# Patient Record
Sex: Female | Born: 1970 | Race: Black or African American | Hispanic: No | Smoking: Never smoker
Health system: Southern US, Community
[De-identification: ages and names within clinical notes are randomized; demographics above are authoritative.]

## PROBLEM LIST (undated history)

## (undated) DIAGNOSIS — Z87440 Personal history of urinary (tract) infections: Secondary | ICD-10-CM

## (undated) DIAGNOSIS — Z8742 Personal history of other diseases of the female genital tract: Secondary | ICD-10-CM

## (undated) DIAGNOSIS — I10 Essential (primary) hypertension: Secondary | ICD-10-CM

## (undated) DIAGNOSIS — R112 Nausea with vomiting, unspecified: Secondary | ICD-10-CM

## (undated) DIAGNOSIS — B009 Herpesviral infection, unspecified: Secondary | ICD-10-CM

## (undated) DIAGNOSIS — F32A Depression, unspecified: Secondary | ICD-10-CM

## (undated) DIAGNOSIS — T7840XA Allergy, unspecified, initial encounter: Secondary | ICD-10-CM

## (undated) DIAGNOSIS — D219 Benign neoplasm of connective and other soft tissue, unspecified: Secondary | ICD-10-CM

## (undated) DIAGNOSIS — N762 Acute vulvitis: Secondary | ICD-10-CM

## (undated) DIAGNOSIS — E785 Hyperlipidemia, unspecified: Secondary | ICD-10-CM

## (undated) DIAGNOSIS — B379 Candidiasis, unspecified: Secondary | ICD-10-CM

## (undated) DIAGNOSIS — Z9889 Other specified postprocedural states: Secondary | ICD-10-CM

## (undated) DIAGNOSIS — B977 Papillomavirus as the cause of diseases classified elsewhere: Secondary | ICD-10-CM

## (undated) DIAGNOSIS — E119 Type 2 diabetes mellitus without complications: Secondary | ICD-10-CM

## (undated) HISTORY — DX: Herpesviral infection, unspecified: B00.9

## (undated) HISTORY — DX: Essential (primary) hypertension: I10

## (undated) HISTORY — DX: Papillomavirus as the cause of diseases classified elsewhere: B97.7

## (undated) HISTORY — DX: Personal history of urinary (tract) infections: Z87.440

## (undated) HISTORY — DX: Type 2 diabetes mellitus without complications: E11.9

## (undated) HISTORY — DX: Allergy, unspecified, initial encounter: T78.40XA

## (undated) HISTORY — DX: Hyperlipidemia, unspecified: E78.5

## (undated) HISTORY — PX: TUBAL LIGATION: SHX77

## (undated) HISTORY — DX: Acute vulvitis: N76.2

## (undated) HISTORY — DX: Candidiasis, unspecified: B37.9

## (undated) HISTORY — DX: Benign neoplasm of connective and other soft tissue, unspecified: D21.9

## (undated) HISTORY — PX: ABDOMINAL HYSTERECTOMY: SHX81

## (undated) HISTORY — DX: Personal history of other diseases of the female genital tract: Z87.42

---

## 2003-08-27 DIAGNOSIS — D219 Benign neoplasm of connective and other soft tissue, unspecified: Secondary | ICD-10-CM

## 2003-08-27 HISTORY — DX: Benign neoplasm of connective and other soft tissue, unspecified: D21.9

## 2003-08-27 HISTORY — PX: MYOMECTOMY: SHX85

## 2007-08-27 DIAGNOSIS — B009 Herpesviral infection, unspecified: Secondary | ICD-10-CM

## 2007-08-27 HISTORY — PX: CRYOTHERAPY: SHX1416

## 2007-08-27 HISTORY — DX: Herpesviral infection, unspecified: B00.9

## 2010-06-19 DIAGNOSIS — B379 Candidiasis, unspecified: Secondary | ICD-10-CM

## 2010-06-19 HISTORY — DX: Candidiasis, unspecified: B37.9

## 2011-01-16 DIAGNOSIS — Z8742 Personal history of other diseases of the female genital tract: Secondary | ICD-10-CM

## 2011-01-16 HISTORY — DX: Personal history of other diseases of the female genital tract: Z87.42

## 2011-02-04 DIAGNOSIS — N762 Acute vulvitis: Secondary | ICD-10-CM

## 2011-02-04 HISTORY — DX: Acute vulvitis: N76.2

## 2011-05-06 DIAGNOSIS — Z87440 Personal history of urinary (tract) infections: Secondary | ICD-10-CM

## 2011-05-06 HISTORY — DX: Personal history of urinary (tract) infections: Z87.440

## 2011-11-27 ENCOUNTER — Other Ambulatory Visit: Payer: Self-pay | Admitting: Internal Medicine

## 2011-11-27 DIAGNOSIS — Z1231 Encounter for screening mammogram for malignant neoplasm of breast: Secondary | ICD-10-CM

## 2011-12-18 ENCOUNTER — Ambulatory Visit (INDEPENDENT_AMBULATORY_CARE_PROVIDER_SITE_OTHER): Payer: Commercial Managed Care - PPO | Admitting: Obstetrics and Gynecology

## 2011-12-18 ENCOUNTER — Ambulatory Visit: Payer: Self-pay

## 2011-12-18 ENCOUNTER — Encounter: Payer: Self-pay | Admitting: Obstetrics and Gynecology

## 2011-12-18 VITALS — BP 100/62 | HR 72 | Resp 16 | Ht 66.0 in | Wt 156.0 lb

## 2011-12-18 DIAGNOSIS — N920 Excessive and frequent menstruation with regular cycle: Secondary | ICD-10-CM

## 2011-12-18 DIAGNOSIS — N921 Excessive and frequent menstruation with irregular cycle: Secondary | ICD-10-CM

## 2011-12-18 DIAGNOSIS — D250 Submucous leiomyoma of uterus: Secondary | ICD-10-CM

## 2011-12-18 LAB — TSH: TSH: 1.047 u[IU]/mL (ref 0.350–4.500)

## 2011-12-18 LAB — CBC
Hemoglobin: 14.2 g/dL (ref 12.0–15.0)
MCV: 91.5 fL (ref 78.0–100.0)
Platelets: 286 10*3/uL (ref 150–400)
RBC: 4.6 MIL/uL (ref 3.87–5.11)
WBC: 2.9 10*3/uL — ABNORMAL LOW (ref 4.0–10.5)

## 2011-12-18 MED ORDER — VALACYCLOVIR HCL 500 MG PO TABS: 500.0000 mg | ORAL_TABLET | Freq: Two times a day (BID) | ORAL | Status: AC

## 2011-12-18 MED ORDER — MEDROXYPROGESTERONE ACETATE 10 MG PO TABS: 10.0000 mg | ORAL_TABLET | Freq: Every day | ORAL | Status: DC

## 2011-12-18 NOTE — Patient Instructions (Signed)
Patient Education Materials to be provided at check out (*indicates is located in accordion folder):  Endometrial Ablation and Hysterectomy

## 2011-12-18 NOTE — Progress Notes (Signed)
Addended by: Osborn Coho on: 12/18/2011 10:30 AM   Modules accepted: Orders

## 2011-12-18 NOTE — Progress Notes (Addendum)
C/o 2 cycles this month.  This is the first time this has happened.  She had c/o menorrhagia last yr and sonohyst showed 2 small submucosal fibroids.  I discussed doing Em Bx today to finish w/u and will repeat labs.  PSH C/S x2.  Filed Vitals:   12/18/11 0900  BP: 100/62  Pulse: 72  Resp: 16   Ext genitalia wnl Nl size uterus No palpable masses EmBx sound to 8cm (performed per protocol) pipelle passed x 3  A/P Em Bx today per protocol Pap done Check tsh and cbc Options reviewed.  Pt wants TL[facility] but considering ablation. Schedule [facility] Provera qd for cycle control Refill Valtrex pt request

## 2011-12-19 LAB — PAP IG W/ RFLX HPV ASCU

## 2011-12-26 ENCOUNTER — Telehealth: Payer: Self-pay | Admitting: Obstetrics and Gynecology

## 2012-01-16 ENCOUNTER — Other Ambulatory Visit: Payer: Self-pay | Admitting: Obstetrics and Gynecology

## 2012-01-16 ENCOUNTER — Telehealth: Payer: Self-pay | Admitting: Obstetrics and Gynecology

## 2012-01-16 NOTE — Telephone Encounter (Signed)
[facility] Cystoscopy Scheduled for 03/16/12 @ 12:30 with AR/ND. [employer] effective 10/24/10. Plan pays 80/20 After a $1,000 deductible.  Pre-op due $339.65  -Yvonne [name]

## 2012-03-06 ENCOUNTER — Encounter: Payer: Commercial Managed Care - PPO | Admitting: Obstetrics and Gynecology

## 2012-03-10 ENCOUNTER — Encounter (HOSPITAL_COMMUNITY)
Admission: RE | Admit: 2012-03-10 | Discharge: 2012-03-10 | Disposition: A | Discharge status: Home / Self Care | Payer: 59 | Source: Ambulatory Visit | Attending: Obstetrics and Gynecology | Admitting: Obstetrics and Gynecology

## 2012-03-10 ENCOUNTER — Encounter (HOSPITAL_COMMUNITY): Payer: Self-pay

## 2012-03-10 DIAGNOSIS — Z01818 Encounter for other preprocedural examination: Secondary | ICD-10-CM | POA: Insufficient documentation

## 2012-03-10 DIAGNOSIS — Z01812 Encounter for preprocedural laboratory examination: Secondary | ICD-10-CM | POA: Insufficient documentation

## 2012-03-10 HISTORY — DX: Other specified postprocedural states: R11.2

## 2012-03-10 HISTORY — DX: Nausea with vomiting, unspecified: R11.2

## 2012-03-10 HISTORY — DX: Nausea with vomiting, unspecified: Z98.890

## 2012-03-10 LAB — SURGICAL PCR SCREEN
MRSA, PCR: NEGATIVE
Staphylococcus aureus: NEGATIVE

## 2012-03-10 LAB — CBC
HCT: 42.6 % (ref 36.0–46.0)
Hemoglobin: 14.4 g/dL (ref 12.0–15.0)
MCHC: 33.8 g/dL (ref 30.0–36.0)
RBC: 4.76 MIL/uL (ref 3.87–5.11)
WBC: 4.9 10*3/uL (ref 4.0–10.5)

## 2012-03-10 NOTE — Patient Instructions (Addendum)
YOUR PROCEDURE IS SCHEDULED ON:03/16/12  ENTER THROUGH THE MAIN ENTRANCE OF [facility] AT:11AM  USE DESK PHONE AND DIAL [phone] TO INFORM us OF YOUR ARRIVAL  CALL [phone] IF YOU HAVE ANY QUESTIONS OR PROBLEMS PRIOR TO YOUR ARRIVAL.  REMEMBER: DO NOT EAT AFTER MIDNIGHT :SUNDAY   SPECIAL INSTRUCTIONS:CLEAR LIQUIDS OK UNTIL 0830 AM MONDAY   YOU MAY BRUSH YOUR TEETH THE MORNING OF SURGERY   TAKE THESE MEDICINES THE DAY OF SURGERY WITH SIP OF WATER:NONE   DO NOT WEAR JEWELRY, EYE MAKEUP, LIPSTICK OR DARK FINGERNAIL POLISH DO NOT WEAR LOTIONS  DO NOT SHAVE FOR 48 HOURS PRIOR TO SURGERY  YOU WILL NOT BE ALLOWED TO DRIVE YOURSELF HOME.

## 2012-03-11 ENCOUNTER — Encounter: Payer: Self-pay | Admitting: Obstetrics and Gynecology

## 2012-03-11 ENCOUNTER — Ambulatory Visit (INDEPENDENT_AMBULATORY_CARE_PROVIDER_SITE_OTHER): Payer: 59 | Admitting: Obstetrics and Gynecology

## 2012-03-11 VITALS — HR 64 | Temp 98.9°F | Resp 14 | Ht 65.0 in | Wt 154.0 lb

## 2012-03-11 DIAGNOSIS — Z01818 Encounter for other preprocedural examination: Secondary | ICD-10-CM

## 2012-03-11 DIAGNOSIS — N920 Excessive and frequent menstruation with regular cycle: Secondary | ICD-10-CM

## 2012-03-11 DIAGNOSIS — D250 Submucous leiomyoma of uterus: Secondary | ICD-10-CM

## 2012-03-11 DIAGNOSIS — N921 Excessive and frequent menstruation with irregular cycle: Secondary | ICD-10-CM

## 2012-03-11 MED ORDER — AZITHROMYCIN 250 MG PO TABS: ORAL_TABLET | ORAL | Status: AC

## 2012-03-11 NOTE — Progress Notes (Signed)
Yvonne [name] is a 41 y.o. female G2P2 who presents for a total laparoscopic hysterectomy because of menometrorrhagia and submucosal fibroids. Patient has a long history of symptomatic uterine fibroids and underwent an abdominal myomectomy in 2005.  Over the past year patient reports vaginal bleeding twice a month that only allow about one week without a flow.  During these episodes she may change a pad once or every 3 hours (using a super plus tampon).  She admits to cramping rated at a 7/10 but finds relief (3/10) with Midol or Aleve.  She admits to occasional positional dyspareunia but denies vaginitis symptoms, changes in urinary or bowel habits or dizziness.  A CBC and TSH done July /2013 were both normal.  An endometrial biopsy in April 2013 returned benign findings with no hyperplasia, atypia or malignancy.  A year ago patient had an ultrasound and sono-hysterogram that showed: the uterus measuring 8.47 X space 4.93X space 4.35 space cm with 2 submucosal fibroids measuring 1.05 cm and 0.91 cm respectively.  Both of the patient's ovaries appeared normal on this study and at that time an IUD was visible within the endometrial cavity.  A review of both medical and surgical management options were given to the patient regarding her symptoms. Given that she has completed her desire to bear children and the disruptive nature of her symptoms she has consented to proceed with hysterectomy.  Past Medical History  OB History: G2P2;  C-section 2006 & 2009  GYN History: menarche 41 YO   LMP  03/02/12   Contracepton-Tubal Sterilization;  Has a history of HSV-2 and HPV; Underwent cryotherapy in 2010 for abnormal PAP smear but they have been normal since.   Last PAP smear  2011  Medical History: migraines, fibroids  Surgical History: 2005 Myomectomy; 2009 Tubal Sterilization Admits to severe nausea and tremors with anesthesia  but no history of blood transfusions  Family History: diabetes mellitus,  hypertension  Social History: Divorced, works part-time with [employer];  Occasional alcohol consumption but denies tobacco or illicit drugs   Outpatient Encounter Prescriptions as of 03/11/2012  Medication Sig Dispense Refill  . niacin-simvastatin (SIMCOR) 500-20 MG 24 hr tablet Take 1 tablet by mouth at bedtime.        Allergies  Allergen Reactions  . Sulfa Antibiotics Itching   Denies sensitivity to latex, peanuts, shellfish, soy or adhesives.  ROS: + glasses for reading, current disruptive cough (following a recent sinus infection) that is "wet" but not productive,  + thick  post nasal drainage but  denies headache, vision changes, dysphagia, tinnitus, dizziness,  chest pain, shortness of breath, nausea, vomiting, diarrhea, dysuria, hematuria, pelvic pain, swelling of joints,easy bruising,  myalgias, arthralgias, skin rashes and except as is mentioned in the history of present illness, patient's review of systems is otherwise negative   Physical Exam    BP 112/70  Pulse 64  Temp 98.9 F (37.2 C)  Resp 14  Ht 5\' 5"  (1.651 m)  Wt 154 lb (69.854 kg)  BMI 25.63 kg/m2  LMP 03/02/2012  Neck: supple without masses or thyromegaly Lungs: clear to auscultation but coarse breath sounds Heart: regular rate and rhythm Abdomen: soft, non-tender and no organomegaly Pelvic:EGBUS- wnl; vagina-normal rugae; uterus-normal size, cervix without lesions or motion tenderness; adnexae-no tenderness or masses Extremities:  no clubbing, cyanosis or edema   Assesment: Menometrorrhagia                     Submucosal Fibroid   Disposition:  A discussion was held with patient regarding the indication for her procedure(s) along with the risks, which include but are not limited to: reaction to anesthesia, damage to adjacent organs, infection,  excessive bleeding, early menopause, pelvic prolapse and possible need for an open abdominal incision.  A Miralax bowel prep was given to patient to  complete 24 hours prior to surgery.  Patient verbalized understanding of her risks, along with the procedure and preoperative instructions given.  She has consented to proceed to a Total Laparoscopic Hysterectomy with Cystoscopy at [facility] March 16, 2012 @ 12:30 p.m   CSN# [id number]   Marquis Lunch. Lowell Guitar, PA-C  for Dr. Su Hilt   Addendum:  Patient was given Z-Pak  #6 2 po stat then 1 po qd until completed for her cough and advised to increase liquids and use saline nasal spray to help decrease the tenacity of her post nasal drip.

## 2012-03-11 NOTE — H&P (Signed)
Yvonne [name] is a 41 y.o. female G2P2 who presents for a total laparoscopic hysterectomy because of menometrorrhagia and submucosal fibroids. Patient has a long history of symptomatic uterine fibroids and underwent an abdominal myomectomy in 2005.  Over the past year patient reports vaginal bleeding twice a month that only allow about one week without a flow.  During these episodes she may change a pad once or every 3 hours (using a super plus tampon).  She admits to cramping rated at a 7/10 but finds relief (3/10) with Midol or Aleve.  She admits to occasional positional dyspareunia but denies vaginitis symptoms, changes in urinary or bowel habits or dizziness.  A CBC and TSH done July /2013 were both normal.  An endometrial biopsy in April 2013 returned benign findings with no hyperplasia, atypia or malignancy.  A year ago patient had an ultrasound and sono-hysterogram that showed: the uterus measuring 8.47 X space 4.93X space 4.35 space cm with 2 submucosal fibroids measuring 1.05 cm and 0.91 cm respectively.  Both of the patient's ovaries appeared normal on this study and at that time an IUD was visible within the endometrial cavity.  A review of both medical and surgical management options were given to the patient regarding her symptoms. Given that she has completed her desire to bear children and the disruptive nature of her symptoms she has consented to proceed with hysterectomy.  Past Medical History  OB History: G2P2;  C-section 2006 & 2009  GYN History: menarche 41 YO   LMP  03/02/12   Contracepton-Tubal Sterilization;  Has a history of HSV-2 and HPV; Underwent cryotherapy in 2010 for abnormal PAP smear but they have been normal since.   Last PAP smear  2011  Medical History: migraines, fibroids  Surgical History: 2005 Myomectomy; 2009 Tubal Sterilization Admits to severe nausea and tremors with anesthesia  but no history of blood transfusions  Family History: diabetes mellitus,  hypertension  Social History: Divorced, works part-time with [employer];  Occasional alcohol consumption but denies tobacco or illicit drugs   Outpatient Encounter Prescriptions as of 03/11/2012  Medication Sig Dispense Refill  . niacin-simvastatin (SIMCOR) 500-20 MG 24 hr tablet Take 1 tablet by mouth at bedtime.        Allergies  Allergen Reactions  . Sulfa Antibiotics Itching   Denies sensitivity to latex, peanuts, shellfish, soy or adhesives.  ROS: + glasses for reading, current disruptive cough (following a recent sinus infection) that is "wet" but not productive,  + thick  post nasal drainage but  denies headache, vision changes, dysphagia, tinnitus, dizziness,  chest pain, shortness of breath, nausea, vomiting, diarrhea, dysuria, hematuria, pelvic pain, swelling of joints,easy bruising,  myalgias, arthralgias, skin rashes and except as is mentioned in the history of present illness, patient's review of systems is otherwise negative   Physical Exam    BP 112/70  Pulse 64  Temp 98.9 F (37.2 C)  Resp 14  Ht 5\' 5"  (1.651 m)  Wt 154 lb (69.854 kg)  BMI 25.63 kg/m2  LMP 03/02/2012  Neck: supple without masses or thyromegaly Lungs: clear to auscultation but coarse breath sounds Heart: regular rate and rhythm Abdomen: soft, non-tender and no organomegaly Pelvic:EGBUS- wnl; vagina-normal rugae; uterus-normal size, cervix without lesions or motion tenderness; adnexae-no tenderness or masses Extremities:  no clubbing, cyanosis or edema   Assesment: Menometrorrhagia                     Submucosal Fibroid   Disposition:  A discussion was held with patient regarding the indication for her procedure(s) along with the risks, which include but are not limited to: reaction to anesthesia, damage to adjacent organs, infection,  excessive bleeding, early menopause, pelvic prolapse and possible need for an open abdominal incision.  A Miralax bowel prep was given to patient to  complete 24 hours prior to surgery.  Patient verbalized understanding of her risks, along with the procedure and preoperative instructions given.  She has consented to proceed to a Total Laparoscopic Hysterectomy with Cystoscopy at [facility] March 16, 2012 @ 12:30 p.m   CSN# [id number]   Marquis Lunch. Lowell Guitar, PA-C for Dr. Purcell Nails, M.D.

## 2012-03-14 ENCOUNTER — Telehealth: Payer: Self-pay | Admitting: Obstetrics and Gynecology

## 2012-03-14 NOTE — Telephone Encounter (Signed)
Will notify Dr.Roberts Lavera Guise, CNM

## 2012-03-15 MED ORDER — DEXTROSE 5 % IV SOLN
2.0000 g | INTRAVENOUS | Status: AC
  Filled 2012-03-15: qty 2

## 2012-03-24 ENCOUNTER — Telehealth: Payer: Self-pay | Admitting: Obstetrics and Gynecology

## 2012-03-24 ENCOUNTER — Other Ambulatory Visit: Payer: Self-pay | Admitting: Nurse Practitioner

## 2012-03-24 DIAGNOSIS — M79606 Pain in leg, unspecified: Secondary | ICD-10-CM

## 2012-03-24 NOTE — Telephone Encounter (Signed)
[facility] Cystoscopy Scheduled for 06/15/12 @ 9:30 with AR/ND. [employer] effective 10/24/10. Plan pays 80/20 After a $1,000 deductible.  Pre-op due $339.65  -Adrianne Pridgen

## 2012-03-26 ENCOUNTER — Ambulatory Visit
Admission: RE | Admit: 2012-03-26 | Discharge: 2012-03-26 | Disposition: A | Discharge status: Home / Self Care | Payer: 59 | Source: Ambulatory Visit | Attending: Nurse Practitioner | Admitting: Nurse Practitioner

## 2012-03-26 DIAGNOSIS — M79606 Pain in leg, unspecified: Secondary | ICD-10-CM

## 2012-04-16 ENCOUNTER — Other Ambulatory Visit: Payer: Self-pay | Admitting: Obstetrics and Gynecology

## 2012-04-23 ENCOUNTER — Encounter: Payer: Commercial Managed Care - PPO | Admitting: Obstetrics and Gynecology

## 2012-05-20 ENCOUNTER — Telehealth: Payer: Self-pay | Admitting: Obstetrics and Gynecology

## 2012-06-02 ENCOUNTER — Other Ambulatory Visit: Payer: Self-pay

## 2012-06-02 NOTE — Telephone Encounter (Signed)
Fax refill okd ld

## 2012-06-08 ENCOUNTER — Encounter (HOSPITAL_COMMUNITY)
Admission: RE | Admit: 2012-06-08 | Discharge: 2012-06-08 | Disposition: A | Discharge status: Home / Self Care | Payer: 59 | Source: Ambulatory Visit | Attending: Obstetrics and Gynecology | Admitting: Obstetrics and Gynecology

## 2012-06-08 ENCOUNTER — Encounter (HOSPITAL_COMMUNITY): Payer: Self-pay

## 2012-06-08 ENCOUNTER — Encounter (INDEPENDENT_AMBULATORY_CARE_PROVIDER_SITE_OTHER): Payer: 59 | Admitting: Obstetrics and Gynecology

## 2012-06-08 LAB — CBC
Hemoglobin: 13.7 g/dL (ref 12.0–15.0)
MCH: 30.1 pg (ref 26.0–34.0)
MCHC: 33.7 g/dL (ref 30.0–36.0)
MCV: 89.5 fL (ref 78.0–100.0)

## 2012-06-08 LAB — SURGICAL PCR SCREEN: MRSA, PCR: INVALID — AB

## 2012-06-08 NOTE — Patient Instructions (Addendum)
Your procedure is scheduled on:06/15/12  Enter through the Main Entrance at :0800 am Pick up desk phone and dial [phone] and inform us of your arrival.  Please call [phone] if you have any problems the morning of surgery.  Remember: Do not eat after midnight:Sunday  Do not drink after:5:30 am Monday  Take these meds the morning of surgery with a sip of water:none  DO NOT wear jewelry, eye make-up, lipstick,body lotion, or dark fingernail polish. Do not shave for 48 hours prior to surgery.  If you are to be admitted after surgery, leave suitcase in car until your room has been assigned. Patients discharged on the day of surgery will not be allowed to drive home.   Remember to use your Hibiclens as instructed.

## 2012-06-10 NOTE — H&P (Signed)
Yvonne [name] is a 41 y.o. female G2P2 who presents for a total laparoscopic hysterectomy because of menometrorrhagia and submucosal fibroids. Patient has a long history of symptomatic uterine fibroids and underwent an abdominal myomectomy in 2005. Over the past year patient reports vaginal bleeding twice a month that only allow about one week without a flow. During these episodes she may change a pad once or every 3 hours (using a super plus tampon). She admits to cramping rated at a 7/10 but finds relief (3/10) with Midol or Aleve. She admits to occasional positional dyspareunia but denies vaginitis symptoms, changes in urinary or bowel habits or dizziness. A CBC and TSH done July /2013 were both normal. An endometrial biopsy in April 2013 returned benign findings with no hyperplasia, atypia or malignancy. A year ago patient had an ultrasound and sono-hysterogram that showed: the uterus measuring 8.47 X space 4.93X space 4.35 space cm with 2 submucosal fibroids measuring 1.05 cm and 0.91 cm respectively. Both of the patient's ovaries appeared normal on this study and at that time an IUD was visible within the endometrial cavity.  A review of both medical and surgical management options were given to the patient regarding her symptoms. Given that she has completed her desire to bear children and the disruptive nature of her symptoms she has consented to proceed with hysterectomy.    Past Medical History   OB History: G2P2; C-section 2006 & 2009   GYN History: menarche 41 YO LMP 03/02/12 Contracepton-Tubal Sterilization; Has a history of HSV-2 and HPV; Underwent cryotherapy in 2010 for abnormal PAP smear but they have been normal since. Last PAP smear 2011   Medical History: migraines, fibroids   Surgical History: 2005 Myomectomy; 2009 Tubal Sterilization  Admits to severe nausea and tremors with anesthesia but no history of blood transfusions   Family History: diabetes mellitus, hypertension    Social History: Divorced, works part-time with Medicaid Billing; Occasional alcohol consumption but denies tobacco or illicit drugs      Outpatient Encounter Prescriptions as of 06/10/2012     Medication  Sig  Dispense  Refill     .  niacin-simvastatin (SIMCOR) 500-20 MG 24 hr tablet  Take 1 tablet by mouth at bedtime.       Allergies     Allergen  Reactions     .  Sulfa Antibiotics  Itching     Denies sensitivity to latex, peanuts, shellfish, soy or adhesives.   ROS: + glasses for reading, current disruptive cough (following a recent sinus infection) that is "wet" but not productive, + thick post nasal drainage but denies headache, vision changes, dysphagia, tinnitus, dizziness, chest pain, shortness of breath, nausea, vomiting, diarrhea, dysuria, hematuria, pelvic pain, swelling of joints,easy bruising, myalgias, arthralgias, skin rashes and except as is mentioned in the history of present illness, patient's review of systems is otherwise negative   Physical Exam  BP 112/70  Pulse 64  Temp 98.9 F (37.2 C)  Resp 14  Ht 5' 5" (1.651 m)  Wt 154 lb (69.854 kg)  BMI 25.63 kg/m2  LMP 03/02/2012  Neck: supple without masses or thyromegaly  Lungs: clear to auscultation but coarse breath sounds  Heart: regular rate and rhythm  Abdomen: soft, non-tender and no organomegaly  Pelvic:EGBUS- wnl; vagina-normal rugae; uterus-normal size, cervix without lesions or motion tenderness; adnexae-no tenderness or masses  Extremities: no clubbing, cyanosis or edema    Assesment: Menometrorrhagia  Submucosal Fibroid   Disposition: A discussion was held with patient regarding   the indication for her procedure(s) along with the risks, which include but are not limited to: reaction to anesthesia, damage to adjacent organs, infection, excessive bleeding, early menopause, pelvic prolapse and possible need for an open abdominal incision. A Miralax bowel prep was given to patient to complete 24 hours prior  to surgery. Patient verbalized understanding of her risks, along with the procedure and preoperative instructions given. She has consented to proceed to a Total Laparoscopic Hysterectomy with Cystoscopy at [facility] June 15, 2012 at 9:15 a.m.   CSN# [id number]  Kapena Hamme J. Zuley Lutter, PA-C for Dr. Angela Y Roberts, M.D.         

## 2012-06-10 NOTE — Progress Notes (Signed)
error 

## 2012-06-11 LAB — MRSA CULTURE

## 2012-06-15 ENCOUNTER — Encounter (HOSPITAL_COMMUNITY)
Admission: RE | Disposition: A | Discharge status: Home / Self Care | Payer: Self-pay | Source: Ambulatory Visit | Attending: Obstetrics and Gynecology

## 2012-06-15 ENCOUNTER — Encounter (HOSPITAL_COMMUNITY): Payer: Self-pay | Admitting: *Deleted

## 2012-06-15 ENCOUNTER — Ambulatory Visit (HOSPITAL_COMMUNITY)
Admission: RE | Admit: 2012-06-15 | Discharge: 2012-06-16 | Disposition: A | Discharge status: Home / Self Care | Payer: 59 | Source: Ambulatory Visit | Attending: Obstetrics and Gynecology | Admitting: Obstetrics and Gynecology

## 2012-06-15 ENCOUNTER — Encounter (HOSPITAL_COMMUNITY): Payer: Self-pay | Admitting: Anesthesiology

## 2012-06-15 ENCOUNTER — Ambulatory Visit (HOSPITAL_COMMUNITY): Payer: 59 | Admitting: Anesthesiology

## 2012-06-15 DIAGNOSIS — N8000 Endometriosis of the uterus, unspecified: Secondary | ICD-10-CM | POA: Insufficient documentation

## 2012-06-15 DIAGNOSIS — D251 Intramural leiomyoma of uterus: Secondary | ICD-10-CM | POA: Insufficient documentation

## 2012-06-15 DIAGNOSIS — N736 Female pelvic peritoneal adhesions (postinfective): Secondary | ICD-10-CM

## 2012-06-15 DIAGNOSIS — N920 Excessive and frequent menstruation with regular cycle: Secondary | ICD-10-CM | POA: Insufficient documentation

## 2012-06-15 DIAGNOSIS — Z01818 Encounter for other preprocedural examination: Secondary | ICD-10-CM | POA: Insufficient documentation

## 2012-06-15 DIAGNOSIS — Z01812 Encounter for preprocedural laboratory examination: Secondary | ICD-10-CM | POA: Insufficient documentation

## 2012-06-15 DIAGNOSIS — D250 Submucous leiomyoma of uterus: Secondary | ICD-10-CM | POA: Insufficient documentation

## 2012-06-15 HISTORY — PX: CYSTOSCOPY: SHX5120

## 2012-06-15 HISTORY — PX: LAPAROSCOPIC HYSTERECTOMY: SHX1926

## 2012-06-15 LAB — BASIC METABOLIC PANEL
CO2: 22 mEq/L (ref 19–32)
Calcium: 8.6 mg/dL (ref 8.4–10.5)
Chloride: 104 mEq/L (ref 96–112)
Potassium: 3.9 mEq/L (ref 3.5–5.1)
Sodium: 136 mEq/L (ref 135–145)

## 2012-06-15 LAB — HCG, SERUM, QUALITATIVE: Preg, Serum: NEGATIVE

## 2012-06-15 LAB — CBC
MCHC: 34.6 g/dL (ref 30.0–36.0)
Platelets: 210 10*3/uL (ref 150–400)
RDW: 11.7 % (ref 11.5–15.5)

## 2012-06-15 SURGERY — HYSTERECTOMY, TOTAL, LAPAROSCOPIC
Anesthesia: General | Site: Bladder | Wound class: Clean Contaminated

## 2012-06-15 MED ORDER — KETOROLAC TROMETHAMINE 30 MG/ML IJ SOLN
INTRAMUSCULAR | Status: AC
  Filled 2012-06-15: qty 1

## 2012-06-15 MED ORDER — INFLUENZA VIRUS VACC SPLIT PF IM SUSP
0.5000 mL | INTRAMUSCULAR | Status: AC
  Administered 2012-06-16: 0.5 mL via INTRAMUSCULAR
  Filled 2012-06-15: qty 0.5

## 2012-06-15 MED ORDER — ROCURONIUM BROMIDE 50 MG/5ML IV SOLN
INTRAVENOUS | Status: AC
  Filled 2012-06-15: qty 1

## 2012-06-15 MED ORDER — ROCURONIUM BROMIDE 100 MG/10ML IV SOLN
INTRAVENOUS | Status: DC | PRN
  Administered 2012-06-15: 20 mg via INTRAVENOUS
  Administered 2012-06-15: 10 mg via INTRAVENOUS
  Administered 2012-06-15: 50 mg via INTRAVENOUS

## 2012-06-15 MED ORDER — HYDROMORPHONE 0.3 MG/ML IV SOLN
INTRAVENOUS | Status: DC
  Administered 2012-06-15: 17:00:00 via INTRAVENOUS
  Administered 2012-06-15: 1.19 mg via INTRAVENOUS
  Administered 2012-06-16: 0.599 mg via INTRAVENOUS
  Filled 2012-06-15: qty 25

## 2012-06-15 MED ORDER — BUPIVACAINE HCL (PF) 0.25 % IJ SOLN
INTRAMUSCULAR | Status: DC | PRN
  Administered 2012-06-15: 19 mL

## 2012-06-15 MED ORDER — NEOSTIGMINE METHYLSULFATE 1 MG/ML IJ SOLN
INTRAMUSCULAR | Status: DC | PRN
  Administered 2012-06-15: 3 mg via INTRAVENOUS

## 2012-06-15 MED ORDER — STERILE WATER FOR IRRIGATION IR SOLN
Status: DC | PRN
  Administered 2012-06-15: 1000 mL via INTRAVESICAL

## 2012-06-15 MED ORDER — SCOPOLAMINE 1 MG/3DAYS TD PT72
MEDICATED_PATCH | TRANSDERMAL | Status: AC
  Administered 2012-06-15: 1.5 mg via TRANSDERMAL
  Filled 2012-06-15: qty 1

## 2012-06-15 MED ORDER — HYDROMORPHONE HCL PF 1 MG/ML IJ SOLN
0.2500 mg | INTRAMUSCULAR | Status: DC | PRN
  Administered 2012-06-15: 0.5 mg via INTRAVENOUS

## 2012-06-15 MED ORDER — LACTATED RINGERS IV SOLN
INTRAVENOUS | Status: DC
  Administered 2012-06-15: 125 mL/h via INTRAVENOUS
  Administered 2012-06-15 (×3): via INTRAVENOUS

## 2012-06-15 MED ORDER — METOCLOPRAMIDE HCL 5 MG/ML IJ SOLN
INTRAMUSCULAR | Status: AC
  Administered 2012-06-15: 10 mg via INTRAVENOUS
  Filled 2012-06-15: qty 2

## 2012-06-15 MED ORDER — SODIUM CHLORIDE 0.9 % IJ SOLN: 9.0000 mL | INTRAMUSCULAR | Status: DC | PRN

## 2012-06-15 MED ORDER — KETOROLAC TROMETHAMINE 30 MG/ML IJ SOLN
30.0000 mg | Freq: Four times a day (QID) | INTRAMUSCULAR | Status: DC
  Administered 2012-06-15: 30 mg via INTRAVENOUS

## 2012-06-15 MED ORDER — PROPOFOL 10 MG/ML IV EMUL
INTRAVENOUS | Status: AC
  Filled 2012-06-15: qty 20

## 2012-06-15 MED ORDER — FENTANYL CITRATE 0.05 MG/ML IJ SOLN
INTRAMUSCULAR | Status: DC | PRN
  Administered 2012-06-15 (×3): 100 ug via INTRAVENOUS
  Administered 2012-06-15 (×2): 50 ug via INTRAVENOUS

## 2012-06-15 MED ORDER — BUPIVACAINE HCL (PF) 0.25 % IJ SOLN
INTRAMUSCULAR | Status: AC
  Filled 2012-06-15: qty 30

## 2012-06-15 MED ORDER — HYDROMORPHONE HCL PF 1 MG/ML IJ SOLN
INTRAMUSCULAR | Status: AC
  Filled 2012-06-15: qty 1

## 2012-06-15 MED ORDER — FENTANYL CITRATE 0.05 MG/ML IJ SOLN
INTRAMUSCULAR | Status: AC
  Filled 2012-06-15: qty 5

## 2012-06-15 MED ORDER — LIDOCAINE HCL (CARDIAC) 20 MG/ML IV SOLN
INTRAVENOUS | Status: AC
  Filled 2012-06-15: qty 5

## 2012-06-15 MED ORDER — NALOXONE HCL 0.4 MG/ML IJ SOLN: 0.4000 mg | INTRAMUSCULAR | Status: DC | PRN

## 2012-06-15 MED ORDER — HYDROMORPHONE HCL PF 1 MG/ML IJ SOLN
INTRAMUSCULAR | Status: DC | PRN
  Administered 2012-06-15 (×2): 1 mg via INTRAVENOUS

## 2012-06-15 MED ORDER — DIPHENHYDRAMINE HCL 12.5 MG/5ML PO ELIX
12.5000 mg | ORAL_SOLUTION | Freq: Four times a day (QID) | ORAL | Status: DC | PRN
  Administered 2012-06-15: 12.5 mg via ORAL
  Filled 2012-06-15: qty 5

## 2012-06-15 MED ORDER — CEFAZOLIN SODIUM-DEXTROSE 2-3 GM-% IV SOLR
INTRAVENOUS | Status: AC
  Filled 2012-06-15: qty 50

## 2012-06-15 MED ORDER — DEXAMETHASONE SODIUM PHOSPHATE 10 MG/ML IJ SOLN
INTRAMUSCULAR | Status: AC
  Filled 2012-06-15: qty 1

## 2012-06-15 MED ORDER — MIDAZOLAM HCL 5 MG/5ML IJ SOLN
INTRAMUSCULAR | Status: DC | PRN
  Administered 2012-06-15: 2 mg via INTRAVENOUS

## 2012-06-15 MED ORDER — LACTATED RINGERS IV BOLUS (SEPSIS)
500.0000 mL | Freq: Once | INTRAVENOUS | Status: AC
  Administered 2012-06-15: 500 mL via INTRAVENOUS

## 2012-06-15 MED ORDER — DIPHENHYDRAMINE HCL 50 MG/ML IJ SOLN: 12.5000 mg | Freq: Once | INTRAMUSCULAR | Status: DC

## 2012-06-15 MED ORDER — SCOPOLAMINE 1 MG/3DAYS TD PT72
1.0000 | MEDICATED_PATCH | TRANSDERMAL | Status: DC
  Administered 2012-06-15: 1.5 mg via TRANSDERMAL

## 2012-06-15 MED ORDER — DIPHENHYDRAMINE HCL 50 MG/ML IJ SOLN
INTRAMUSCULAR | Status: AC
  Administered 2012-06-15: 12.5 mg via INTRAVENOUS
  Filled 2012-06-15: qty 1

## 2012-06-15 MED ORDER — INDIGOTINDISULFONATE SODIUM 8 MG/ML IJ SOLN
INTRAMUSCULAR | Status: AC
  Filled 2012-06-15: qty 5

## 2012-06-15 MED ORDER — ONDANSETRON HCL 4 MG/2ML IJ SOLN
INTRAMUSCULAR | Status: AC
  Filled 2012-06-15: qty 2

## 2012-06-15 MED ORDER — ONDANSETRON HCL 4 MG/2ML IJ SOLN
INTRAMUSCULAR | Status: DC | PRN
  Administered 2012-06-15: 4 mg via INTRAVENOUS

## 2012-06-15 MED ORDER — METOCLOPRAMIDE HCL 5 MG/ML IJ SOLN
10.0000 mg | Freq: Once | INTRAMUSCULAR | Status: AC | PRN
  Administered 2012-06-15: 10 mg via INTRAVENOUS

## 2012-06-15 MED ORDER — ONDANSETRON HCL 4 MG/2ML IJ SOLN
4.0000 mg | Freq: Four times a day (QID) | INTRAMUSCULAR | Status: DC | PRN
  Administered 2012-06-15 – 2012-06-16 (×2): 4 mg via INTRAVENOUS
  Filled 2012-06-15: qty 2

## 2012-06-15 MED ORDER — DEXAMETHASONE SODIUM PHOSPHATE 4 MG/ML IJ SOLN
INTRAMUSCULAR | Status: DC | PRN
  Administered 2012-06-15: 10 mg via INTRAVENOUS

## 2012-06-15 MED ORDER — OXYCODONE-ACETAMINOPHEN 5-325 MG PO TABS
1.0000 | ORAL_TABLET | ORAL | Status: DC | PRN
  Administered 2012-06-16: 2 via ORAL
  Filled 2012-06-15: qty 2

## 2012-06-15 MED ORDER — LIDOCAINE HCL (CARDIAC) 20 MG/ML IV SOLN
INTRAVENOUS | Status: DC | PRN
  Administered 2012-06-15: 100 mg via INTRAVENOUS

## 2012-06-15 MED ORDER — ONDANSETRON HCL 4 MG/2ML IJ SOLN
INTRAMUSCULAR | Status: AC
  Administered 2012-06-15: 4 mg via INTRAVENOUS
  Filled 2012-06-15: qty 2

## 2012-06-15 MED ORDER — PROPOFOL 10 MG/ML IV EMUL
INTRAVENOUS | Status: DC | PRN
  Administered 2012-06-15: 50 mg via INTRAVENOUS
  Administered 2012-06-15: 200 mg via INTRAVENOUS

## 2012-06-15 MED ORDER — DIPHENHYDRAMINE HCL 50 MG/ML IJ SOLN: 12.5000 mg | Freq: Four times a day (QID) | INTRAMUSCULAR | Status: DC | PRN

## 2012-06-15 MED ORDER — DIPHENHYDRAMINE HCL 50 MG/ML IJ SOLN
12.5000 mg | Freq: Once | INTRAMUSCULAR | Status: AC
  Administered 2012-06-15: 12.5 mg via INTRAVENOUS

## 2012-06-15 MED ORDER — IBUPROFEN 600 MG PO TABS
600.0000 mg | ORAL_TABLET | Freq: Four times a day (QID) | ORAL | Status: DC | PRN
  Administered 2012-06-16: 600 mg via ORAL
  Filled 2012-06-15: qty 1

## 2012-06-15 MED ORDER — INDIGOTINDISULFONATE SODIUM 8 MG/ML IJ SOLN
INTRAMUSCULAR | Status: DC | PRN
  Administered 2012-06-15: 3 mL via INTRAVENOUS

## 2012-06-15 MED ORDER — GLYCOPYRROLATE 0.2 MG/ML IJ SOLN
INTRAMUSCULAR | Status: DC | PRN
  Administered 2012-06-15: .4 mg via INTRAVENOUS

## 2012-06-15 MED ORDER — LACTATED RINGERS IV SOLN
INTRAVENOUS | Status: DC
  Administered 2012-06-15 – 2012-06-16 (×3): via INTRAVENOUS

## 2012-06-15 MED ORDER — CEFAZOLIN SODIUM-DEXTROSE 2-3 GM-% IV SOLR
2.0000 g | INTRAVENOUS | Status: AC
  Administered 2012-06-15: 2 g via INTRAVENOUS

## 2012-06-15 MED ORDER — MIDAZOLAM HCL 2 MG/2ML IJ SOLN
INTRAMUSCULAR | Status: AC
  Filled 2012-06-15: qty 2

## 2012-06-15 SURGICAL SUPPLY — 47 items
BARRIER ADHS 3X4 INTERCEED (GAUZE/BANDAGES/DRESSINGS) IMPLANT
BLADE SURG 10 STRL SS (BLADE) ×3 IMPLANT
CANISTER SUCTION 2500CC (MISCELLANEOUS) ×3 IMPLANT
CHLORAPREP W/TINT 26ML (MISCELLANEOUS) ×3 IMPLANT
CLOTH BEACON ORANGE TIMEOUT ST (SAFETY) ×3 IMPLANT
COVER MAYO STAND STRL (DRAPES) ×3 IMPLANT
COVER TABLE BACK 60X90 (DRAPES) ×3 IMPLANT
DERMABOND ADVANCED (GAUZE/BANDAGES/DRESSINGS) ×1
DERMABOND ADVANCED .7 DNX12 (GAUZE/BANDAGES/DRESSINGS) ×2 IMPLANT
DRAPE HYSTEROSCOPY (DRAPE) ×3 IMPLANT
DRAPE PROXIMA HALF (DRAPES) ×3 IMPLANT
EVACUATOR SMOKE 8.L (FILTER) ×6 IMPLANT
GLOVE BIO SURGEON STRL SZ 6.5 (GLOVE) ×6 IMPLANT
GLOVE BIO SURGEON STRL SZ7.5 (GLOVE) ×6 IMPLANT
GLOVE BIOGEL PI IND STRL 7.5 (GLOVE) ×4 IMPLANT
GLOVE BIOGEL PI INDICATOR 7.5 (GLOVE) ×2
GLOVE ECLIPSE 6.0 STRL STRAW (GLOVE) ×12 IMPLANT
GLOVE INDICATOR 7.0 STRL GRN (GLOVE) ×6 IMPLANT
GOWN PREVENTION PLUS LG XLONG (DISPOSABLE) ×6 IMPLANT
GOWN STRL REIN XL XLG (GOWN DISPOSABLE) ×6 IMPLANT
HEMOSTAT SURGICEL 2X14 (HEMOSTASIS) IMPLANT
HEMOSTAT SURGICEL 4X8 (HEMOSTASIS) ×3 IMPLANT
NEEDLE INSUFFLATION 14GA 120MM (NEEDLE) ×3 IMPLANT
NS IRRIG 1000ML POUR BTL (IV SOLUTION) ×6 IMPLANT
OCCLUDER COLPOPNEUMO (BALLOONS) ×3 IMPLANT
PACK LAPAROSCOPY BASIN (CUSTOM PROCEDURE TRAY) ×3 IMPLANT
PACK LAPAROSCOPY II (CUSTOM PROCEDURE TRAY) ×3 IMPLANT
SCALPEL HARMONIC ACE (MISCELLANEOUS) ×9 IMPLANT
SCISSORS LAP 5X35 DISP (ENDOMECHANICALS) ×3 IMPLANT
SET CYSTO W/LG BORE CLAMP LF (SET/KITS/TRAYS/PACK) ×3 IMPLANT
SET IRRIG TUBING LAPAROSCOPIC (IRRIGATION / IRRIGATOR) ×3 IMPLANT
SLEEVE Z-THREAD 5X100MM (TROCAR) ×3 IMPLANT
SUT MNCRL AB 3-0 PS2 27 (SUTURE) ×3 IMPLANT
SUT PDS AB 1 CT1 36 (SUTURE) ×15 IMPLANT
SUT VICRYL 0 UR6 27IN ABS (SUTURE) ×6 IMPLANT
SYR 50ML LL SCALE MARK (SYRINGE) ×3 IMPLANT
SYRINGE 10CC LL (SYRINGE) ×3 IMPLANT
TIP UTERINE 6.7X6CM WHT DISP (MISCELLANEOUS) ×6 IMPLANT
TIP UTERINE 6.7X8CM BLUE DISP (MISCELLANEOUS) ×3 IMPLANT
TOWEL OR 17X24 6PK STRL BLUE (TOWEL DISPOSABLE) ×9 IMPLANT
TRAY FOLEY CATH 14FR (SET/KITS/TRAYS/PACK) ×3 IMPLANT
TROCAR BALLN 12MMX100 BLUNT (TROCAR) ×3 IMPLANT
TROCAR Z-THREAD FIOS 11X100 BL (TROCAR) ×6 IMPLANT
TROCAR Z-THREAD FIOS 5X100MM (TROCAR) ×3 IMPLANT
TUBING FILTER THERMOFLATOR (ELECTROSURGICAL) ×3 IMPLANT
WARMER LAPAROSCOPE (MISCELLANEOUS) ×3 IMPLANT
WATER STERILE IRR 1000ML POUR (IV SOLUTION) ×3 IMPLANT

## 2012-06-15 NOTE — Transfer of Care (Signed)
Immediate Anesthesia Transfer of Care Note  Patient: Yvonne [name]  Procedure(s) Performed: Procedure(s) (LRB) with comments: HYSTERECTOMY TOTAL LAPAROSCOPIC (N/A) CYSTOSCOPY (N/A)  Patient Location: PACU  Anesthesia Type: General  Level of Consciousness: awake and oriented  Airway & Oxygen Therapy: Patient Spontanous Breathing and Patient connected to nasal cannula oxygen  Post-op Assessment: Report given to PACU RN and Post -op Vital signs reviewed and stable  Post vital signs: stable  Complications: No apparent anesthesia complications

## 2012-06-15 NOTE — Anesthesia Postprocedure Evaluation (Signed)
  Anesthesia Post-op Note  Patient: Yvonne [name]  Procedure(s) Performed: Procedure(s) (LRB) with comments: HYSTERECTOMY TOTAL LAPAROSCOPIC (N/A) CYSTOSCOPY (N/A)  Patient Location: Mother/Baby  Anesthesia Type: Spinal  Level of Consciousness: awake, alert  and oriented  Airway and Oxygen Therapy: Patient Spontanous Breathing  Post-op Pain: mild  Post-op Assessment: Post-op Vital signs reviewed, Patient's Cardiovascular Status Stable, No headache, No backache, No residual numbness and No residual motor weakness  Post-op Vital Signs: Reviewed and stable  Complications: No apparent anesthesia complications

## 2012-06-15 NOTE — Progress Notes (Signed)
Dr Malen Gauze at bedside to discuss continued nausea when patient is awakened for assessments.  Order received to give Benadryl 12.5 mg IV and transfer to her room.

## 2012-06-15 NOTE — Progress Notes (Signed)
Day of Surgery Procedure(s) (LRB): HYSTERECTOMY TOTAL LAPAROSCOPIC (N/A) CYSTOSCOPY (N/A)  Subjective: Patient reports nausea earlier.  She reports feeling better now.  She has no complaints right now.    Objective: I have reviewed patient's vital signs and intake and output.  General: alert Resp: clear to auscultation bilaterally Cardio: regular rate and rhythm GI: app tender, dec BS, incisions c/d/i, ND, soft Extremities: scds are on, no calf tenderness Vaginal Bleeding: minimal  Assessment/Plan: s/p Procedure(s) (LRB) with comments: HYSTERECTOMY TOTAL LAPAROSCOPIC (N/A) CYSTOSCOPY (N/A): Stable with decreased uop.  bolus now. cbc and bmet now.  RN instructed to call me with response to bolus. Cont routine post op care scds for dvt prophylaxis IS encouraged CBC in am     LOS: 0 days    Yvonne [name] Y 06/15/2012, 7:05 PM

## 2012-06-15 NOTE — Anesthesia Preprocedure Evaluation (Signed)
Anesthesia Evaluation  Patient identified by MRN, date of birth, ID band Patient awake    Reviewed: Allergy & Precautions, H&P , Patient's Chart, lab work & pertinent test results, reviewed documented beta blocker date and time   Airway Mallampati: II TM Distance: >3 FB Neck ROM: full    Dental No notable dental hx.    Pulmonary  breath sounds clear to auscultation  Pulmonary exam normal       Cardiovascular Rhythm:regular Rate:Normal     Neuro/Psych    GI/Hepatic   Endo/Other    Renal/GU      Musculoskeletal   Abdominal   Peds  Hematology   Anesthesia Other Findings   Reproductive/Obstetrics                           Anesthesia Physical Anesthesia Plan  ASA: I  Anesthesia Plan: General   Post-op Pain Management:    Induction: Intravenous  Airway Management Planned: Oral ETT  Additional Equipment:   Intra-op Plan:   Post-operative Plan:   Informed Consent: I have reviewed the patients History and Physical, chart, labs and discussed the procedure including the risks, benefits and alternatives for the proposed anesthesia with the patient or authorized representative who has indicated his/her understanding and acceptance.   Dental Advisory Given and Dental advisory given  Plan Discussed with: CRNA and Surgeon  Anesthesia Plan Comments: (  Discussed  general anesthesia, including possible nausea, instrumentation of airway, sore throat,pulmonary aspiration, etc. I asked if the were any outstanding questions, or  concerns before we proceeded. )        Anesthesia Quick Evaluation

## 2012-06-15 NOTE — Progress Notes (Signed)
Dr Malen Gauze at bedside to discuss continued nausea after Zofran 4 mg IV dose in PACU and meds given in OR.  New orders received.  Scopolamine patch placed behind left ear and Reglan IV given per order.

## 2012-06-15 NOTE — Op Note (Signed)
Preop Diagnosis: Menorrhgia   Postop Diagnosis: Menorrhgia   Procedure: HYSTERECTOMY TOTAL LAPAROSCOPIC CYSTOSCOPY LOA   Anesthesia: General   Anesthesiologist: Jiles Garter, MD   Attending: Purcell Nails, MD   Assistant: Osborn Coho MD  Findings: 1.adhesion to anterior abdominal wall  Pathology: uterus and cervix  Fluids: 2300 cc  UOP: 600 cc  EBL: 100 cc  Complications: None  Procedure: The patient was taken to the operating room, placed under general anesthesia and prepped and draped in the normal sterile fashion. A Foley catheter was placed in the bladder. The uterus sounded to 9cm. A weighted speculum and vaginal retractors were placed in the vagina. Tenaculum was placed on the anterior lip of the cervix.  A size 6cm tip was used after there was difficulty placing the size 8 and the rumi was placed, tip balloon and occluder insufflated. Attention was then turned to the abdomen. A 10 mm infraumbilical incision was made with the scalpel after 5 cc of 25% percent Marcaine was used for local anesthesia. The subcutaneous tissue was dissected and the fascia was incised with the knife. A purse string stitch was placed in the fascia and Hassan placed into the intra-abdominal cavity and anchored to the suture. Intraabdominal placement was confirmed with the laparoscope.  Two 5 mm trochars were placed in the right and left lower quadrants under direct visualization with the laparoscope.  Ant abdominal wall adhesion was excised with the monopolar scissors.  The harmonic scalpel was used to cauterize and cut the uterine ovarian ligaments bilaterally.   Both round ligaments were cauterized and cut with the harmonic scalpel as well and the bladder flap created with the harmonic scalpel and removed away from the uterus. Both uterine arteries were cauterized and cut with harmonic scalpel.  The harmonic was used to circumscribe the [facility] ring after entering the posterior cul de sac  first.  The koh ring was circumscribed to free the uterus and cervix. The uterus was then pulled into the vagina. A 10 mm incision was placed in the RLQ two finger breadths rostral and medial to the 5 mm incision.  Both angles were sutured with 1 PDS.  The  remainder of the cuff was sutured with 1 PDS using interrupted stitches until the vaginal cuff was closed.  A total of 5 sutures were used.   Irrigation was performed.  Gas was allowed to leave the abdomen to check for bleeders and all pedicles were seen to be hemostatic the patient was given indigo carmine.  Cystoscopy was performed and both ureters were seen to efflux indigo carmine without difficulty. The bladder had full integrity with no suture or laceration visualized.  The vagina was inspected and the cuff was noted to be intact.  Attention was then turned back to the abdomen after removing top pair of gloves. The abdomen was reinsufflated with CO2 gas.   The abdomen and pelvis was copiously irrigated and  hemostasis was noted.  The 10 mm port on the patients left side was closed with 0 Vicryl using the fascial closure device.  All trochars were removed under direct visualization using the laparoscope.  The umbilical fascia was reapproximated by tying the circumferential suture. The two 10 mm incisions were closed with 3-0 Monocryl via a subcuticular stitch.  All remaining skin incisions were closed with Dermabond and the 10 mm skin incisions were reinforced using Dermabond.  Sponge lap and needle counts were correct.  The patient tolerated the procedure well and was returned  to the PACU in stable condition

## 2012-06-15 NOTE — Anesthesia Postprocedure Evaluation (Signed)
  Anesthesia Post-op Note  Patient: Yvonne [name]  Procedure(s) Performed: Procedure(s) (LRB) with comments: HYSTERECTOMY TOTAL LAPAROSCOPIC (N/A) CYSTOSCOPY (N/A)  Patient Location: [facility]  Anesthesia Type: General  Level of Consciousness: awake, alert  and oriented  Airway and Oxygen Therapy: Patient Spontanous Breathing  Post-op Pain: mild  Post-op Assessment: Post-op Vital signs reviewed and Patient's Cardiovascular Status Stable  Post-op Vital Signs: Reviewed and stable  Complications: No apparent anesthesia complications

## 2012-06-15 NOTE — Anesthesia Postprocedure Evaluation (Signed)
  Anesthesia Post-op Note  Patient: Yvonne [name]  Procedure(s) Performed: Procedure(s) (LRB) with comments: HYSTERECTOMY TOTAL LAPAROSCOPIC (N/A) CYSTOSCOPY (N/A)  Patient Location: PACU  Anesthesia Type: General  Level of Consciousness: awake, alert  and oriented  Airway and Oxygen Therapy: Patient Spontanous Breathing  Post-op Pain: mild  Post-op Assessment: Post-op Vital signs reviewed  Post-op Vital Signs: Reviewed and stable  Complications: No apparent anesthesia complications

## 2012-06-15 NOTE — Interval H&P Note (Signed)
History and Physical Interval Note:  06/15/2012 9:35 AM  Yvonne [name]  has presented today for surgery, with the diagnosis of Menorrhgia  The various methods of treatment have been discussed with the patient and family. After consideration of risks, benefits and other options for treatment, the patient has consented to  Procedure(s) (LRB) with comments: HYSTERECTOMY TOTAL LAPAROSCOPIC (N/A) CYSTOSCOPY (N/A) as a surgical intervention .  The patient's history has been reviewed, patient examined, no change in status, stable for surgery.  I have reviewed the patient's chart and labs.  Questions were answered to the patient's satisfaction.     Purcell Nails

## 2012-06-15 NOTE — H&P (View-Only) (Signed)
Yvonne [name] is a 41 y.o. female G2P2 who presents for a total laparoscopic hysterectomy because of menometrorrhagia and submucosal fibroids. Patient has a long history of symptomatic uterine fibroids and underwent an abdominal myomectomy in 2005. Over the past year patient reports vaginal bleeding twice a month that only allow about one week without a flow. During these episodes she may change a pad once or every 3 hours (using a super plus tampon). She admits to cramping rated at a 7/10 but finds relief (3/10) with Midol or Aleve. She admits to occasional positional dyspareunia but denies vaginitis symptoms, changes in urinary or bowel habits or dizziness. A CBC and TSH done July /2013 were both normal. An endometrial biopsy in April 2013 returned benign findings with no hyperplasia, atypia or malignancy. A year ago patient had an ultrasound and sono-hysterogram that showed: the uterus measuring 8.47 X space 4.93X space 4.35 space cm with 2 submucosal fibroids measuring 1.05 cm and 0.91 cm respectively. Both of the patient's ovaries appeared normal on this study and at that time an IUD was visible within the endometrial cavity.  A review of both medical and surgical management options were given to the patient regarding her symptoms. Given that she has completed her desire to bear children and the disruptive nature of her symptoms she has consented to proceed with hysterectomy.    Past Medical History   OB History: G2P2; C-section 2006 & 2009   GYN History: menarche 41 YO LMP 03/02/12 Contracepton-Tubal Sterilization; Has a history of HSV-2 and HPV; Underwent cryotherapy in 2010 for abnormal PAP smear but they have been normal since. Last PAP smear 2011   Medical History: migraines, fibroids   Surgical History: 2005 Myomectomy; 2009 Tubal Sterilization  Admits to severe nausea and tremors with anesthesia but no history of blood transfusions   Family History: diabetes mellitus, hypertension    Social History: Divorced, works part-time with Medicaid Billing; Occasional alcohol consumption but denies tobacco or illicit drugs      Outpatient Encounter Prescriptions as of 06/10/2012     Medication  Sig  Dispense  Refill     .  niacin-simvastatin (SIMCOR) 500-20 MG 24 hr tablet  Take 1 tablet by mouth at bedtime.       Allergies     Allergen  Reactions     .  Sulfa Antibiotics  Itching     Denies sensitivity to latex, peanuts, shellfish, soy or adhesives.   ROS: + glasses for reading, current disruptive cough (following a recent sinus infection) that is "wet" but not productive, + thick post nasal drainage but denies headache, vision changes, dysphagia, tinnitus, dizziness, chest pain, shortness of breath, nausea, vomiting, diarrhea, dysuria, hematuria, pelvic pain, swelling of joints,easy bruising, myalgias, arthralgias, skin rashes and except as is mentioned in the history of present illness, patient's review of systems is otherwise negative   Physical Exam  BP 112/70  Pulse 64  Temp 98.9 F (37.2 C)  Resp 14  Ht 5' 5" (1.651 m)  Wt 154 lb (69.854 kg)  BMI 25.63 kg/m2  LMP 03/02/2012  Neck: supple without masses or thyromegaly  Lungs: clear to auscultation but coarse breath sounds  Heart: regular rate and rhythm  Abdomen: soft, non-tender and no organomegaly  Pelvic:EGBUS- wnl; vagina-normal rugae; uterus-normal size, cervix without lesions or motion tenderness; adnexae-no tenderness or masses  Extremities: no clubbing, cyanosis or edema    Assesment: Menometrorrhagia  Submucosal Fibroid   Disposition: A discussion was held with patient regarding   the indication for her procedure(s) along with the risks, which include but are not limited to: reaction to anesthesia, damage to adjacent organs, infection, excessive bleeding, early menopause, pelvic prolapse and possible need for an open abdominal incision. A Miralax bowel prep was given to patient to complete 24 hours prior  to surgery. Patient verbalized understanding of her risks, along with the procedure and preoperative instructions given. She has consented to proceed to a Total Laparoscopic Hysterectomy with Cystoscopy at [facility] June 15, 2012 at 9:15 a.m.   CSN# [id number]  Alyssamae Klinck J. Arley Salamone, PA-C for Dr. Angela Y Roberts, M.D.         

## 2012-06-16 ENCOUNTER — Encounter (HOSPITAL_COMMUNITY): Payer: Self-pay | Admitting: Obstetrics and Gynecology

## 2012-06-16 LAB — CBC
MCH: 30.9 pg (ref 26.0–34.0)
Platelets: 223 10*3/uL (ref 150–400)
RBC: 3.88 MIL/uL (ref 3.87–5.11)
WBC: 9 10*3/uL (ref 4.0–10.5)

## 2012-06-16 MED ORDER — ONDANSETRON HCL 4 MG PO TABS: 4.0000 mg | ORAL_TABLET | Freq: Three times a day (TID) | ORAL | Status: DC | PRN

## 2012-06-16 MED ORDER — IBUPROFEN 600 MG PO TABS: 600.0000 mg | ORAL_TABLET | Freq: Four times a day (QID) | ORAL | Status: DC | PRN

## 2012-06-16 MED ORDER — OXYCODONE-ACETAMINOPHEN 5-325 MG PO TABS: 1.0000 | ORAL_TABLET | ORAL | Status: DC | PRN

## 2012-06-16 NOTE — Progress Notes (Signed)
Patient discharged to home with family friend. To car via wheelchair by C. Victory Dakin, [place]. No equipment for home ordered at discharged. Condition stable.

## 2012-06-16 NOTE — Progress Notes (Signed)
Yvonne [name] is a42 y.o.  [id number]  Post Op Date # 1-Total Laparoscopic Hysterectomy with Cystoscopy  Subjective: Patient is doing well with the exception of significant right shoulder gas pain. Patient has having no pain from incisions and minimal pelvic discomfort.,  She is tolerating liquids and ambulating, but has not passed flatus and still has Foley.  Objective: Vital signs in last 24 hours: Temp:  [97.7 F (36.5 C)-98.9 F (37.2 C)] 98.9 F (37.2 C) (10/22 0541) Pulse Rate:  [69-87] 82  (10/22 0541) Resp:  [10-20] 18  (10/22 0535) BP: (90-131)/(41-76) 128/73 mmHg (10/22 0541) SpO2:  [97 %-100 %] 100 % (10/22 0541) Weight:  [72.576 kg (160 lb)] 72.576 kg (160 lb) (10/21 1630)  Intake/Output from previous day: 10/21 0701 - 10/22 0700 In: 6281.6 [P.O.:1360; I.V.:4921.6] Out: 5775 [Urine:5675] Intake/Output this shift:    Lab 06/16/12 0530 06/15/12 1939  WBC 9.0 9.2  HGB 12.0 12.6  HCT 34.3* 36.4  PLT 223 210     Lab 06/15/12 1939  NA 136  K 3.9  CL 104  CO2 22  BUN 5*  CREATININE 0.56  CALCIUM 8.6  PROT --  BILITOT --  ALKPHOS --  ALT --  AST --  GLUCOSE 140*    EXAM: General: alert, cooperative, mild distress and due to shoulder pain. Resp: clear to auscultation bilaterally Cardio: regular rate and rhythm GI: soft, appropriately tender; incisions intact without evidence of infection. Extremities: SCD hose in place and functioning; negative Homan's sign Vaginal Bleeding: scant, dry amount on perineal pad   Assessment: s/p Procedure(s): HYSTERECTOMY TOTAL LAPAROSCOPIC CYSTOSCOPY: stable  Plan: Advance diet Encourage ambulation Advance to PO medication Discharge home Discontinue Foley  LOS: 1 day    Yvonne Anfinson, PA-C 06/16/2012 7:27 AM

## 2012-06-16 NOTE — H&P (Signed)
Yvonne [name] is a 41 y.o. female G2P2 who presents for a total laparoscopic hysterectomy because of menometrorrhagia and submucosal fibroids. Patient has a long history of symptomatic uterine fibroids and underwent an abdominal myomectomy in 2005. Over the past year patient reports vaginal bleeding twice a month that only allow about one week without a flow. During these episodes she may change a pad once or every 3 hours (using a super plus tampon). She admits to cramping rated at a 7/10 but finds relief (3/10) with Midol or Aleve. She admits to occasional positional dyspareunia but denies vaginitis symptoms, changes in urinary or bowel habits or dizziness. A CBC and TSH done July /2013 were both normal. An endometrial biopsy in April 2013 returned benign findings with no hyperplasia, atypia or malignancy. A year ago patient had an ultrasound and sono-hysterogram that showed: the uterus measuring 8.47 X space 4.93X space 4.35 space cm with 2 submucosal fibroids measuring 1.05 cm and 0.91 cm respectively. Both of the patient's ovaries appeared normal on this study and at that time an IUD was visible within the endometrial cavity.  A review of both medical and surgical management options were given to the patient regarding her symptoms. Given that she has completed her desire to bear children and the disruptive nature of her symptoms she has consented to proceed with hysterectomy.    Past Medical History   OB History: G2P2; C-section 2006 & 2009   GYN History: menarche 41 YO LMP 03/02/12 Contracepton-Tubal Sterilization; Has a history of HSV-2 and HPV; Underwent cryotherapy in 2010 for abnormal PAP smear but they have been normal since. Last PAP smear 2011   Medical History: migraines, fibroids   Surgical History: 2005 Myomectomy; 2009 Tubal Sterilization  Admits to severe nausea and tremors with anesthesia but no history of blood transfusions   Family History: diabetes mellitus, hypertension    Social History: Divorced, works part-time with [employer]; Occasional alcohol consumption but denies tobacco or illicit drugs      Outpatient Encounter Prescriptions as of 06/10/2012     Medication  Sig  Dispense  Refill     .  niacin-simvastatin (SIMCOR) 500-20 MG 24 hr tablet  Take 1 tablet by mouth at bedtime.       Allergies     Allergen  Reactions     .  Sulfa Antibiotics  Itching     Denies sensitivity to latex, peanuts, shellfish, soy or adhesives.   ROS: + glasses for reading, current disruptive cough (following a recent sinus infection) that is "wet" but not productive, + thick post nasal drainage but denies headache, vision changes, dysphagia, tinnitus, dizziness, chest pain, shortness of breath, nausea, vomiting, diarrhea, dysuria, hematuria, pelvic pain, swelling of joints,easy bruising, myalgias, arthralgias, skin rashes and except as is mentioned in the history of present illness, patient's review of systems is otherwise negative   Physical Exam  BP 112/70  Pulse 64  Temp 98.9 F (37.2 C)  Resp 14  Ht 5\' 5"  (1.651 m)  Wt 154 lb (69.854 kg)  BMI 25.63 kg/m2  LMP 03/02/2012  Neck: supple without masses or thyromegaly  Lungs: clear to auscultation but coarse breath sounds  Heart: regular rate and rhythm  Abdomen: soft, non-tender and no organomegaly  Pelvic:EGBUS- wnl; vagina-normal rugae; uterus-normal size, cervix without lesions or motion tenderness; adnexae-no tenderness or masses  Extremities: no clubbing, cyanosis or edema    Assesment: Menometrorrhagia  Submucosal Fibroid   Disposition: A discussion was held with patient regarding  the indication for her procedure(s) along with the risks, which include but are not limited to: reaction to anesthesia, damage to adjacent organs, infection, excessive bleeding, early menopause, pelvic prolapse and possible need for an open abdominal incision. A Miralax bowel prep was given to patient to complete 24 hours prior  to surgery. Patient verbalized understanding of her risks, along with the procedure and preoperative instructions given. She has consented to proceed to a Total Laparoscopic Hysterectomy with Cystoscopy at [facility] June 15, 2012 at 9:15 a.m.   CSN# [id number]  Solangel Mcmanaway J. Lowell Guitar, PA-C for Dr. Purcell Nails, M.D.

## 2012-06-16 NOTE — Discharge Summary (Signed)
  Physician Discharge Summary  Patient ID: Yvonne [name] MRN: [id number] DOB/AGE: [birth year] 41 y.o.  Admit date: 06/15/2012 Discharge date: 06/16/2012   Discharge Diagnoses:  Menometrorrhagia and Submucosal Uterine Fibroids Active Problems:  * No active hospital problems. *    Operation:  Total Laparoscopic Hysterectomy with Cystoscopy   Discharged Condition: Good  Hospital Course:  On the date of admission, the patient underwent the  aforementioned procedures and tolerated them well. Post operative course was unremarkable with patient resuming bowel and bladder function by post operative day #1 and therefore was deemed ready for discharge home.  Disposition: Final discharge disposition not confirmed  Discharge Medications:   Yvonne, [name]  Home Medication Instructions [id number]   Printed on:06/16/12 0748  Medication Information                    niacin-simvastatin (SIMCOR) 500-20 MG 24 hr tablet Take 1 tablet by mouth at bedtime.           ibuprofen (ADVIL,MOTRIN) 600 MG tablet Take 1 tablet (600 mg total) by mouth every 6 (six) hours as needed (mild pain).           oxyCODONE-acetaminophen (PERCOCET/ROXICET) 5-325 MG per tablet Take 1-2 tablets by mouth every 4 (four) hours as needed (moderate to severe pain (when tolerating fluids)).           ondansetron (ZOFRAN) 4 MG tablet Take 1 tablet (4 mg total) by mouth every 8 (eight) hours as needed for nausea.               Follow-up: Dr. Su Hilt, July 29, 2012 at 8:45 a.m.   SignedHenreitta Leber, PA-C 06/16/2012, 7:48 AM

## 2012-06-17 ENCOUNTER — Encounter (HOSPITAL_COMMUNITY): Payer: Self-pay | Admitting: *Deleted

## 2012-06-17 ENCOUNTER — Inpatient Hospital Stay (HOSPITAL_COMMUNITY): Payer: 59

## 2012-06-17 ENCOUNTER — Inpatient Hospital Stay (HOSPITAL_COMMUNITY)
Admission: AD | Admit: 2012-06-17 | Discharge: 2012-06-19 | DRG: 394 | Disposition: A | Discharge status: Home / Self Care | Payer: 59 | Source: Ambulatory Visit | Attending: Obstetrics and Gynecology | Admitting: Obstetrics and Gynecology

## 2012-06-17 DIAGNOSIS — Z9889 Other specified postprocedural states: Secondary | ICD-10-CM

## 2012-06-17 DIAGNOSIS — Y838 Other surgical procedures as the cause of abnormal reaction of the patient, or of later complication, without mention of misadventure at the time of the procedure: Secondary | ICD-10-CM | POA: Diagnosis present

## 2012-06-17 DIAGNOSIS — K929 Disease of digestive system, unspecified: Principal | ICD-10-CM | POA: Diagnosis present

## 2012-06-17 DIAGNOSIS — Z09 Encounter for follow-up examination after completed treatment for conditions other than malignant neoplasm: Secondary | ICD-10-CM

## 2012-06-17 DIAGNOSIS — K560 Paralytic ileus: Secondary | ICD-10-CM | POA: Diagnosis present

## 2012-06-17 LAB — CBC WITH DIFFERENTIAL/PLATELET
Basophils Absolute: 0 10*3/uL (ref 0.0–0.1)
Basophils Relative: 0 % (ref 0–1)
Eosinophils Absolute: 0 10*3/uL (ref 0.0–0.7)
Lymphs Abs: 0.9 10*3/uL (ref 0.7–4.0)
MCH: 30.9 pg (ref 26.0–34.0)
MCHC: 34.1 g/dL (ref 30.0–36.0)
Neutrophils Relative %: 79 % — ABNORMAL HIGH (ref 43–77)
Platelets: 192 10*3/uL (ref 150–400)
RBC: 3.88 MIL/uL (ref 3.87–5.11)

## 2012-06-17 LAB — COMPREHENSIVE METABOLIC PANEL
ALT: 8 U/L (ref 0–35)
AST: 12 U/L (ref 0–37)
Albumin: 3.4 g/dL — ABNORMAL LOW (ref 3.5–5.2)
Alkaline Phosphatase: 38 U/L — ABNORMAL LOW (ref 39–117)
Potassium: 3.4 mEq/L — ABNORMAL LOW (ref 3.5–5.1)
Sodium: 138 mEq/L (ref 135–145)
Total Protein: 6.1 g/dL (ref 6.0–8.3)

## 2012-06-17 LAB — URINALYSIS, ROUTINE W REFLEX MICROSCOPIC
Glucose, UA: NEGATIVE mg/dL
Leukocytes, UA: NEGATIVE
Protein, ur: NEGATIVE mg/dL

## 2012-06-17 LAB — AMYLASE: Amylase: 37 U/L (ref 0–105)

## 2012-06-17 MED ORDER — PROMETHAZINE HCL 25 MG/ML IJ SOLN
12.5000 mg | INTRAMUSCULAR | Status: DC | PRN
  Administered 2012-06-17: 12.5 mg via INTRAVENOUS
  Filled 2012-06-17: qty 1

## 2012-06-17 MED ORDER — KETOROLAC TROMETHAMINE 30 MG/ML IJ SOLN
30.0000 mg | Freq: Once | INTRAMUSCULAR | Status: AC
  Administered 2012-06-17: 30 mg via INTRAVENOUS
  Filled 2012-06-17: qty 1

## 2012-06-17 MED ORDER — DEXTROSE IN LACTATED RINGERS 5 % IV SOLN
INTRAVENOUS | Status: DC
  Administered 2012-06-17: 20:00:00 via INTRAVENOUS

## 2012-06-17 MED ORDER — HYDROMORPHONE HCL PF 1 MG/ML IJ SOLN
1.0000 mg | Freq: Once | INTRAMUSCULAR | Status: AC
  Administered 2012-06-17: 1 mg via INTRAVENOUS
  Filled 2012-06-17: qty 1

## 2012-06-17 MED ORDER — IOHEXOL 300 MG/ML  SOLN
100.0000 mL | Freq: Once | INTRAMUSCULAR | Status: AC | PRN
  Administered 2012-06-17: 100 mL via INTRAVENOUS

## 2012-06-17 MED ORDER — ONDANSETRON HCL 4 MG/2ML IJ SOLN
4.0000 mg | Freq: Four times a day (QID) | INTRAMUSCULAR | Status: DC | PRN
  Administered 2012-06-17: 4 mg via INTRAVENOUS
  Filled 2012-06-17: qty 2

## 2012-06-17 NOTE — Progress Notes (Signed)
History  Yvonne [name] is a 41 y.o. G2P2 at Unknown   Chief Complaint  Patient presents with  . Nausea  . Emesis  started vomiting at 0700 can not eat or keep down medicine, not passing gas yet   Unknown   Medication List     As of 06/17/2012  7:46 PM    ASK your doctor about these medications         ibuprofen 600 MG tablet   Commonly known as: ADVIL,MOTRIN   Take 1 tablet (600 mg total) by mouth every 6 (six) hours as needed (mild pain).      niacin-simvastatin 500-20 MG 24 hr tablet   Commonly known as: SIMCOR   Take 1 tablet by mouth at bedtime.      ondansetron 4 MG tablet   Commonly known as: ZOFRAN   Take 1 tablet (4 mg total) by mouth every 8 (eight) hours as needed for nausea.      oxyCODONE-acetaminophen 5-325 MG per tablet   Commonly known as: PERCOCET/ROXICET   Take 1-2 tablets by mouth every 4 (four) hours as needed (moderate to severe pain (when tolerating fluids)).          Chief Complaint  Patient presents with  . Nausea  . Emesis   @SFHPI @  Prior to Admission medications   Medication Sig Start Date End Date Taking? Authorizing Provider  niacin-simvastatin [facility]) 500-20 MG 24 hr tablet Take 1 tablet by mouth at bedtime.   Yes Historical Provider, MD  ondansetron (ZOFRAN) 4 MG tablet Take 1 tablet (4 mg total) by mouth every 8 (eight) hours as needed for nausea. 06/16/12  Yes Henreitta Leber, PA  oxyCODONE-acetaminophen (PERCOCET/ROXICET) 5-325 MG per tablet Take 1-2 tablets by mouth every 4 (four) hours as needed (moderate to severe pain (when tolerating fluids)). 06/16/12  Yes Henreitta Leber, PA  ibuprofen (ADVIL,MOTRIN) 600 MG tablet Take 1 tablet (600 mg total) by mouth every 6 (six) hours as needed (mild pain). 06/16/12   Henreitta Leber, PA    Patient Active Problem List  Diagnosis  . Menometrorrhagia  . Fibroids, submucosal (x 2 small - ~1cm - on sonohyst 02/04/11)   Vitals:  Blood pressure 155/84, pulse 71, temperature 99.5 F (37.5 C),  temperature source Oral, resp. rate 20, last menstrual period 05/25/2012. OB History    Grav Para Term Preterm Abortions TAB SAB Ect Mult Living   2 2        2       Past Medical History  Diagnosis Date  . Yeast infection 06/19/2010  . Vulvitis 02/04/11  . Hx: UTI (urinary tract infection) 05/06/11  . H/O menorrhagia 01/16/11  . Fibroid 2005  . Herpes 2009  . HPV in female   . No pertinent past medical history   . PONV (postoperative nausea and vomiting)     Past Surgical History  Procedure Date  . Cryotherapy 2009  . Myomectomy 2005  . Cesarean section 2008 & 2009  . Tubal ligation     bilateral  . Laparoscopic hysterectomy 06/15/2012    Procedure: HYSTERECTOMY TOTAL LAPAROSCOPIC;  Surgeon: Purcell Nails, MD;  Location: [facility];  Service: Gynecology;  Laterality: N/A;  . Cystoscopy 06/15/2012    Procedure: CYSTOSCOPY;  Surgeon: Purcell Nails, MD;  Location: [facility];  Service: Gynecology;  Laterality: N/A;    Family History  Problem Relation Age of Onset  . Hypertension Mother   . Heart disease Mother   . Diabetes Father   .  Alcohol abuse Father   . Diabetes Brother   . Hypertension Brother   . Alcohol abuse Brother   . Heart attack Maternal Grandfather   . Hypertension Brother     History  Substance Use Topics  . Smoking status: Never Smoker   . Smokeless tobacco: Never Used  . Alcohol Use: Yes     daily    Allergies:  Allergies  Allergen Reactions  . Sulfa Antibiotics Itching    Prescriptions prior to admission  Medication Sig Dispense Refill  . niacin-simvastatin (SIMCOR) 500-20 MG 24 hr tablet Take 1 tablet by mouth at bedtime.      . ondansetron (ZOFRAN) 4 MG tablet Take 1 tablet (4 mg total) by mouth every 8 (eight) hours as needed for nausea.  20 tablet  0  . oxyCODONE-acetaminophen (PERCOCET/ROXICET) 5-325 MG per tablet Take 1-2 tablets by mouth every 4 (four) hours as needed (moderate to severe pain (when tolerating fluids)).  30 tablet  0  .  ibuprofen (ADVIL,MOTRIN) 600 MG tablet Take 1 tablet (600 mg total) by mouth every 6 (six) hours as needed (mild pain).  30 tablet  1    @ROS @ Physical Exam   Blood pressure 155/84, pulse 71, temperature 99.5 F (37.5 C), temperature source Oral, resp. rate 20, last menstrual period 05/25/2012.  @PHYSEXAMBYAGE2 @ Labs:  No results found for this or any previous visit (from the past 24 hour(s)). ASSESSMENT: Patient Active Problem List  Diagnosis  . Menometrorrhagia  . Fibroids, submucosal (x 2 small - ~1cm - on sonohyst 02/04/11)   Physical Examination: General appearance - alert, well appearing, and in no distress and grimace with repositioning Physical exam:  wnl lungs clear bilaterally, AP RRR, abd soft, gravid, nt, bowel sounds hypoactive, abdomen soft tender No edema to lower extremities ED Course  Assessment/Plan PO vag hyst day 1 N,V IV hydration, zofran, dilaudid, toradahl, ua Lavera Guise, CNM

## 2012-06-17 NOTE — Progress Notes (Signed)
Call from Dr. Normand Sloop now for CT of abd and pelvis with Contrast, Md had been updated at 1945 note on pt status, of not abd incisions well approximated no redness, edema, or drainage BP 155/84  Pulse 71  Temp 99.5 F (37.5 C) (Oral)  Resp 20  LMP 05/25/2012 Lavera Guise, CNM

## 2012-06-17 NOTE — MAU Note (Signed)
Had a hysterectomy Monday and was discharge yesterday. Unable to keep down pains meds due to lots of vomiting. Gas pains unbearable.

## 2012-06-18 LAB — CBC
MCV: 89.7 fL (ref 78.0–100.0)
Platelets: 175 10*3/uL (ref 150–400)
RBC: 3.69 MIL/uL — ABNORMAL LOW (ref 3.87–5.11)
RDW: 11.8 % (ref 11.5–15.5)
WBC: 5.2 10*3/uL (ref 4.0–10.5)

## 2012-06-18 LAB — BASIC METABOLIC PANEL
CO2: 28 mEq/L (ref 19–32)
Calcium: 8.7 mg/dL (ref 8.4–10.5)
Creatinine, Ser: 0.64 mg/dL (ref 0.50–1.10)
GFR calc Af Amer: >90 mL/min (ref >90)
GFR calc non Af Amer: >90 mL/min (ref >90)
Sodium: 141 mEq/L (ref 135–145)

## 2012-06-18 MED ORDER — DEXTROSE IN LACTATED RINGERS 5 % IV SOLN
INTRAVENOUS | Status: DC
  Administered 2012-06-18 – 2012-06-19 (×4): via INTRAVENOUS

## 2012-06-18 MED ORDER — KETOROLAC TROMETHAMINE 30 MG/ML IJ SOLN: 30.0000 mg | Freq: Four times a day (QID) | INTRAMUSCULAR | Status: DC

## 2012-06-18 MED ORDER — KETOROLAC TROMETHAMINE 30 MG/ML IJ SOLN
30.0000 mg | Freq: Four times a day (QID) | INTRAMUSCULAR | Status: DC
  Administered 2012-06-18 – 2012-06-19 (×6): 30 mg via INTRAVENOUS
  Filled 2012-06-18 (×6): qty 1

## 2012-06-18 MED ORDER — ONDANSETRON HCL 4 MG/2ML IJ SOLN: 4.0000 mg | Freq: Four times a day (QID) | INTRAMUSCULAR | Status: DC | PRN

## 2012-06-18 MED ORDER — PRENATAL MULTIVITAMIN CH
1.0000 | ORAL_TABLET | Freq: Every day | ORAL | Status: DC
  Filled 2012-06-18: qty 1

## 2012-06-18 MED ORDER — HYDROMORPHONE HCL PF 1 MG/ML IJ SOLN
0.2000 mg | INTRAMUSCULAR | Status: DC | PRN
  Administered 2012-06-18 (×4): 0.6 mg via INTRAVENOUS
  Administered 2012-06-19: 0.2 mg via INTRAVENOUS
  Filled 2012-06-18 (×5): qty 1

## 2012-06-18 MED ORDER — ONDANSETRON HCL 4 MG PO TABS: 4.0000 mg | ORAL_TABLET | Freq: Four times a day (QID) | ORAL | Status: DC | PRN

## 2012-06-18 MED ORDER — METOCLOPRAMIDE HCL 5 MG/ML IJ SOLN
10.0000 mg | Freq: Four times a day (QID) | INTRAMUSCULAR | Status: DC
  Administered 2012-06-18 – 2012-06-19 (×6): 10 mg via INTRAVENOUS
  Filled 2012-06-18 (×6): qty 2

## 2012-06-18 MED ORDER — SIMETHICONE 80 MG PO CHEW
80.0000 mg | CHEWABLE_TABLET | Freq: Four times a day (QID) | ORAL | Status: DC | PRN
  Filled 2012-06-18: qty 1

## 2012-06-18 NOTE — Progress Notes (Addendum)
Subjective: Patient reports + flatus and no problems voiding.  She had the first episode of flatus last night, small amount.  She says she is hungry and feels like she has her appetite back.  No BM yet.    Objective: I have reviewed patient's vital signs.  General: alert and no distress Resp: clear to auscultation bilaterally Cardio: regular rate and rhythm GI: soft, app tender, ?softly distended, normal BS, incisions c/d and dressed with dermabond Extremities: no calf tenderness Vaginal Bleeding: none  CT scan was neg except post op air in abdomen  Assessment/Plan: POD#3 s/p TLH and cystoscopy readmitted with ileus.  Cont reglan.  Trial of clears.  Encouraged ambulation.  Cont to obs today and if tol clears will advance to regular in am.  Ct scan findings c/w recent laparoscopic surgery.    LOS: 1 day    Yvonne [name] Y 06/18/2012, 12:47 PM

## 2012-06-18 NOTE — Progress Notes (Signed)
UR Chart review completed.  

## 2012-06-18 NOTE — Progress Notes (Signed)
Subjective: Patient reports nausea, vomiting and incisional pain.  It started this morning.  She has not passed gas and is unable to keep anything down  Objective: BP 155/84  Pulse 71  Temp 99.5 F (37.5 C) (Oral)  Resp 20  LMP 05/25/2012  General: alert and cooperative Resp: clear to auscultation bilaterally Cardio: regular rate and rhythm GI: abnormal findings:  distended and hypoactive bowel sounds and incision: clean, dry and intact Extremities: extremities normal, atraumatic, no cyanosis or edema Vaginal Bleeding: minimal The abdomen is soft and no rebound tenderness or guarding present Results for orders placed during the hospital encounter of 06/17/12  URINALYSIS, ROUTINE W REFLEX MICROSCOPIC      Component Value Range   Color, Urine YELLOW  YELLOW   APPearance CLEAR  CLEAR   Specific Gravity, Urine 1.015  1.005 - 1.030   pH 7.5  5.0 - 8.0   Glucose, UA NEGATIVE  NEGATIVE mg/dL   Hgb urine dipstick SMALL (*) NEGATIVE   Bilirubin Urine NEGATIVE  NEGATIVE   Ketones, ur 40 (*) NEGATIVE mg/dL   Protein, ur NEGATIVE  NEGATIVE mg/dL   Urobilinogen, UA 0.2  0.0 - 1.0 mg/dL   Nitrite NEGATIVE  NEGATIVE   Leukocytes, UA NEGATIVE  NEGATIVE  AMYLASE      Component Value Range   Amylase 37  0 - 105 U/L  LIPASE, BLOOD      Component Value Range   Lipase 27  11 - 59 U/L  CBC WITH DIFFERENTIAL      Component Value Range   WBC 6.2  4.0 - 10.5 K/uL   RBC 3.88  3.87 - 5.11 MIL/uL   Hemoglobin 12.0  12.0 - 15.0 g/dL   HCT 16.1 (*) 09.6 - 04.5 %   MCV 90.7  78.0 - 100.0 fL   MCH 30.9  26.0 - 34.0 pg   MCHC 34.1  30.0 - 36.0 g/dL   RDW 40.9  81.1 - 91.4 %   Platelets 192  150 - 400 K/uL   Neutrophils Relative 79 (*) 43 - 77 %   Neutro Abs 4.9  1.7 - 7.7 K/uL   Lymphocytes Relative 14  12 - 46 %   Lymphs Abs 0.9  0.7 - 4.0 K/uL   Monocytes Relative 6  3 - 12 %   Monocytes Absolute 0.4  0.1 - 1.0 K/uL   Eosinophils Relative 0  0 - 5 %   Eosinophils Absolute 0.0  0.0 -  0.7 K/uL   Basophils Relative 0  0 - 1 %   Basophils Absolute 0.0  0.0 - 0.1 K/uL  COMPREHENSIVE METABOLIC PANEL      Component Value Range   Sodium 138  135 - 145 mEq/L   Potassium 3.4 (*) 3.5 - 5.1 mEq/L   Chloride 101  96 - 112 mEq/L   CO2 29  19 - 32 mEq/L   Glucose, Bld 216 (*) 70 - 99 mg/dL   BUN 4 (*) 6 - 23 mg/dL   Creatinine, Ser 7.82  0.50 - 1.10 mg/dL   Calcium 8.8  8.4 - 95.6 mg/dL   Total Protein 6.1  6.0 - 8.3 g/dL   Albumin 3.4 (*) 3.5 - 5.2 g/dL   AST 12  0 - 37 U/L   ALT 8  0 - 35 U/L   Alkaline Phosphatase 38 (*) 39 - 117 U/L   Total Bilirubin 0.3  0.3 - 1.2 mg/dL   GFR calc non Af Amer >90  >  90 mL/min   GFR calc Af Amer >90  >90 mL/min  URINE MICROSCOPIC-ADD ON      Component Value Range   Squamous Epithelial / LPF FEW (*) RARE   RBC / HPF    <3 RBC/hpf   Value: NO FORMED ELEMENTS SEEN ON URINE MICROSCOPIC EXAMINATION   Urine-Other MUCOUS PRESENT     Assessment: s/p : stable and ileus present No sign of obstruction of the bowel on CT scan  Plan: NPO give reglan for bowel stimulation.  encourage ambulation.    LOS: 1 day    Evalynn Hankins A 06/18/2012, 12:03 AM

## 2012-06-19 ENCOUNTER — Other Ambulatory Visit: Payer: Self-pay

## 2012-06-19 ENCOUNTER — Inpatient Hospital Stay (HOSPITAL_COMMUNITY)
Admission: AD | Admit: 2012-06-19 | Discharge: 2012-06-19 | Disposition: A | Discharge status: Home / Self Care | Payer: 59 | Source: Ambulatory Visit | Attending: Obstetrics and Gynecology | Admitting: Obstetrics and Gynecology

## 2012-06-19 DIAGNOSIS — E860 Dehydration: Secondary | ICD-10-CM | POA: Insufficient documentation

## 2012-06-19 DIAGNOSIS — R112 Nausea with vomiting, unspecified: Secondary | ICD-10-CM | POA: Insufficient documentation

## 2012-06-19 LAB — CBC WITH DIFFERENTIAL/PLATELET
Basophils Relative: 0 % (ref 0–1)
Eosinophils Absolute: 0.1 10*3/uL (ref 0.0–0.7)
Eosinophils Relative: 1 % (ref 0–5)
MCH: 31.4 pg (ref 26.0–34.0)
MCHC: 35.2 g/dL (ref 30.0–36.0)
MCV: 89.3 fL (ref 78.0–100.0)
Neutrophils Relative %: 88 % — ABNORMAL HIGH (ref 43–77)
Platelets: 218 10*3/uL (ref 150–400)
RDW: 11.4 % — ABNORMAL LOW (ref 11.5–15.5)

## 2012-06-19 LAB — BASIC METABOLIC PANEL
BUN: 4 mg/dL — ABNORMAL LOW (ref 6–23)
Calcium: 9.5 mg/dL (ref 8.4–10.5)
Creatinine, Ser: 0.56 mg/dL (ref 0.50–1.10)
GFR calc non Af Amer: >90 mL/min (ref >90)
Glucose, Bld: 144 mg/dL — ABNORMAL HIGH (ref 70–99)
Sodium: 140 mEq/L (ref 135–145)

## 2012-06-19 LAB — URINALYSIS, ROUTINE W REFLEX MICROSCOPIC
Ketones, ur: NEGATIVE mg/dL
Leukocytes, UA: NEGATIVE
Protein, ur: NEGATIVE mg/dL
Urobilinogen, UA: 1 mg/dL (ref 0.0–1.0)

## 2012-06-19 LAB — URINE MICROSCOPIC-ADD ON

## 2012-06-19 MED ORDER — OXYCODONE-ACETAMINOPHEN 5-325 MG PO TABS: 1.0000 | ORAL_TABLET | ORAL | Status: DC | PRN

## 2012-06-19 MED ORDER — METRONIDAZOLE 500 MG PO TABS: 500.0000 mg | ORAL_TABLET | Freq: Two times a day (BID) | ORAL | Status: DC

## 2012-06-19 MED ORDER — HYDROMORPHONE HCL 2 MG PO TABS: 2.0000 mg | ORAL_TABLET | ORAL | Status: DC | PRN

## 2012-06-19 MED ORDER — AMOXICILLIN-POT CLAVULANATE 875-125 MG PO TABS: 1.0000 | ORAL_TABLET | Freq: Two times a day (BID) | ORAL | Status: DC

## 2012-06-19 NOTE — MAU Note (Signed)
Had laporscopic hysterectomy on Monday. Went home Tues and back in hosp Weds night due to n/v and dehydrated. Just went home today.

## 2012-06-19 NOTE — MAU Note (Signed)
Hillary Steelman CNM called and aware of lab results that are back. Pt is in lobby due to no rooms in MAU. CNM will come see pt

## 2012-06-19 NOTE — Discharge Summary (Signed)
  Physician Discharge Summary  Patient ID: Yvonne [name] MRN: [id number] DOB/AGE: 26-Jun-1971 46 y.o.  Admit date: 06/17/2012 Discharge date: 06/19/2012   Discharge Diagnoses:  Post Operative Ileus,  Nause and Vomiting,  Pelvic Pain and S/P Total Laparoscopic Hysterectomy Active Problems:  * No active hospital problems. *    Operation: S/P Total Laparoscopic Hysterectomy with Cystoscopy (06/15/12)   Discharged Condition: Good  Hospital Course: Patient was re-admitted two days following a Total Laparoscopic Hysterectomy for pelvic pain, nausea and vomiting.  Patient's symptoms quickly abated with IV hydration, antiemetic/prokinetic  and analgesia. By hospital day #2, patient was tolerating a regular diet, had good pain control and had resumed normal bowel and bladder function.  Consequently she was discharged home.  Disposition: 01-Home or Self Care  Discharge Medications:   [name], [name]  Home Medication Instructions [id number]   Printed on:06/19/12 0744  Medication Information                    niacin-simvastatin (SIMCOR) 500-20 MG 24 hr tablet Take 1 tablet by mouth at bedtime.           ibuprofen (ADVIL,MOTRIN) 600 MG tablet Take 1 tablet (600 mg total) by mouth every 6 (six) hours as needed (mild pain).           oxyCODONE-acetaminophen (PERCOCET/ROXICET) 5-325 MG per tablet Take 1-2 tablets by mouth every 4 (four) hours as needed (moderate to severe pain (when tolerating fluids)).           ondansetron (ZOFRAN) 4 MG tablet Take 1 tablet (4 mg total) by mouth every 8 (eight) hours as needed for nausea.               Follow-up: Dr. Su Hilt,  July 27, 2012 at 8:45 a.m.   SignedHenreitta Leber, PA-C 06/19/2012, 7:44 AM

## 2012-06-19 NOTE — Progress Notes (Signed)
Yvonne [name] is a27 y.o.  [id number]  Post Op Date #4/Hospital Date #2  Subjective: Patient is doing much better. Denies any further nausea or pain. Is tolerating Jello and other liquids, ambulating in the halls, voiding, passing flatus and had a bowel movement.  Objective: Vital signs in last 24 hours: Temp:  [97.7 F (36.5 C)-98.8 F (37.1 C)] 98.4 F (36.9 C) (10/25 0527) Pulse Rate:  [65-78] 70  (10/25 0527) Resp:  [18] 18  (10/25 0527) BP: (118-131)/(65-81) 131/81 mmHg (10/25 0527) SpO2:  [98 %-100 %] 98 % (10/25 0527) Weight:  [72.576 kg (160 lb)] 72.576 kg (160 lb) (10/24 1838)  Intake/Output from previous day: 10/24 0701 - 10/25 0700 In: 5141.1 [P.O.:1420; I.V.:3721.1] Out: 5100 [Urine:5100] Intake/Output this shift:    Lab 06/18/12 0512 06/17/12 2134 06/16/12 0530  WBC 5.2 6.2 9.0  HGB 11.4* 12.0 12.0  HCT 33.1* 35.2* 34.3*  PLT 175 192 223     Lab 06/18/12 0512 06/17/12 2134 06/15/12 1939  NA 141 138 136  K 3.6 3.4* 3.9  CL 105 101 104  CO2 28 29 22   BUN 3* 4* 5*  CREATININE 0.64 0.59 0.56  CALCIUM 8.7 8.8 8.6  PROT -- 6.1 --  BILITOT -- 0.3 --  ALKPHOS -- 38* --  ALT -- 8 --  AST -- 12 --  GLUCOSE 105* 216* 140*    EXAM: General: alert, cooperative, no distress and reports feeling 100% better. Resp: clear to auscultation bilaterally Cardio: regular rate and rhythm GI: soft, non-tender; bowel sounds normal; no masses,  no organomegaly Extremities: no calf tenderness Vaginal Bleeding: none Incisions: intact without evidence of infection  Assessment: s/p : Total Laparoscopic Hysterectomy (06/15/12) with post-operative ileus-resolved  Plan: Advance diet Advance to PO medication Discharge home  LOS: 2 days    Brettney Ficken, PA-C 06/19/2012 7:21 AM

## 2012-06-19 NOTE — MAU Note (Signed)
Pt seen by H. Steelman, CNM in triage room as follow up for pt discharged today.  Pt discharged from MAU by CNM after triage and lab work.

## 2012-06-20 LAB — URINE CULTURE: Colony Count: 30000

## 2012-06-22 ENCOUNTER — Encounter: Payer: Self-pay | Admitting: Nurse Practitioner

## 2012-06-22 ENCOUNTER — Ambulatory Visit (INDEPENDENT_AMBULATORY_CARE_PROVIDER_SITE_OTHER): Payer: 59 | Admitting: Nurse Practitioner

## 2012-06-22 ENCOUNTER — Telehealth: Payer: Self-pay | Admitting: Internal Medicine

## 2012-06-22 ENCOUNTER — Telehealth: Payer: Self-pay | Admitting: Obstetrics and Gynecology

## 2012-06-22 VITALS — BP 130/70 | HR 70 | Ht 64.5 in | Wt 147.6 lb

## 2012-06-22 DIAGNOSIS — R197 Diarrhea, unspecified: Secondary | ICD-10-CM

## 2012-06-22 DIAGNOSIS — R140 Abdominal distension (gaseous): Secondary | ICD-10-CM

## 2012-06-22 DIAGNOSIS — R141 Gas pain: Secondary | ICD-10-CM

## 2012-06-22 DIAGNOSIS — R143 Flatulence: Secondary | ICD-10-CM

## 2012-06-22 NOTE — H&P (Signed)
Yvonne [name] is a 41 y.o. female G2P2 who presents for a total laparoscopic hysterectomy because of menometrorrhagia and submucosal fibroids. Patient has a long history of symptomatic uterine fibroids and underwent an abdominal myomectomy in 2005. Over the past year patient reports vaginal bleeding twice a month that only allow about one week without a flow. During these episodes she may change a pad once or every 3 hours (using a super plus tampon). She admits to cramping rated at a 7/10 but finds relief (3/10) with Midol or Aleve. She admits to occasional positional dyspareunia but denies vaginitis symptoms, changes in urinary or bowel habits or dizziness. A CBC and TSH done July /2013 were both normal. An endometrial biopsy in April 2013 returned benign findings with no hyperplasia, atypia or malignancy. A year ago patient had an ultrasound and sono-hysterogram that showed: the uterus measuring 8.47 X space 4.93X space 4.35 space cm with 2 submucosal fibroids measuring 1.05 cm and 0.91 cm respectively. Both of the patient's ovaries appeared normal on this study and at that time an IUD was visible within the endometrial cavity.  A review of both medical and surgical management options were given to the patient regarding her symptoms. Given that she has completed her desire to bear children and the disruptive nature of her symptoms she has consented to proceed with hysterectomy.    Past Medical History   OB History: G2P2; C-section 2006 & 2009   GYN History: menarche 41 YO LMP 03/02/12 Contracepton-Tubal Sterilization; Has a history of HSV-2 and HPV; Underwent cryotherapy in 2010 for abnormal PAP smear but they have been normal since. Last PAP smear 2011   Medical History: migraines, fibroids   Surgical History: 2005 Myomectomy; 2009 Tubal Sterilization  Admits to severe nausea and tremors with anesthesia but no history of blood transfusions   Family History: diabetes mellitus, hypertension    Social History: Divorced, works part-time with Medicaid Billing; Occasional alcohol consumption but denies tobacco or illicit drugs      Outpatient Encounter Prescriptions as of 06/10/2012     Medication  Sig  Dispense  Refill     .  niacin-simvastatin (SIMCOR) 500-20 MG 24 hr tablet  Take 1 tablet by mouth at bedtime.       Allergies     Allergen  Reactions     .  Sulfa Antibiotics  Itching     Denies sensitivity to latex, peanuts, shellfish, soy or adhesives.   ROS: + glasses for reading, current disruptive cough (following a recent sinus infection) that is "wet" but not productive, + thick post nasal drainage but denies headache, vision changes, dysphagia, tinnitus, dizziness, chest pain, shortness of breath, nausea, vomiting, diarrhea, dysuria, hematuria, pelvic pain, swelling of joints,easy bruising, myalgias, arthralgias, skin rashes and except as is mentioned in the history of present illness, patient's review of systems is otherwise negative   Physical Exam  BP 112/70  Pulse 64  Temp 98.9 F (37.2 C)  Resp 14  Ht 5' 5" (1.651 m)  Wt 154 lb (69.854 kg)  BMI 25.63 kg/m2  LMP 03/02/2012  Neck: supple without masses or thyromegaly  Lungs: clear to auscultation but coarse breath sounds  Heart: regular rate and rhythm  Abdomen: soft, non-tender and no organomegaly  Pelvic:EGBUS- wnl; vagina-normal rugae; uterus-normal size, cervix without lesions or motion tenderness; adnexae-no tenderness or masses  Extremities: no clubbing, cyanosis or edema    Assesment: Menometrorrhagia  Submucosal Fibroid   Disposition: A discussion was held with patient regarding   the indication for her procedure(s) along with the risks, which include but are not limited to: reaction to anesthesia, damage to adjacent organs, infection, excessive bleeding, early menopause, pelvic prolapse and possible need for an open abdominal incision. A Miralax bowel prep was given to patient to complete 24 hours prior  to surgery. Patient verbalized understanding of her risks, along with the procedure and preoperative instructions given. She has consented to proceed to a Total Laparoscopic Hysterectomy with Cystoscopy at [facility] June 15, 2012 at 9:15 a.m.   CSN# [id number]  [name] J. [name], PA-C for Dr. Angela Y Roberts, M.D.         

## 2012-06-22 NOTE — Progress Notes (Addendum)
06/22/2012 Yvonne [name] [id number] Apr 02, 1971   HISTORY OF PRESENT ILLNESS:  Patient is a 41 year old female who had a hysterectomy  one week ago for dysfunctional vaginal bleeding. She was discharged home 10/22 but was readmitted overnight a couple of days later with "ileus" / nausea and vomiting. Contrast CTscan revealed large amount of free intraperitoneal air, nonspecific in the recently postoperative state. Small amount of fluid within the surgical bed. No loculated fluid collection to suggest abscess. Patient's symptoms quickly resolved with IV hydration, antiemetics and analgesics. She was discharged home after tolerating a regular diet. Following that admission patient began having watery stools and abdominal bloating.  She then developed chills / low grade fever and abdominal distention. She was advised by GYN to go to [facility] where her WBC was normal. She was evaluated and sent home on Flagyl and Augmentin. Since then she has been unable to eat without distention and discomfort. She hasn't had any further nausea or vomiting but not really eaten. Patient took Magnesium Citrate yesterday morning as she was concerned that watery stool may be overflow diarrhea from constipation. She began having liquid bowel movements. Today she is still having abdominal distention and loose brown BMs with any oral intake. She is crying, "not used to being sick and never had stomach problems before now". She reports a 14 pound weight loss since 06/15/12.    Past Medical History  Diagnosis Date  . Yeast infection 06/19/2010  . Vulvitis 02/04/11  . Hx: UTI (urinary tract infection) 05/06/11  . H/O menorrhagia 01/16/11  . Fibroid 2005  . Herpes 2009  . HPV in female   . No pertinent past medical history   . PONV (postoperative nausea and vomiting)   . Hyperlipidemia    Past Surgical History  Procedure Date  . Cryotherapy 2009  . Myomectomy 2005  . Cesarean section 2008 & 2009  . Tubal ligation       bilateral  . Laparoscopic hysterectomy 06/15/2012    Procedure: HYSTERECTOMY TOTAL LAPAROSCOPIC;  Surgeon: Purcell Nails, MD;  Location: [facility];  Service: Gynecology;  Laterality: N/A;  . Cystoscopy 06/15/2012    Procedure: CYSTOSCOPY;  Surgeon: Purcell Nails, MD;  Location: [facility];  Service: Gynecology;  Laterality: N/A;    reports that she has never smoked. She has never used smokeless tobacco. She reports that she drinks alcohol. She reports that she does not use illicit drugs. family history includes Alcohol abuse in her brother and father; Crohn's disease in an unspecified family member; Diabetes in her brother and father; Heart attack in her maternal grandfather; Heart disease in her mother; Hypertension in her brothers and mother; and Irritable bowel syndrome in an unspecified family member.  There is no history of Colon cancer. Allergies  Allergen Reactions  . Sulfa Antibiotics Itching      Outpatient Encounter Prescriptions as of 06/22/2012  Medication Sig Dispense Refill  . amoxicillin-clavulanate (AUGMENTIN) 875-125 MG per tablet Take 1 tablet by mouth 2 (two) times daily.  14 tablet  0  . HYDROmorphone (DILAUDID) 2 MG tablet Take 1 tablet (2 mg total) by mouth every 4 (four) hours as needed for pain.  30 tablet  0  . ibuprofen (ADVIL,MOTRIN) 600 MG tablet Take 1 tablet (600 mg total) by mouth every 6 (six) hours as needed (mild pain).  30 tablet  1  . metroNIDAZOLE (FLAGYL) 500 MG tablet Take 1 tablet (500 mg total) by mouth 2 (two) times daily.  14 tablet  0  .  ondansetron (ZOFRAN) 4 MG tablet Take 1 tablet (4 mg total) by mouth every 8 (eight) hours as needed for nausea.  20 tablet  0  . oxyCODONE-acetaminophen (PERCOCET/ROXICET) 5-325 MG per tablet Take 1-2 tablets by mouth every 4 (four) hours as needed (moderate to severe pain (when tolerating fluids)).  30 tablet  0  . niacin-simvastatin (SIMCOR) 500-20 MG 24 hr tablet Take 1 tablet by mouth at bedtime.          REVIEW OF SYSTEMS  : All other systems reviewed and negative except where noted in the History of Present Illness.   PHYSICAL EXAM: BP 130/70  Pulse 70  Ht 5' 4.5" (1.638 m)  Wt 147 lb 9.6 oz (66.951 kg)  BMI 24.94 kg/m2  LMP 05/25/2012 General:  Pleasant, well developed white female in no acute distress Head: Normocephalic and atraumatic Eyes:  sclerae anicteric,conjunctive pink. Ears: Normal auditory acuity Neck: Supple, no masses.  Lungs: Clear throughout to auscultation Heart: Regular rate and rhythm Abdomen: Soft, nontender, non distended. No masses or hepatomegaly noted. Normal bowel sounds Musculoskeletal: Symmetrical with no gross deformities  Skin: No lesions on visible extremities Extremities: No edema  Neurological: Alert oriented x 4, grossly nonfocal Cervical Nodes:  No significant cervical adenopathy Psychological:  Alert and cooperative. Normal mood and affect  ASSESSMENT AND PLAN; 41 year old female with post-prandial abdominal distention and loose stools following hysterectomy one week ago.  Initially had nausea and vomiting which has resolved. Contrast CTscan for nausea and vomiting was non-diagnostic revealing large amount of free intraperitoneal air, nonspecific in the recently postoperative state. Small amount of fluid within the surgical bed. No loculated fluid collection to suggest abscess. Her abdominal exam today is not concerning (she has bowel sounds and isn't significant distended). Recent CBC was normal but still need to exclude c-diff. We will call her with stool study results.   Addendum: Reviewed and agree with initial management. Beverley Fiedler, MD

## 2012-06-22 NOTE — Telephone Encounter (Signed)
Pt has a Hysterectomy on 06/15/12, had post op complications and was placed on Flagyl. She is having problems with loose stools at time, constipation and early satiety. She states she takes 2 bites of food, gets terrible abdominal pains like everything is backing up and stops eating. She reports she has lost 10 pounds since surgery. She called her OB/GYN yesterday and was instructed to use Mag Citrate which gave her watery stools. Pt reports she is very bloated and that's most of her pain. She is taking Ibuprofen only even though she has narcotics. Pt given an appt today with Yvonne Cluster, NP today.

## 2012-06-22 NOTE — Telephone Encounter (Signed)
lmom for pt to call back

## 2012-06-22 NOTE — Telephone Encounter (Signed)
Routed to [employer]

## 2012-06-22 NOTE — Patient Instructions (Addendum)
Please go to the basement level to have your labs drawn.   Hold antibiotics until we get the results back of the stool study. Sip clear liquids to aviod dehydration.

## 2012-06-23 ENCOUNTER — Encounter: Payer: Self-pay | Admitting: Obstetrics and Gynecology

## 2012-06-23 ENCOUNTER — Telehealth: Payer: Self-pay | Admitting: Internal Medicine

## 2012-06-23 ENCOUNTER — Other Ambulatory Visit: Payer: 59

## 2012-06-23 ENCOUNTER — Encounter: Payer: Self-pay | Admitting: Nurse Practitioner

## 2012-06-23 ENCOUNTER — Ambulatory Visit (INDEPENDENT_AMBULATORY_CARE_PROVIDER_SITE_OTHER): Payer: 59 | Admitting: Obstetrics and Gynecology

## 2012-06-23 VITALS — BP 104/72 | Temp 98.2°F | Resp 14 | Ht 65.0 in | Wt 149.0 lb

## 2012-06-23 DIAGNOSIS — R197 Diarrhea, unspecified: Secondary | ICD-10-CM

## 2012-06-23 DIAGNOSIS — Z90710 Acquired absence of both cervix and uterus: Secondary | ICD-10-CM

## 2012-06-23 DIAGNOSIS — R140 Abdominal distension (gaseous): Secondary | ICD-10-CM

## 2012-06-23 DIAGNOSIS — N949 Unspecified condition associated with female genital organs and menstrual cycle: Secondary | ICD-10-CM

## 2012-06-23 DIAGNOSIS — R102 Pelvic and perineal pain unspecified side: Secondary | ICD-10-CM

## 2012-06-23 LAB — POCT URINALYSIS DIPSTICK
Bilirubin, UA: NEGATIVE
Nitrite, UA: NEGATIVE
Urobilinogen, UA: NEGATIVE
pH, UA: 6

## 2012-06-23 MED ORDER — METOCLOPRAMIDE HCL 10 MG PO TABS: 10.0000 mg | ORAL_TABLET | Freq: Four times a day (QID) | ORAL | Status: DC | PRN

## 2012-06-23 NOTE — Telephone Encounter (Signed)
Informed pt it takes a few days to get the CDIFF report back; she stated understanding.

## 2012-06-23 NOTE — Progress Notes (Signed)
C/o loose stools since procedure and pain with eating.  Pt had preop bowel prep, CT scan with po contrast and she drank bottle of mg citrate.  Filed Vitals:   06/23/12 0924  BP: 104/72  Temp: 98.2 F (36.8 C)  Resp: 14   ROS: noncontributory  Pelvic exam:  VULVA: normal appearing vulva with no masses, tenderness or lesions,  VAGINA: normal appearing vagina with normal color and discharge, no lesions, ADNEXA: normal adnexa in size, nontender and no masses.  A/P Stool sent by GI for c-diff If c-diff neg and sxs persist, consider repeat ct scan with po contrast to eval for delayed bowel injury Will follow with pt later this week Trial of reglan Precautions discussed

## 2012-06-25 ENCOUNTER — Telehealth: Payer: Self-pay

## 2012-06-25 NOTE — Telephone Encounter (Signed)
Called pt to check on her last night to see how she is doing. She is noticing more formed stools and seems to be improving. I asked her to call back tomorrow on my direct ext. To report her progress. Pt called back and said she feels much better and stools are still more formed.  Pt was notified that C-Diff stool test was normal. Melody Comas A

## 2012-07-09 ENCOUNTER — Encounter: Payer: Self-pay | Admitting: Obstetrics and Gynecology

## 2012-07-09 ENCOUNTER — Ambulatory Visit (INDEPENDENT_AMBULATORY_CARE_PROVIDER_SITE_OTHER): Payer: 59 | Admitting: Obstetrics and Gynecology

## 2012-07-09 VITALS — BP 102/70 | Temp 99.0°F | Ht 65.0 in | Wt 150.0 lb

## 2012-07-09 DIAGNOSIS — R52 Pain, unspecified: Secondary | ICD-10-CM

## 2012-07-09 MED ORDER — METHYLPREDNISOLONE ACETATE 40 MG/ML IJ SUSP: 20.0000 mg | Freq: Once | INTRAMUSCULAR | Status: DC

## 2012-07-09 NOTE — Progress Notes (Signed)
Contraception:LSC  Hysterectomy 06/15/2012 Dr Su Hilt History of STD:  HSV  History of ovarian cyst: no History of fibroids: yes:   History of endometriosis:no Previous ultrasound:no  Urinary symptoms: none Gastro-intestinal symptoms:  Constipation: yes     Diarrhea: no     Nausea: no     Vomiting: no     Fever: no Vaginal discharge: little spotting since last visit . Pt c/o pain on scale of 8 out of 10. Pt has worse pain at night and reaching. Pain above RLQ incision, sharp burn triggered by mvt   Subjective:     Yvonne [name] is a 41 y.o. female who presents for post-op visit.   The following portions of the patient's history were reviewed and updated as appropriate: allergies, current medications, past family history, past medical history, past social history, past surgical history and problem list.  Review of Systems Pertinent items are noted in HPI.   Objective:    BP 102/70  Temp 99 F (37.2 C)  Ht 5\' 5"  (1.651 m)  Wt 150 lb (68.04 kg)  BMI 24.96 kg/m2  LMP 05/25/2012 Weight:  Wt Readings from Last 1 Encounters:  07/09/12 150 lb (68.04 kg)    BMI: Body mass index is 24.96 kg/(m^2).  General Appearance: Alert, appropriate appearance for age. No acute distress Incision/s: positive triger above right incision. No evidence of hernia   Assessment:   Fascial trigger point post-operatively  Plan:   Reccommended Percocet use at night.  Infiltration discussed. Pt to come back for Depo-Medrol tomorrow   Silverio Lay MD 11/14/20139:38 AM

## 2012-07-10 ENCOUNTER — Ambulatory Visit (INDEPENDENT_AMBULATORY_CARE_PROVIDER_SITE_OTHER): Payer: 59 | Admitting: Obstetrics and Gynecology

## 2012-07-10 ENCOUNTER — Encounter: Payer: Self-pay | Admitting: Obstetrics and Gynecology

## 2012-07-10 VITALS — BP 120/70 | Ht 65.0 in | Wt 150.0 lb

## 2012-07-10 DIAGNOSIS — M629 Disorder of muscle, unspecified: Secondary | ICD-10-CM

## 2012-07-10 DIAGNOSIS — M2420 Disorder of ligament, unspecified site: Secondary | ICD-10-CM

## 2012-07-10 NOTE — Progress Notes (Signed)
Subjective:    Yvonne [name] is a 41 y.o. female, G2P2, who presents for Depo Medrol Infiltration Treatment    Objective:    Trigger point infiltration with 6 cc Lidocaine 1% 4 cc Marcaine 0.25% and Depo-Medrol 20 mg    Assessment:    Post-op incisional trigger point    Plan:    Depo-Medrol infiltration Keep post-op appointment with Dr Hildred Alamin Yvonne Ding MD

## 2012-07-27 ENCOUNTER — Encounter: Payer: 59 | Admitting: Obstetrics and Gynecology

## 2012-07-29 ENCOUNTER — Encounter: Payer: 59 | Admitting: Obstetrics and Gynecology

## 2012-08-10 NOTE — Telephone Encounter (Signed)
Erroneous error Lyndsie Wallman, CNM  

## 2012-09-04 ENCOUNTER — Encounter: Payer: Self-pay | Admitting: Obstetrics and Gynecology

## 2012-09-04 ENCOUNTER — Ambulatory Visit (INDEPENDENT_AMBULATORY_CARE_PROVIDER_SITE_OTHER): Payer: 59 | Admitting: Obstetrics and Gynecology

## 2012-09-04 VITALS — BP 106/64 | Ht 65.0 in | Wt 154.0 lb

## 2012-09-04 DIAGNOSIS — D259 Leiomyoma of uterus, unspecified: Secondary | ICD-10-CM

## 2012-09-04 DIAGNOSIS — D219 Benign neoplasm of connective and other soft tissue, unspecified: Secondary | ICD-10-CM

## 2012-09-04 NOTE — Progress Notes (Signed)
Patient left without being seen after we offered that she be seen by Dr Normand Sloop who was present at the surgery. She said she felt perfectly fine and just left. Will leave message with Dr Hildred Alamin Rivard MD

## 2012-09-13 ENCOUNTER — Telehealth: Payer: Self-pay | Admitting: Obstetrics and Gynecology

## 2012-09-13 NOTE — Telephone Encounter (Signed)
I spoke with patient today (09/13/12) to f/u her post op because she did not keep her post op appt with me.  Her path was benign.  She voiced her concerns about her recovery and says that she is feeling better now eating fine with normal bowel movements.  She had requested her records and will have her annuals done with her family doctor.

## 2012-11-05 ENCOUNTER — Other Ambulatory Visit: Payer: Self-pay

## 2012-11-05 DIAGNOSIS — Z1231 Encounter for screening mammogram for malignant neoplasm of breast: Secondary | ICD-10-CM

## 2012-12-07 ENCOUNTER — Ambulatory Visit
Admission: RE | Admit: 2012-12-07 | Discharge: 2012-12-07 | Disposition: A | Discharge status: Home / Self Care | Payer: BC Managed Care – PPO | Source: Ambulatory Visit

## 2012-12-07 DIAGNOSIS — Z1231 Encounter for screening mammogram for malignant neoplasm of breast: Secondary | ICD-10-CM

## 2013-07-01 ENCOUNTER — Other Ambulatory Visit: Payer: Self-pay

## 2014-04-25 ENCOUNTER — Other Ambulatory Visit: Payer: Self-pay

## 2014-04-25 DIAGNOSIS — Z1231 Encounter for screening mammogram for malignant neoplasm of breast: Secondary | ICD-10-CM

## 2014-06-27 ENCOUNTER — Encounter: Payer: Self-pay | Admitting: Obstetrics and Gynecology

## 2014-07-07 ENCOUNTER — Ambulatory Visit
Admission: RE | Admit: 2014-07-07 | Discharge: 2014-07-07 | Disposition: A | Discharge status: Home / Self Care | Payer: BC Managed Care – PPO | Source: Ambulatory Visit

## 2014-07-07 DIAGNOSIS — Z1231 Encounter for screening mammogram for malignant neoplasm of breast: Secondary | ICD-10-CM

## 2015-07-11 ENCOUNTER — Other Ambulatory Visit: Payer: Self-pay

## 2015-07-11 DIAGNOSIS — Z1231 Encounter for screening mammogram for malignant neoplasm of breast: Secondary | ICD-10-CM

## 2015-07-13 ENCOUNTER — Ambulatory Visit (HOSPITAL_BASED_OUTPATIENT_CLINIC_OR_DEPARTMENT_OTHER): Payer: BLUE CROSS/BLUE SHIELD | Admitting: Hematology & Oncology

## 2015-07-13 ENCOUNTER — Ambulatory Visit: Payer: BLUE CROSS/BLUE SHIELD

## 2015-07-13 ENCOUNTER — Other Ambulatory Visit (HOSPITAL_BASED_OUTPATIENT_CLINIC_OR_DEPARTMENT_OTHER): Payer: BLUE CROSS/BLUE SHIELD

## 2015-07-13 ENCOUNTER — Encounter: Payer: Self-pay | Admitting: Hematology & Oncology

## 2015-07-13 VITALS — BP 133/72 | HR 67 | Temp 97.9°F | Resp 16 | Ht 65.0 in | Wt 134.0 lb

## 2015-07-13 DIAGNOSIS — D72819 Decreased white blood cell count, unspecified: Secondary | ICD-10-CM

## 2015-07-13 LAB — CBC WITH DIFFERENTIAL (CANCER CENTER ONLY)
BASO#: 0 10*3/uL (ref 0.0–0.2)
BASO%: 0.3 % (ref 0.0–2.0)
EOS%: 0.3 % (ref 0.0–7.0)
Eosinophils Absolute: 0 10*3/uL (ref 0.0–0.5)
HEMATOCRIT: 41.6 % (ref 34.8–46.6)
HEMOGLOBIN: 14.7 g/dL (ref 11.6–15.9)
LYMPH#: 1.3 10*3/uL (ref 0.9–3.3)
LYMPH%: 31.5 % (ref 14.0–48.0)
MCH: 32.5 pg (ref 26.0–34.0)
MCHC: 35.3 g/dL (ref 32.0–36.0)
MCV: 92 fL (ref 81–101)
MONO#: 0.3 10*3/uL (ref 0.1–0.9)
MONO%: 7.8 % (ref 0.0–13.0)
NEUT%: 60.1 % (ref 39.6–80.0)
NEUTROS ABS: 2.4 10*3/uL (ref 1.5–6.5)
Platelets: 251 10*3/uL (ref 145–400)
RBC: 4.52 10*6/uL (ref 3.70–5.32)
RDW: 11.9 % (ref 11.1–15.7)
WBC: 4 10*3/uL (ref 3.9–10.0)

## 2015-07-13 LAB — CHCC SATELLITE - SMEAR

## 2015-07-13 NOTE — Progress Notes (Signed)
Hematology and Oncology Follow Up Visit  Yvonne [name] [id number] [birth year] 44 y.o. 07/13/2015   Principle Diagnosis:   Transient leukopenia - ethnic associated leukopenia  Current Therapy:    Observation     Interim History:  Yvonne [name] is a 44 year old African-American female. She's been in good health. She does have herpes simplex. She is on prophylactic Valtrex for this.  She was told about 10 years ago and she had low white cells.  She really has never had problems with infections. She says that during the wintertime she may get a cold.  She has no history of collagen vascular disease. She's had no rashes. She does get her mammograms routinely.  She has had gynecologic surgery. She has had a partial hysterectomy. There she has 2 children. They are healthy.  She was found have a white cell count of 2.7 earlier this year. I think she had a normal white cell differential. The white cell count and went down to 2.3. Again she had a normal white cell differential.  Because of this, it was felt that she needed to be seen by hematology.  She is exercising. She is working.  Overall, her performance status is ECOG is 0.  Medications:  Current outpatient prescriptions:  .  ibuprofen (ADVIL,MOTRIN) 600 MG tablet, Take 1 tablet (600 mg total) by mouth every 6 (six) hours as needed (mild pain)., Disp: 30 tablet, Rfl: 1 .  ipratropium (ATROVENT) 0.03 % nasal spray, , Disp: , Rfl: 2 .  spironolactone (ALDACTONE) 100 MG tablet, , Disp: , Rfl: 0 .  valACYclovir (VALTREX) 500 MG tablet, Take 500 mg by mouth 2 (two) times daily., Disp: , Rfl:   Allergies:  Allergies  Allergen Reactions  . Sulfa Antibiotics Itching    Past Medical History, Surgical history, Social history, and Family History were reviewed and updated.  Review of Systems: As above  Physical Exam:  height is 5\' 5"  (1.651 m) and weight is 134 lb (60.782 kg). Her oral temperature is 97.9 F (36.6 C). Her blood  pressure is 133/72 and her pulse is 67. Her respiration is 16.   Wt Readings from Last 3 Encounters:  07/13/15 134 lb (60.782 kg)  09/04/12 154 lb (69.854 kg)  07/10/12 150 lb (68.04 kg)     Well-developed and well-nourished African-American female in no obvious distress. Head and neck exam shows no ocular or oral lesions. There are no palpable cervical or supraclavicular lymph nodes. Lungs are clear to percussion and auscultation bilaterally. Cardiac exam regular rate and rhythm with no murmurs, rubs or bruits. Abdomen is soft and she has good bowel sounds. There is no fluid wave. There is no palpable liver or spleen tip. Back exam shows no tenderness over the spine, ribs or hips. Extremities shows no clubbing, cyanosis or edema. Skin exam shows no rashes, ecchymoses or petechia.  Lab Results  Component Value Date   WBC 4.0 07/13/2015   HGB 14.7 07/13/2015   HCT 41.6 07/13/2015   MCV 92 07/13/2015   PLT 251 07/13/2015     Chemistry      Component Value Date/Time   NA 140 06/19/2012 2035   K 3.6 06/19/2012 2035   CL 101 06/19/2012 2035   CO2 29 06/19/2012 2035   BUN 4* 06/19/2012 2035   CREATININE 0.56 06/19/2012 2035      Component Value Date/Time   CALCIUM 9.5 06/19/2012 2035   ALKPHOS 38* 06/17/2012 2134   AST 12 06/17/2012 2134   ALT 8  06/17/2012 2134   BILITOT 0.3 06/17/2012 2134     Blood smear shows normochromic and normocytic population of red blood cell. There are no Nicoletta red blood cells. There is no schistocytes or spherocytes. I see no target cells. White cells been normal in morphology maturation. There is no hypersegmented polys. She has no immature myeloid or lymphoid forms. Platelets are adequate in number and size.  Impression and Plan: Yvonne [name] is a 44 year old African-American female with transient leukopenia. I would not think that this is most likely ethnic associated leukopenia. I suppose that also could be ablated to medications. She is on  Valtrex.  Her blood smear is reassuring. Her physical exam does not show anything that looks suspicious.   Today, her blood count is normal. I suspect that she probably will have some fluctuations with her white cell count.  At this point time, I don't see that we have to pursue any invasive studies. I don't see that we have to do any type of scans.  I don't see anything that looks like collagen vascular disease. I do not see anything that looks like vitamin B 12 deficiency.  For now, I do still think we have to see her back in the office. I do still think that we will be adding to her medical care.  I reassured her that I just do not think that she had any hematologic issue that we needed to treat.  Studies have shown that in patients with ethnic associated leukopenia, that there is no increased risk of infections or increased risk of hematologic issues.  I spent about 45 minutes with her. I reviewed the lab work. She left feeling much more confident and much better. She cannot look forward to the holidays.   [name], MD 11/17/20163:00 PM

## 2015-08-07 ENCOUNTER — Ambulatory Visit
Admission: RE | Admit: 2015-08-07 | Discharge: 2015-08-07 | Disposition: A | Discharge status: Home / Self Care | Payer: BLUE CROSS/BLUE SHIELD | Source: Ambulatory Visit

## 2015-08-07 DIAGNOSIS — Z1231 Encounter for screening mammogram for malignant neoplasm of breast: Secondary | ICD-10-CM

## 2015-11-24 LAB — GLUCOSE, POCT (MANUAL RESULT ENTRY): POC Glucose: 95 mg/dl (ref 70–99)

## 2016-01-04 DIAGNOSIS — N7689 Other specified inflammation of vagina and vulva: Secondary | ICD-10-CM | POA: Diagnosis not present

## 2016-02-21 DIAGNOSIS — N390 Urinary tract infection, site not specified: Secondary | ICD-10-CM | POA: Diagnosis not present

## 2016-05-07 DIAGNOSIS — N771 Vaginitis, vulvitis and vulvovaginitis in diseases classified elsewhere: Secondary | ICD-10-CM | POA: Diagnosis not present

## 2016-05-11 DIAGNOSIS — Z23 Encounter for immunization: Secondary | ICD-10-CM | POA: Diagnosis not present

## 2016-05-13 DIAGNOSIS — J3089 Other allergic rhinitis: Secondary | ICD-10-CM | POA: Diagnosis not present

## 2016-05-13 DIAGNOSIS — J019 Acute sinusitis, unspecified: Secondary | ICD-10-CM | POA: Diagnosis not present

## 2016-06-05 DIAGNOSIS — R7309 Other abnormal glucose: Secondary | ICD-10-CM | POA: Diagnosis not present

## 2016-06-05 DIAGNOSIS — E559 Vitamin D deficiency, unspecified: Secondary | ICD-10-CM | POA: Diagnosis not present

## 2016-06-05 DIAGNOSIS — N760 Acute vaginitis: Secondary | ICD-10-CM | POA: Diagnosis not present

## 2016-06-05 DIAGNOSIS — E782 Mixed hyperlipidemia: Secondary | ICD-10-CM | POA: Diagnosis not present

## 2016-06-05 DIAGNOSIS — Z0000 Encounter for general adult medical examination without abnormal findings: Secondary | ICD-10-CM | POA: Diagnosis not present

## 2016-06-27 DIAGNOSIS — Z113 Encounter for screening for infections with a predominantly sexual mode of transmission: Secondary | ICD-10-CM | POA: Diagnosis not present

## 2016-07-26 ENCOUNTER — Other Ambulatory Visit: Payer: Self-pay | Admitting: Nurse Practitioner

## 2016-07-26 DIAGNOSIS — Z1231 Encounter for screening mammogram for malignant neoplasm of breast: Secondary | ICD-10-CM

## 2016-07-30 DIAGNOSIS — N771 Vaginitis, vulvitis and vulvovaginitis in diseases classified elsewhere: Secondary | ICD-10-CM | POA: Diagnosis not present

## 2016-08-06 DIAGNOSIS — Z01419 Encounter for gynecological examination (general) (routine) without abnormal findings: Secondary | ICD-10-CM | POA: Diagnosis not present

## 2016-08-06 DIAGNOSIS — N760 Acute vaginitis: Secondary | ICD-10-CM | POA: Diagnosis not present

## 2016-09-03 ENCOUNTER — Ambulatory Visit
Admission: RE | Admit: 2016-09-03 | Discharge: 2016-09-03 | Disposition: A | Discharge status: Home / Self Care | Payer: BLUE CROSS/BLUE SHIELD | Source: Ambulatory Visit | Attending: Nurse Practitioner | Admitting: Nurse Practitioner

## 2016-09-03 DIAGNOSIS — Z1231 Encounter for screening mammogram for malignant neoplasm of breast: Secondary | ICD-10-CM

## 2016-09-04 ENCOUNTER — Other Ambulatory Visit: Payer: Self-pay | Admitting: Nurse Practitioner

## 2016-09-04 DIAGNOSIS — R928 Other abnormal and inconclusive findings on diagnostic imaging of breast: Secondary | ICD-10-CM

## 2016-09-06 ENCOUNTER — Ambulatory Visit
Admission: RE | Admit: 2016-09-06 | Discharge: 2016-09-06 | Disposition: A | Discharge status: Home / Self Care | Payer: BLUE CROSS/BLUE SHIELD | Source: Ambulatory Visit | Attending: Nurse Practitioner | Admitting: Nurse Practitioner

## 2016-09-06 DIAGNOSIS — R922 Inconclusive mammogram: Secondary | ICD-10-CM | POA: Diagnosis not present

## 2016-09-06 DIAGNOSIS — R928 Other abnormal and inconclusive findings on diagnostic imaging of breast: Secondary | ICD-10-CM

## 2016-10-29 DIAGNOSIS — N898 Other specified noninflammatory disorders of vagina: Secondary | ICD-10-CM | POA: Diagnosis not present

## 2016-10-29 DIAGNOSIS — R300 Dysuria: Secondary | ICD-10-CM | POA: Diagnosis not present

## 2016-11-25 DIAGNOSIS — E782 Mixed hyperlipidemia: Secondary | ICD-10-CM | POA: Diagnosis not present

## 2017-01-22 DIAGNOSIS — Z23 Encounter for immunization: Secondary | ICD-10-CM | POA: Diagnosis not present

## 2017-03-03 DIAGNOSIS — H16042 Marginal corneal ulcer, left eye: Secondary | ICD-10-CM | POA: Diagnosis not present

## 2017-03-03 DIAGNOSIS — H5710 Ocular pain, unspecified eye: Secondary | ICD-10-CM | POA: Diagnosis not present

## 2017-03-05 DIAGNOSIS — H16042 Marginal corneal ulcer, left eye: Secondary | ICD-10-CM | POA: Diagnosis not present

## 2017-04-08 DIAGNOSIS — L700 Acne vulgaris: Secondary | ICD-10-CM | POA: Diagnosis not present

## 2017-05-30 DIAGNOSIS — Z23 Encounter for immunization: Secondary | ICD-10-CM | POA: Diagnosis not present

## 2017-06-09 DIAGNOSIS — J309 Allergic rhinitis, unspecified: Secondary | ICD-10-CM | POA: Diagnosis not present

## 2017-06-09 DIAGNOSIS — Z113 Encounter for screening for infections with a predominantly sexual mode of transmission: Secondary | ICD-10-CM | POA: Diagnosis not present

## 2017-06-09 DIAGNOSIS — Z1231 Encounter for screening mammogram for malignant neoplasm of breast: Secondary | ICD-10-CM | POA: Diagnosis not present

## 2017-06-09 DIAGNOSIS — R7309 Other abnormal glucose: Secondary | ICD-10-CM | POA: Diagnosis not present

## 2017-06-09 DIAGNOSIS — E782 Mixed hyperlipidemia: Secondary | ICD-10-CM | POA: Diagnosis not present

## 2017-06-09 DIAGNOSIS — Z0000 Encounter for general adult medical examination without abnormal findings: Secondary | ICD-10-CM | POA: Diagnosis not present

## 2017-06-09 DIAGNOSIS — M79672 Pain in left foot: Secondary | ICD-10-CM | POA: Diagnosis not present

## 2017-06-11 DIAGNOSIS — L700 Acne vulgaris: Secondary | ICD-10-CM | POA: Diagnosis not present

## 2017-06-30 ENCOUNTER — Ambulatory Visit: Payer: BLUE CROSS/BLUE SHIELD | Admitting: Podiatry

## 2017-06-30 ENCOUNTER — Ambulatory Visit (INDEPENDENT_AMBULATORY_CARE_PROVIDER_SITE_OTHER): Payer: BLUE CROSS/BLUE SHIELD

## 2017-06-30 ENCOUNTER — Other Ambulatory Visit: Payer: Self-pay | Admitting: Podiatry

## 2017-06-30 ENCOUNTER — Encounter: Payer: Self-pay | Admitting: Podiatry

## 2017-06-30 VITALS — BP 106/71 | HR 71 | Resp 16

## 2017-06-30 DIAGNOSIS — M79672 Pain in left foot: Secondary | ICD-10-CM

## 2017-06-30 DIAGNOSIS — M722 Plantar fascial fibromatosis: Secondary | ICD-10-CM

## 2017-06-30 MED ORDER — TRIAMCINOLONE ACETONIDE 10 MG/ML IJ SUSP
10.0000 mg | Freq: Once | INTRAMUSCULAR | Status: AC
  Administered 2017-06-30: 10 mg

## 2017-06-30 MED ORDER — DICLOFENAC SODIUM 75 MG PO TBEC: 75.0000 mg | DELAYED_RELEASE_TABLET | Freq: Two times a day (BID) | ORAL | 2 refills | Status: DC

## 2017-06-30 NOTE — Progress Notes (Signed)
   Subjective:    Patient ID: Yvonne [name], female    DOB: 1971/04/07, 46 y.o.   MRN: [id number]  HPI    Review of Systems  All other systems reviewed and are negative.      Objective:   Physical Exam        Assessment & Plan:

## 2017-06-30 NOTE — Patient Instructions (Signed)

## 2017-07-01 DIAGNOSIS — H16223 Keratoconjunctivitis sicca, not specified as Sjogren's, bilateral: Secondary | ICD-10-CM | POA: Diagnosis not present

## 2017-07-02 NOTE — Progress Notes (Signed)
Subjective:    Patient ID: Yvonne [name], female   DOB: 46 y.o.   MRN: [id number]   HPI patient presents with one-year history of chronic pain in the left heel stating it's been getting worse over that time. Patient states that she's tried wider shoes she's tried ice therapy and support without relief and states that it has made it to the point where she cannot be active and patient states she does not smoke like to be more active    Review of Systems  All other systems reviewed and are negative.       Objective:  Physical Exam  Constitutional: She appears well-developed and well-nourished.  Cardiovascular: Intact distal pulses.  Pulmonary/Chest: Effort normal.  Musculoskeletal: Normal range of motion.  Neurological: She is alert.  Skin: Skin is warm.  Nursing note and vitals reviewed.  neurovascular status intact muscle strength adequate range of motion within normal limits with patient noted to have exquisite discomfort plantar aspect left heel at the insertional point of the tendon into the calcaneus with inflammation fluid around the medial band. Patient's found have moderate depression of the arch and is noted to have swelling in the area of the pain     Assessment:    To plantar fasciitis left with moderate biomechanical implications     Plan:    H&P x-rays reviewed and today I injected the plantar fascial left 3 mg Kenalog 5 g Xylocaine and applied fascial brace with instructions and gave instructions for physical therapy. Patient was placed on diclofenac 75 mg and will be seen back to recheck  X-rays indicate spur formation with depression of the arch noted

## 2017-07-03 ENCOUNTER — Encounter: Payer: Self-pay | Admitting: Hematology & Oncology

## 2017-07-14 ENCOUNTER — Encounter: Payer: Self-pay | Admitting: Podiatry

## 2017-07-14 ENCOUNTER — Ambulatory Visit (INDEPENDENT_AMBULATORY_CARE_PROVIDER_SITE_OTHER): Payer: BLUE CROSS/BLUE SHIELD | Admitting: Podiatry

## 2017-07-14 DIAGNOSIS — M722 Plantar fascial fibromatosis: Secondary | ICD-10-CM

## 2017-07-18 NOTE — Progress Notes (Signed)
Subjective:    Patient ID: Yvonne [name], female   DOB: 46 y.o.   MRN: [id number]   HPI patient presents stating my heel is feeling some better but I have had a long-term year duration of this and I still note pain especially when I get up in the morning and after sitting    ROS      Objective:  Physical Exam neurovascular status intact with patient's heel is improved but still painful upon palpation with moderate depression of the arch     Assessment:    Plantar fasciitis left still noted with improvement but still present     Plan:    H&P condition reviewed and today advised on physical therapy and supportive shoes. I then went ahead and scanned this patient for customized orthotics to reduce plantar pressure against the heel and arch

## 2017-07-29 ENCOUNTER — Other Ambulatory Visit: Payer: Self-pay | Admitting: *Deleted

## 2017-07-29 DIAGNOSIS — D72819 Decreased white blood cell count, unspecified: Secondary | ICD-10-CM

## 2017-07-30 ENCOUNTER — Encounter: Payer: Self-pay | Admitting: Hematology & Oncology

## 2017-07-30 ENCOUNTER — Ambulatory Visit (HOSPITAL_BASED_OUTPATIENT_CLINIC_OR_DEPARTMENT_OTHER): Payer: BLUE CROSS/BLUE SHIELD | Admitting: Hematology & Oncology

## 2017-07-30 ENCOUNTER — Other Ambulatory Visit: Payer: Self-pay

## 2017-07-30 ENCOUNTER — Other Ambulatory Visit: Payer: BLUE CROSS/BLUE SHIELD

## 2017-07-30 VITALS — BP 109/83 | HR 69 | Temp 98.4°F | Resp 20 | Wt 153.0 lb

## 2017-07-30 DIAGNOSIS — D72819 Decreased white blood cell count, unspecified: Secondary | ICD-10-CM

## 2017-07-30 DIAGNOSIS — D513 Other dietary vitamin B12 deficiency anemia: Secondary | ICD-10-CM

## 2017-07-30 DIAGNOSIS — R635 Abnormal weight gain: Secondary | ICD-10-CM | POA: Diagnosis not present

## 2017-07-30 LAB — COMPREHENSIVE METABOLIC PANEL
ALT: 51 U/L (ref 0–55)
ANION GAP: 9 meq/L (ref 3–11)
AST: 33 U/L (ref 5–34)
Albumin: 4.3 g/dL (ref 3.5–5.0)
Alkaline Phosphatase: 53 U/L (ref 40–150)
BUN: 9.3 mg/dL (ref 7.0–26.0)
CHLORIDE: 108 meq/L (ref 98–109)
CO2: 25 meq/L (ref 22–29)
Calcium: 9.2 mg/dL (ref 8.4–10.4)
Creatinine: 0.7 mg/dL (ref 0.6–1.1)
Glucose: 85 mg/dl (ref 70–140)
Potassium: 4.4 mEq/L (ref 3.5–5.1)
Sodium: 141 mEq/L (ref 136–145)
Total Bilirubin: 0.65 mg/dL (ref 0.20–1.20)
Total Protein: 7 g/dL (ref 6.4–8.3)

## 2017-07-30 LAB — CHCC SATELLITE - SMEAR

## 2017-07-30 LAB — CBC WITH DIFFERENTIAL (CANCER CENTER ONLY)
BASO#: 0 10*3/uL (ref 0.0–0.2)
BASO%: 0.4 % (ref 0.0–2.0)
EOS ABS: 0 10*3/uL (ref 0.0–0.5)
EOS%: 0.4 % (ref 0.0–7.0)
HCT: 40.4 % (ref 34.8–46.6)
HGB: 14.3 g/dL (ref 11.6–15.9)
LYMPH#: 0.9 10*3/uL (ref 0.9–3.3)
LYMPH%: 31.5 % (ref 14.0–48.0)
MCH: 32.5 pg (ref 26.0–34.0)
MCHC: 35.4 g/dL (ref 32.0–36.0)
MCV: 92 fL (ref 81–101)
MONO#: 0.3 10*3/uL (ref 0.1–0.9)
MONO%: 12.1 % (ref 0.0–13.0)
NEUT#: 1.5 10*3/uL (ref 1.5–6.5)
NEUT%: 55.6 % (ref 39.6–80.0)
PLATELETS: 235 10*3/uL (ref 145–400)
RBC: 4.4 10*6/uL (ref 3.70–5.32)
RDW: 12.5 % (ref 11.1–15.7)
WBC: 2.7 10*3/uL — AB (ref 3.9–10.0)

## 2017-07-30 LAB — LACTATE DEHYDROGENASE: LDH: 190 U/L (ref 125–245)

## 2017-07-30 LAB — TSH: TSH: 0.877 m[IU]/L (ref 0.308–3.960)

## 2017-07-30 NOTE — Progress Notes (Signed)
Hematology and Oncology Follow Up Visit  Yvonne [name] [id number] 06-08-1971 46 y.o. 07/30/2017   Principle Diagnosis:   Ethnic associated leukopenia (EAL)  Elevated vitamin B12 level  Current Therapy:    Observation     Interim History:  Yvonne [name] is back for a follow-up.  I have not seen her for about 2 years.  She has been doing okay.  Apparently, she had some blood work done by her family doctor.  Unfortunately, I do not have any results back but from what she tells me, her vitamin B12 level was very high.  The lab work that I have on her showed a B12 level over 2000.  This was back in October.  She does have high cholesterol.  She had a CBC done in October which showed a white cell count 2.6.  Hemoglobin 14.8.  Hematocrit 44.8.  Platelet count was 230,000.  The MCV was 94.  She had a normal metabolic panel.  Because of the elevated vitamin B12, she was referred back to the [facility].  She is doing well.  She has had no problems with infections.  She had no adenopathy.  She has had no nausea or vomiting.  She has gained a little bit of weight.  She has not been exercising.  She has 2 young sons.  They are healthy.  She is not taking any vitamin supplements.  She has had no fever.  She has had no cough.  She has had no rashes.  She has had no change in bowel or bladder habits.  She does not have a monthly cycle.  Overall, her performance status is ECOG 0.  Medications:  Current Outpatient Medications:  .  adapalene (DIFFERIN) 0.1 % gel, , Disp: , Rfl: 0 .  diclofenac (VOLTAREN) 75 MG EC tablet, Take 1 tablet (75 mg total) 2 (two) times daily by mouth., Disp: 50 tablet, Rfl: 2 .  erythromycin with ethanol (EMGEL) 2 % gel, APPLY TO AFFECTED AREA TWICE A DAY, Disp: , Rfl: 2 .  ipratropium (ATROVENT) 0.03 % nasal spray, , Disp: , Rfl: 2 .  minocycline (DYNACIN) 100 MG tablet, Take 100 mg 2 (two) times daily by mouth., Disp: , Rfl: 1 .  valACYclovir  (VALTREX) 500 MG tablet, Take 500 mg by mouth 2 (two) times daily., Disp: , Rfl:   Allergies:  Allergies  Allergen Reactions  . Sulfa Antibiotics Itching    Past Medical History, Surgical history, Social history, and Family History were reviewed and updated.  Review of Systems: Review of Systems  Constitutional: Negative for appetite change, fatigue, fever and unexpected weight change.  HENT:   Negative for lump/mass, mouth sores, sore throat and trouble swallowing.   Respiratory: Negative for cough, hemoptysis and shortness of breath.   Cardiovascular: Negative for leg swelling and palpitations.  Gastrointestinal: Negative for abdominal distention, abdominal pain, blood in stool, constipation, diarrhea, nausea and vomiting.  Genitourinary: Negative for bladder incontinence, dysuria, frequency and hematuria.   Musculoskeletal: Negative for arthralgias, back pain, gait problem and myalgias.  Skin: Negative for itching and rash.  Neurological: Negative for dizziness, extremity weakness, gait problem, headaches, numbness, seizures and speech difficulty.  Hematological: Does not bruise/bleed easily.  Psychiatric/Behavioral: Negative for depression and sleep disturbance. The patient is not nervous/anxious.     Physical Exam:  weight is 153 lb (69.4 kg). Her oral temperature is 98.4 F (36.9 C). Her blood pressure is 109/83 and her pulse is 69. Her respiration is 20  and oxygen saturation is 100%.   Wt Readings from Last 3 Encounters:  07/30/17 153 lb (69.4 kg)  07/13/15 134 lb (60.8 kg)  09/04/12 154 lb (69.9 kg)    Physical Exam  Constitutional: She is oriented to person, place, and time.  HENT:  Head: Normocephalic and atraumatic.  Mouth/Throat: Oropharynx is clear and moist.  Eyes: EOM are normal. Pupils are equal, round, and reactive to light.  Neck: Normal range of motion.  Cardiovascular: Normal rate, regular rhythm and normal heart sounds.  Pulmonary/Chest: Effort normal  and breath sounds normal.  Abdominal: Soft. Bowel sounds are normal.  Musculoskeletal: Normal range of motion. She exhibits no edema, tenderness or deformity.  Lymphadenopathy:    She has no cervical adenopathy.  Neurological: She is alert and oriented to person, place, and time.  Skin: Skin is warm and dry. No rash noted. No erythema.  Psychiatric: She has a normal mood and affect. Her behavior is normal. Judgment and thought content normal.  Vitals reviewed.    Lab Results  Component Value Date   WBC 2.7 (L) 07/30/2017   HGB 14.3 07/30/2017   HCT 40.4 07/30/2017   MCV 92 07/30/2017   PLT 235 07/30/2017     Chemistry      Component Value Date/Time   NA 141 07/30/2017 1200   K 4.4 07/30/2017 1200   CL 101 06/19/2012 2035   CO2 25 07/30/2017 1200   BUN 9.3 07/30/2017 1200   CREATININE 0.7 07/30/2017 1200      Component Value Date/Time   CALCIUM 9.2 07/30/2017 1200   ALKPHOS 53 07/30/2017 1200   AST 33 07/30/2017 1200   ALT 51 07/30/2017 1200   BILITOT 0.65 07/30/2017 1200         Impression and Plan: Yvonne [name] is a 46 year old African-American female.  She has ethnic associated leukopenia.  Her white cell count is still on the low side.  I would get her blood under the microscope.  I really do not see anything that looked suspicious.  Her white blood cells were mature.  She had no hypersegmented polys.  There were no immature myeloid or lymphoid cells.  Her red cells showed no rouleaux formation.  There is no inclusion bodies.  I saw no target cells.  Platelets were adequate in size.  She is having an elevated vitamin B12 level does not mean anything for me.  I do not think she has any myeloproliferative disorder.  Nothing on her blood smear, labs, or physical exam shows that there is a bone marrow issue.  She does not need a bone marrow test.  I think that we should just follow her up in about 6 months.  I think if all looks good in 6 months, then we can let her go  from the clinic.  I do still think that we are really going to add much to her medical care.  I spent about 40 minutes with her.  I reassured her that I just did not think anything was going on.  I reviewed her lab work with her.  I answered all of her questions.   [name], MD 12/5/20186:01 PM

## 2017-07-31 ENCOUNTER — Telehealth: Payer: Self-pay | Admitting: *Deleted

## 2017-07-31 LAB — FOLATE RBC
FOLATE, HEMOLYSATE: 440.2 ng/mL
HEMATOCRIT: 40.5 % (ref 34.0–46.6)

## 2017-07-31 LAB — VITAMIN B12: Vitamin B12: 1550 pg/mL — ABNORMAL HIGH (ref 232–1245)

## 2017-07-31 NOTE — Telephone Encounter (Addendum)
Patient is aware of results  ----- Message from [name], MD sent at 07/31/2017  6:49 AM EST ----- Call - your B12 level is only a little above the normal range!!!  Yvonne [name]

## 2017-08-12 ENCOUNTER — Telehealth: Payer: Self-pay | Admitting: *Deleted

## 2017-08-12 ENCOUNTER — Ambulatory Visit: Payer: BLUE CROSS/BLUE SHIELD | Admitting: Orthotics

## 2017-08-12 DIAGNOSIS — M722 Plantar fascial fibromatosis: Secondary | ICD-10-CM

## 2017-08-12 NOTE — Telephone Encounter (Signed)
Pt states she doesn't know how long to use the medication Dr. Paulla Dolly prescribed. I reviewed Medications, pt was prescribed diclofenac with 2 refills. I told pt Liliane Channel - Pedorthist would have her make an a follow-up appt, to continue the diclofenac and if she needed to be seen earlier to call for an appt. Pt states understanding.

## 2017-08-12 NOTE — Progress Notes (Signed)
Patient came in today to pick up custom made foot orthotics.  The goals were accomplished and the patient reported no dissatisfaction with said orthotics.  Patient was advised of breakin period and how to report any issues. 

## 2017-09-16 ENCOUNTER — Other Ambulatory Visit: Payer: Self-pay | Admitting: Nurse Practitioner

## 2017-09-16 DIAGNOSIS — Z1231 Encounter for screening mammogram for malignant neoplasm of breast: Secondary | ICD-10-CM

## 2017-10-08 ENCOUNTER — Ambulatory Visit
Admission: RE | Admit: 2017-10-08 | Discharge: 2017-10-08 | Disposition: A | Discharge status: Home / Self Care | Payer: BLUE CROSS/BLUE SHIELD | Source: Ambulatory Visit | Attending: Nurse Practitioner | Admitting: Nurse Practitioner

## 2017-10-08 DIAGNOSIS — Z1231 Encounter for screening mammogram for malignant neoplasm of breast: Secondary | ICD-10-CM | POA: Diagnosis not present

## 2017-10-14 DIAGNOSIS — L700 Acne vulgaris: Secondary | ICD-10-CM | POA: Diagnosis not present

## 2017-10-22 ENCOUNTER — Other Ambulatory Visit: Payer: Self-pay | Admitting: *Deleted

## 2017-10-22 MED ORDER — DICLOFENAC SODIUM 75 MG PO TBEC: 75.0000 mg | DELAYED_RELEASE_TABLET | Freq: Two times a day (BID) | ORAL | 2 refills | Status: DC

## 2017-10-22 NOTE — Progress Notes (Signed)
[facility] sent a refill request for Diclofenac 75 mg.  Prescription was authorized along with two additional refills.

## 2017-10-23 DIAGNOSIS — R232 Flushing: Secondary | ICD-10-CM | POA: Diagnosis not present

## 2017-10-23 DIAGNOSIS — Z01419 Encounter for gynecological examination (general) (routine) without abnormal findings: Secondary | ICD-10-CM | POA: Diagnosis not present

## 2017-10-27 DIAGNOSIS — H16223 Keratoconjunctivitis sicca, not specified as Sjogren's, bilateral: Secondary | ICD-10-CM | POA: Diagnosis not present

## 2017-12-08 DIAGNOSIS — E782 Mixed hyperlipidemia: Secondary | ICD-10-CM | POA: Diagnosis not present

## 2017-12-08 DIAGNOSIS — H16223 Keratoconjunctivitis sicca, not specified as Sjogren's, bilateral: Secondary | ICD-10-CM | POA: Diagnosis not present

## 2017-12-08 LAB — LIPID PANEL
CHOLESTEROL: 271 — AB (ref 0–200)
HDL: 88 — AB (ref 35–70)
LDL Cholesterol: 173
LDl/HDL Ratio: 2

## 2017-12-08 LAB — BASIC METABOLIC PANEL
BUN: 14 (ref 4–21)
CREATININE: 0.7 (ref 0.5–1.1)
Potassium: 5.3 (ref 3.4–5.3)
Sodium: 143 (ref 137–147)

## 2017-12-18 DIAGNOSIS — N898 Other specified noninflammatory disorders of vagina: Secondary | ICD-10-CM | POA: Diagnosis not present

## 2018-01-28 ENCOUNTER — Inpatient Hospital Stay: Payer: BLUE CROSS/BLUE SHIELD | Attending: Hematology & Oncology | Admitting: Hematology & Oncology

## 2018-01-28 ENCOUNTER — Inpatient Hospital Stay: Payer: BLUE CROSS/BLUE SHIELD

## 2018-01-28 ENCOUNTER — Other Ambulatory Visit: Payer: Self-pay

## 2018-01-28 ENCOUNTER — Encounter: Payer: Self-pay | Admitting: Hematology & Oncology

## 2018-01-28 DIAGNOSIS — R7989 Other specified abnormal findings of blood chemistry: Secondary | ICD-10-CM | POA: Diagnosis not present

## 2018-01-28 DIAGNOSIS — D513 Other dietary vitamin B12 deficiency anemia: Secondary | ICD-10-CM

## 2018-01-28 DIAGNOSIS — D72819 Decreased white blood cell count, unspecified: Secondary | ICD-10-CM | POA: Diagnosis not present

## 2018-01-28 DIAGNOSIS — Z90710 Acquired absence of both cervix and uterus: Secondary | ICD-10-CM

## 2018-01-28 LAB — CBC WITH DIFFERENTIAL (CANCER CENTER ONLY)
BASOS ABS: 0 10*3/uL (ref 0.0–0.1)
BASOS PCT: 0 %
EOS ABS: 0 10*3/uL (ref 0.0–0.5)
Eosinophils Relative: 1 %
HCT: 39.6 % (ref 34.8–46.6)
Hemoglobin: 13.7 g/dL (ref 11.6–15.9)
LYMPHS PCT: 33 %
Lymphs Abs: 0.9 10*3/uL (ref 0.9–3.3)
MCH: 32.8 pg (ref 26.0–34.0)
MCHC: 34.6 g/dL (ref 32.0–36.0)
MCV: 94.7 fL (ref 81.0–101.0)
MONO ABS: 0.3 10*3/uL (ref 0.1–0.9)
Monocytes Relative: 10 %
Neutro Abs: 1.5 10*3/uL (ref 1.5–6.5)
Neutrophils Relative %: 56 %
PLATELETS: 215 10*3/uL (ref 145–400)
RBC: 4.18 MIL/uL (ref 3.70–5.32)
RDW: 12 % (ref 11.1–15.7)
WBC: 2.7 10*3/uL — AB (ref 3.9–10.0)

## 2018-01-28 LAB — SAVE SMEAR

## 2018-01-28 LAB — VITAMIN B12: VITAMIN B 12: 851 pg/mL (ref 180–914)

## 2018-01-28 LAB — PLATELET BY CITRATE

## 2018-01-28 NOTE — Progress Notes (Signed)
Hematology and Oncology Follow Up Visit  Yvonne [name] [id number] 24-Jan-1971 47 y.o. 01/28/2018   Principle Diagnosis:   Ethnic associated leukopenia (EAL)  Elevated vitamin B12  Current Therapy:    Observation     Interim History:  Yvonne [name] is back for follow-up.  We saw her back in December 2018.  Prior to that, it been 2 years that she had been here.  She had no problems over the winter or early spring.  She had no issues with infections.  She had no problems with nausea or vomiting.  She ate well.  She had no change in bowel or bladder habits.  When we saw her back in December, her B12 level was 1550.  I just do not think that this was going to be an issue for her.  We looked at her lab studies with her metabolic panel.  All that looks fine.  She does have cholesterol issues.  Her doctor wants to put her on a statin drug.  She is not too happy about this. I talked to her about this.  I told her that they are important and can really help with lowering cholesterol although they do have side effects.  She is trying to exercise.  She hopefully will be able to exercise a little more this summer.  I told her that exercise can help lower her cholesterol.  Overall, her performance status is ECOG 1.   Medications:  Current Outpatient Medications:  .  adapalene (DIFFERIN) 0.1 % gel, daily. , Disp: , Rfl: 0 .  diclofenac (VOLTAREN) 75 MG EC tablet, Take 1 tablet (75 mg total) by mouth 2 (two) times daily., Disp: 50 tablet, Rfl: 2 .  erythromycin with ethanol (EMGEL) 2 % gel, APPLY TO AFFECTED daily, Disp: , Rfl: 2 .  ipratropium (ATROVENT) 0.03 % nasal spray, Place 1 spray into both nostrils daily. , Disp: , Rfl: 2 .  PARoxetine (PAXIL) 10 MG tablet, TAKE 1/2 TABLET BY MOUTH EVERY DAY MAY INCREASE TO 1 TABLET DAILY, Disp: , Rfl: 3 .  valACYclovir (VALTREX) 500 MG tablet, Take 500 mg by mouth 2 (two) times daily as needed. , Disp: , Rfl:  .  XIIDRA 5 % SOLN, INSTILL 1 DROP INTO  BOTH EYES TWICE A DAY, Disp: , Rfl: 0 .  minocycline (DYNACIN) 100 MG tablet, Take 100 mg 2 (two) times daily by mouth., Disp: , Rfl: 1  Allergies:  Allergies  Allergen Reactions  . Sulfa Antibiotics Itching    Past Medical History, Surgical history, Social history, and Family History were reviewed and updated.  Review of Systems: Review of Systems  Constitutional: Negative.   HENT:  Negative.   Eyes: Negative.   Respiratory: Negative.   Cardiovascular: Negative.   Gastrointestinal: Negative.   Endocrine: Negative.   Genitourinary: Negative.    Musculoskeletal: Negative.   Skin: Negative.   Neurological: Negative.   Hematological: Negative.   Psychiatric/Behavioral: Negative.     Physical Exam:  weight is 157 lb 6.4 oz (71.4 kg). Her oral temperature is 98.2 F (36.8 C). Her blood pressure is 116/75 and her pulse is 56 (abnormal). Her respiration is 20.   Wt Readings from Last 3 Encounters:  01/28/18 157 lb 6.4 oz (71.4 kg)  07/30/17 153 lb (69.4 kg)  07/13/15 134 lb (60.8 kg)    Physical Exam  Constitutional: She is oriented to person, place, and time.  HENT:  Head: Normocephalic and atraumatic.  Mouth/Throat: Oropharynx is clear and moist.  Eyes: Pupils are equal, round, and reactive to light. EOM are normal.  Neck: Normal range of motion.  Cardiovascular: Normal rate, regular rhythm and normal heart sounds.  Pulmonary/Chest: Effort normal and breath sounds normal.  Abdominal: Soft. Bowel sounds are normal.  Musculoskeletal: Normal range of motion. She exhibits no edema, tenderness or deformity.  Lymphadenopathy:    She has no cervical adenopathy.  Neurological: She is alert and oriented to person, place, and time.  Skin: Skin is warm and dry. No rash noted. No erythema.  Psychiatric: She has a normal mood and affect. Her behavior is normal. Judgment and thought content normal.  Vitals reviewed.    Lab Results  Component Value Date   WBC 2.7 (L)  01/28/2018   HGB 13.7 01/28/2018   HCT 39.6 01/28/2018   MCV 94.7 01/28/2018   PLT 215 01/28/2018     Chemistry      Component Value Date/Time   NA 141 07/30/2017 1200   K 4.4 07/30/2017 1200   CL 101 06/19/2012 2035   CO2 25 07/30/2017 1200   BUN 9.3 07/30/2017 1200   CREATININE 0.7 07/30/2017 1200      Component Value Date/Time   CALCIUM 9.2 07/30/2017 1200   ALKPHOS 53 07/30/2017 1200   AST 33 07/30/2017 1200   ALT 51 07/30/2017 1200   BILITOT 0.65 07/30/2017 1200         Impression and Plan: Yvonne [name] is a 47 year old African-American female.  I really do not think that there is a hematologic issue.  Her blood counts are stable.  I do not see anything that looks suspicious with her blood under the microscope.  At this point, I think we can let her go from the clinic.  I do so see that we are really helping her in any way medically.  I would be more than happy to see her back in the future if she does have any problems.     [name], MD 6/5/201911:02 AM

## 2018-02-10 DIAGNOSIS — B379 Candidiasis, unspecified: Secondary | ICD-10-CM | POA: Diagnosis not present

## 2018-02-10 DIAGNOSIS — N941 Unspecified dyspareunia: Secondary | ICD-10-CM | POA: Diagnosis not present

## 2018-02-18 ENCOUNTER — Other Ambulatory Visit: Payer: Self-pay | Admitting: Podiatry

## 2018-02-18 ENCOUNTER — Ambulatory Visit: Payer: BLUE CROSS/BLUE SHIELD | Admitting: Podiatry

## 2018-02-18 ENCOUNTER — Ambulatory Visit (INDEPENDENT_AMBULATORY_CARE_PROVIDER_SITE_OTHER): Payer: BLUE CROSS/BLUE SHIELD

## 2018-02-18 ENCOUNTER — Encounter: Payer: Self-pay | Admitting: Podiatry

## 2018-02-18 DIAGNOSIS — M722 Plantar fascial fibromatosis: Secondary | ICD-10-CM

## 2018-02-18 DIAGNOSIS — M79671 Pain in right foot: Secondary | ICD-10-CM

## 2018-02-18 MED ORDER — TRIAMCINOLONE ACETONIDE 10 MG/ML IJ SUSP
10.0000 mg | Freq: Once | INTRAMUSCULAR | Status: AC
  Administered 2018-02-18: 10 mg

## 2018-02-18 NOTE — Patient Instructions (Signed)

## 2018-02-19 NOTE — Progress Notes (Signed)
Subjective:   Patient ID: Yvonne [name], female   DOB: 47 y.o.   MRN: [id number]   HPI Patient presents stating my left heel is doing pretty well but I developed a lot of pain now in the bottom of my right heel that is making it difficult for me to be active   ROS      Objective:  Physical Exam  Neurovascular status intact with patient's left heel doing very well with quite a bit of pain in the plantar aspect of the right heel at the insertional point tendon calcaneus     Assessment:  Acute plantar fasciitis right with left plantar fascia doing well     Plan:  Discussed the continuation of conservative care and discussed the continuation of anti-inflammatories.  I reinjected the plantar fascia right 3 mg Kenalog 5 mg Xylocaine and I discussed continuation of orthotic usage and will be seen back to reevaluate  X-ray indicates small spur formation with no indications of stress fracture arthritis

## 2018-02-23 ENCOUNTER — Telehealth: Payer: Self-pay | Admitting: Podiatry

## 2018-02-23 NOTE — Telephone Encounter (Signed)
I'm a pt of Dr. Mellody Drown and I saw him last week for service. Something happened to my foot. I took a step yesterday and now I can hardly walk. If someone could give me a call at [phone] and I would like to be seen before my appointment next week. Thank you.

## 2018-02-25 ENCOUNTER — Ambulatory Visit: Payer: BLUE CROSS/BLUE SHIELD | Admitting: Podiatry

## 2018-02-25 DIAGNOSIS — M722 Plantar fascial fibromatosis: Secondary | ICD-10-CM

## 2018-02-25 MED ORDER — DICLOFENAC SODIUM 75 MG PO TBEC: 75.0000 mg | DELAYED_RELEASE_TABLET | Freq: Two times a day (BID) | ORAL | 1 refills | Status: DC

## 2018-02-25 MED ORDER — METHYLPREDNISOLONE 4 MG PO TBPK: ORAL_TABLET | ORAL | 0 refills | Status: DC

## 2018-03-04 ENCOUNTER — Ambulatory Visit: Payer: BLUE CROSS/BLUE SHIELD | Admitting: Podiatry

## 2018-03-04 ENCOUNTER — Encounter: Payer: Self-pay | Admitting: Podiatry

## 2018-03-04 DIAGNOSIS — M722 Plantar fascial fibromatosis: Secondary | ICD-10-CM

## 2018-03-04 MED ORDER — TRIAMCINOLONE ACETONIDE 10 MG/ML IJ SUSP
10.0000 mg | Freq: Once | INTRAMUSCULAR | Status: AC
Start: 2018-03-04 — End: 2018-03-04
  Administered 2018-03-04: 10 mg

## 2018-03-05 NOTE — Progress Notes (Signed)
   Subjective: 47 year old female presenting today for follow up evaluation of plantar fasciitis of the right foot. She received an injection from Dr. Paulla Dolly on 02/18/18 which helped temporarily alleviate the pain. She states she started experiencing sharp pain to the arch of the right foot while exercising four days ago. She has been wearing the fascial brace but was not when the pain started. She is not taking the Diclofenac as directed. Patient is here for further evaluation and treatment.   Past Medical History:  Diagnosis Date  . Fibroid 2005  . H/O menorrhagia 01/16/11  . Herpes 2009  . HPV in female   . Hx: UTI (urinary tract infection) 05/06/11  . Hyperlipidemia   . PONV (postoperative nausea and vomiting)   . Vulvitis 02/04/11  . Yeast infection 06/19/2010     Objective: Physical Exam General: The patient is alert and oriented x3 in no acute distress.  Dermatology: Skin is warm, dry and supple bilateral lower extremities. Negative for open lesions or macerations bilateral.   Vascular: Dorsalis Pedis and Posterior Tibial pulses palpable bilateral.  Capillary fill time is immediate to all digits.  Neurological: Epicritic and protective threshold intact bilateral.   Musculoskeletal: Tenderness to palpation to the plantar aspect of the right heel along the plantar fascia. All other joints range of motion within normal limits bilateral. Strength 5/5 in all groups bilateral.    Assessment: 1. Plantar fasciitis right 2. Pain in right foot  Plan of Care:  1. Patient evaluated. Xrays reviewed.   2. Prescription for Medrol Dose Pak provided to patient.  3. Refill prescription for Diclofenac provided to patient.  4. Continue wearing plantar fascial brace and good shoe gear.  5. Return to clinic at next scheduled appointment with Dr. Paulla Dolly.   Going on a cruise in one week.    Edrick Kins, DPM [facility]  Dr. Edrick Kins, DPM    [address], [place] [address]                Office [phone]  Fax [phone]

## 2018-03-05 NOTE — Progress Notes (Signed)
Subjective:   Patient ID: Yvonne [name], female   DOB: 47 y.o.   MRN: [id number]   HPI Patient presents stating my right foot has been killing me in the arch and heel and getting ready to go on a cruise and I just do not feel like I will be able to walk   ROS      Objective:  Physical Exam  Neurovascular status intact with patient's plantar fascia that appears to be intact with no indication of current tear with pain that is more distal than it was previously within the plantar fascia itself.  It is localized to this area     Assessment:  Plantar fasciitis right with inflammation fluid distal to the insertion of the calcaneus     Plan:  Due to the acute nature of her condition her inability to walk and her needing to be able to walk with her vacation coming I did place her in an air fracture walker to try to mobilize and gave her instructions on aggressive ice therapy.  I did inject any more distal fashion the right plantar fascia 3 mg Kenalog 5 mg Xylocaine advised on reduced activity and again wearing the boot as much as possible and reappoint again in approximately 4 weeks or earlier if needed

## 2018-04-01 ENCOUNTER — Ambulatory Visit: Payer: BLUE CROSS/BLUE SHIELD | Admitting: Podiatry

## 2018-04-01 ENCOUNTER — Encounter: Payer: Self-pay | Admitting: Podiatry

## 2018-04-01 DIAGNOSIS — M722 Plantar fascial fibromatosis: Secondary | ICD-10-CM | POA: Diagnosis not present

## 2018-04-02 NOTE — Progress Notes (Signed)
Subjective:   Patient ID: Yvonne [name], female   DOB: 47 y.o.   MRN: [id number]   HPI Patient states her foot is feeling quite a bit better with occasional discomfort and sharp pains but overall much improved over where it was   ROS      Objective:  Physical Exam  Neurovascular status unchanged with discomfort of the plantar arch right improved and the heel is doing well overall     Assessment:  Appears to be improved with mild mid arch fasciitis right     Plan:  Advised this patient on physical therapy supportive shoes and patient will be seen back to recheck.  Patient overall is doing very well

## 2018-05-24 DIAGNOSIS — Z23 Encounter for immunization: Secondary | ICD-10-CM | POA: Diagnosis not present

## 2018-06-09 DIAGNOSIS — N762 Acute vulvitis: Secondary | ICD-10-CM | POA: Diagnosis not present

## 2018-06-09 DIAGNOSIS — E663 Overweight: Secondary | ICD-10-CM | POA: Diagnosis not present

## 2018-06-09 DIAGNOSIS — N898 Other specified noninflammatory disorders of vagina: Secondary | ICD-10-CM | POA: Diagnosis not present

## 2018-06-17 ENCOUNTER — Encounter: Payer: Self-pay | Admitting: Internal Medicine

## 2018-06-18 ENCOUNTER — Encounter: Payer: Self-pay | Admitting: Nurse Practitioner

## 2018-06-18 ENCOUNTER — Ambulatory Visit: Payer: BLUE CROSS/BLUE SHIELD | Admitting: Nurse Practitioner

## 2018-06-18 VITALS — BP 116/70 | HR 94 | Temp 98.2°F | Ht 64.0 in | Wt 157.0 lb

## 2018-06-18 DIAGNOSIS — Z23 Encounter for immunization: Secondary | ICD-10-CM

## 2018-06-18 DIAGNOSIS — E785 Hyperlipidemia, unspecified: Secondary | ICD-10-CM

## 2018-06-18 DIAGNOSIS — Z0000 Encounter for general adult medical examination without abnormal findings: Secondary | ICD-10-CM

## 2018-06-18 LAB — POCT URINALYSIS DIPSTICK
Bilirubin, UA: NEGATIVE
Blood, UA: NEGATIVE
Glucose, UA: NEGATIVE
Ketones, UA: NEGATIVE
Leukocytes, UA: NEGATIVE
NITRITE UA: NEGATIVE
PROTEIN UA: NEGATIVE
SPEC GRAV UA: 1.015 (ref 1.010–1.025)
Urobilinogen, UA: 0.2 E.U./dL
pH, UA: 7 (ref 5.0–8.0)

## 2018-06-18 MED ORDER — ICOSAPENT ETHYL 0.5 G PO CAPS: 2.0000 g | ORAL_CAPSULE | Freq: Every day | ORAL | 1 refills | Status: DC

## 2018-06-18 NOTE — Progress Notes (Signed)
Subjective:     Patient ID: Yvonne [name] , female    DOB: May 06, 1971 , 47 y.o.   MRN: [id number]   HPI    The patient states she uses status post hysterectomy for birth control. Last LMP was Patient's last menstrual period was 05/25/2012.. Negative for Dysmenorrhea. Negative for: breast discharge, breast lump(s), breast pain and breast self exam. Associated symptoms include abnormal vaginal bleeding. Pertinent negatives include abnormal bleeding (hematology), anxiety, decreased libido, depression, difficulty falling sleep, dyspareunia, history of infertility, nocturia, sexual dysfunction, sleep disturbances, urinary incontinence, urinary urgency, vaginal discharge and vaginal itching. Diet regular.The patient states her exercise level is    . The patient's tobacco use is:  Social History   Tobacco Use  Smoking Status Never Smoker  Smokeless Tobacco Never Used  . She has been exposed to passive smoke. The patient's alcohol use is:  Social History   Substance and Sexual Activity  Alcohol Use Yes  . Alcohol/week: 0.0 standard drinks   Comment: daily  . Additional information: Last pap 2019, next one scheduled for 2021  Past Medical History:  Diagnosis Date  . Fibroid 2005  . H/O menorrhagia 01/16/11  . Herpes 2009  . HPV in female   . Hx: UTI (urinary tract infection) 05/06/11  . Hyperlipidemia   . PONV (postoperative nausea and vomiting)   . Vulvitis 02/04/11  . Yeast infection 06/19/2010      Current Outpatient Medications:  .  adapalene (DIFFERIN) 0.1 % gel, daily. , Disp: , Rfl: 0 .  diclofenac (VOLTAREN) 75 MG EC tablet, Take 1 tablet (75 mg total) by mouth 2 (two) times daily., Disp: 30 tablet, Rfl: 1 .  erythromycin with ethanol (EMGEL) 2 % gel, APPLY TO AFFECTED daily, Disp: , Rfl: 2 .  ipratropium (ATROVENT) 0.03 % nasal spray, Place 1 spray into both nostrils daily. , Disp: , Rfl: 2 .  minocycline (DYNACIN) 100 MG tablet, Take 100 mg 2 (two) times daily by mouth., Disp:  , Rfl: 1 .  valACYclovir (VALTREX) 500 MG tablet, Take 500 mg by mouth 2 (two) times daily as needed. , Disp: , Rfl:  .  XIIDRA 5 % SOLN, INSTILL 1 DROP INTO BOTH EYES TWICE A DAY, Disp: , Rfl: 0 .  Icosapent Ethyl (VASCEPA) 0.5 g CAPS, Take 2 g by mouth daily., Disp: 180 capsule, Rfl: 1   Allergies  Allergen Reactions  . Sulfa Antibiotics Itching     Review of Systems  Constitutional: Negative.   HENT: Negative.   Eyes: Negative.   Respiratory: Negative.   Cardiovascular: Negative.   Gastrointestinal: Negative.   Endocrine: Negative.   Genitourinary: Negative.   Musculoskeletal: Negative.   Allergic/Immunologic: Negative.   Neurological: Negative.   Hematological: Negative.   Psychiatric/Behavioral: Negative.      Today's Vitals   06/18/18 0927  BP: 116/70  Pulse: 94  Temp: 98.2 F (36.8 C)  TempSrc: Oral  SpO2: 94%  Weight: 157 lb (71.2 kg)  Height: 5\' 4"  (1.626 m)  PainSc: 0-No pain   Body mass index is 26.95 kg/m.   Objective:  Physical Exam  Constitutional: She is oriented to person, place, and time. She appears well-developed and well-nourished.  Eyes: Pupils are equal, round, and reactive to light. Conjunctivae and EOM are normal.  Neck: Normal range of motion. Neck supple.  Cardiovascular: Normal rate, regular rhythm, normal heart sounds and intact distal pulses.  Pulmonary/Chest: Effort normal and breath sounds normal.  Abdominal: Soft. Bowel sounds are normal.  Musculoskeletal: Normal range of motion.  Neurological: She is alert and oriented to person, place, and time.  Skin: Skin is warm and dry. Capillary refill takes less than 2 seconds.  Psychiatric: She has a normal mood and affect.  Vitals reviewed.       Assessment And Plan:     1. Encounter for general adult medical examination w/o abnormal findings . Behavior modifications discussed and diet history reviewed.   . Pt will continue to exercise regularly and modify diet with low GI, plant  based foods and decrease intake of processed foods.  . Recommend intake of daily multivitamin, Vitamin D, and calcium.  . Recommend mammogram and colonoscopy for preventive screenings, as well as recommend immunizations that include influenza (declined) and TDAP (UTD) - POCT Urinalysis Dipstick (81002)   2. Hyperlipidemia, unspecified hyperlipidemia type  Chronic, fair control,  Will try vascepa pending insurance coverage - Icosapent Ethyl (VASCEPA) 0.5 g CAPS; Take 2 g by mouth daily.  Dispense: 180 capsule; Refill: 1 - Lipid Profile  3. Need for Tdap vaccination  - Tdap vaccine greater than or equal to 7yo IM   Minette Brine, FNP

## 2018-06-18 NOTE — Patient Instructions (Signed)

## 2018-06-19 LAB — CBC WITH DIFFERENTIAL/PLATELET
Basophils Absolute: 0 10*3/uL (ref 0.0–0.2)
Basos: 1 %
EOS (ABSOLUTE): 0 10*3/uL (ref 0.0–0.4)
Eos: 1 %
HEMATOCRIT: 43.9 % (ref 34.0–46.6)
HEMOGLOBIN: 15.3 g/dL (ref 11.1–15.9)
Immature Grans (Abs): 0 10*3/uL (ref 0.0–0.1)
Immature Granulocytes: 0 %
LYMPHS ABS: 1.1 10*3/uL (ref 0.7–3.1)
LYMPHS: 38 %
MCH: 31.8 pg (ref 26.6–33.0)
MCHC: 34.9 g/dL (ref 31.5–35.7)
MCV: 91 fL (ref 79–97)
MONOCYTES: 12 %
Monocytes Absolute: 0.4 10*3/uL (ref 0.1–0.9)
NEUTROS PCT: 48 %
Neutrophils Absolute: 1.5 10*3/uL (ref 1.4–7.0)
Platelets: 289 10*3/uL (ref 150–450)
RBC: 4.81 x10E6/uL (ref 3.77–5.28)
RDW: 11.7 % — ABNORMAL LOW (ref 12.3–15.4)
WBC: 3 10*3/uL — AB (ref 3.4–10.8)

## 2018-06-19 LAB — CMP14 + ANION GAP
A/G RATIO: 2.1 (ref 1.2–2.2)
ALBUMIN: 4.9 g/dL (ref 3.5–5.5)
ALK PHOS: 50 IU/L (ref 39–117)
ALT: 18 IU/L (ref 0–32)
AST: 17 IU/L (ref 0–40)
Anion Gap: 16 mmol/L (ref 10.0–18.0)
BILIRUBIN TOTAL: 0.5 mg/dL (ref 0.0–1.2)
BUN/Creatinine Ratio: 19 (ref 9–23)
BUN: 14 mg/dL (ref 6–24)
CHLORIDE: 101 mmol/L (ref 96–106)
CO2: 23 mmol/L (ref 20–29)
Calcium: 10 mg/dL (ref 8.7–10.2)
Creatinine, Ser: 0.72 mg/dL (ref 0.57–1.00)
GFR calc Af Amer: 114 mL/min/{1.73_m2} (ref >59)
GFR, EST NON AFRICAN AMERICAN: 99 mL/min/{1.73_m2} (ref >59)
GLUCOSE: 101 mg/dL — AB (ref 65–99)
Globulin, Total: 2.3 g/dL (ref 1.5–4.5)
Potassium: 4.8 mmol/L (ref 3.5–5.2)
SODIUM: 140 mmol/L (ref 134–144)
TOTAL PROTEIN: 7.2 g/dL (ref 6.0–8.5)

## 2018-06-19 LAB — LIPID PANEL
CHOLESTEROL TOTAL: 313 mg/dL — AB (ref 100–199)
Chol/HDL Ratio: 3.6 ratio (ref 0.0–4.4)
HDL: 87 mg/dL (ref >39)
LDL Calculated: 214 mg/dL — ABNORMAL HIGH (ref 0–99)
TRIGLYCERIDES: 61 mg/dL (ref 0–149)
VLDL CHOLESTEROL CAL: 12 mg/dL (ref 5–40)

## 2018-06-19 LAB — VITAMIN D 25 HYDROXY (VIT D DEFICIENCY, FRACTURES): Vit D, 25-Hydroxy: 27 ng/mL — ABNORMAL LOW (ref 30.0–100.0)

## 2018-06-24 DIAGNOSIS — E663 Overweight: Secondary | ICD-10-CM | POA: Diagnosis not present

## 2018-08-11 DIAGNOSIS — N760 Acute vaginitis: Secondary | ICD-10-CM | POA: Diagnosis not present

## 2018-08-11 DIAGNOSIS — Z713 Dietary counseling and surveillance: Secondary | ICD-10-CM | POA: Diagnosis not present

## 2018-08-11 DIAGNOSIS — E7800 Pure hypercholesterolemia, unspecified: Secondary | ICD-10-CM | POA: Diagnosis not present

## 2018-08-31 DIAGNOSIS — S0502XA Injury of conjunctiva and corneal abrasion without foreign body, left eye, initial encounter: Secondary | ICD-10-CM | POA: Diagnosis not present

## 2018-08-31 DIAGNOSIS — H16223 Keratoconjunctivitis sicca, not specified as Sjogren's, bilateral: Secondary | ICD-10-CM | POA: Diagnosis not present

## 2018-10-19 ENCOUNTER — Other Ambulatory Visit: Payer: Self-pay | Admitting: Nurse Practitioner

## 2018-10-19 DIAGNOSIS — Z1231 Encounter for screening mammogram for malignant neoplasm of breast: Secondary | ICD-10-CM

## 2018-11-07 ENCOUNTER — Other Ambulatory Visit: Payer: Self-pay | Admitting: Nurse Practitioner

## 2018-11-19 ENCOUNTER — Ambulatory Visit: Payer: BLUE CROSS/BLUE SHIELD

## 2018-11-24 ENCOUNTER — Ambulatory Visit: Payer: BLUE CROSS/BLUE SHIELD

## 2018-11-26 ENCOUNTER — Other Ambulatory Visit: Payer: Self-pay | Admitting: Nurse Practitioner

## 2018-12-15 ENCOUNTER — Encounter: Payer: Self-pay | Admitting: Nurse Practitioner

## 2018-12-15 ENCOUNTER — Ambulatory Visit: Payer: BLUE CROSS/BLUE SHIELD | Admitting: Nurse Practitioner

## 2018-12-15 ENCOUNTER — Other Ambulatory Visit: Payer: Self-pay

## 2018-12-15 VITALS — BP 112/73

## 2018-12-15 DIAGNOSIS — E782 Mixed hyperlipidemia: Secondary | ICD-10-CM | POA: Diagnosis not present

## 2018-12-15 DIAGNOSIS — F329 Major depressive disorder, single episode, unspecified: Secondary | ICD-10-CM | POA: Diagnosis not present

## 2018-12-15 DIAGNOSIS — F32A Depression, unspecified: Secondary | ICD-10-CM | POA: Insufficient documentation

## 2018-12-15 MED ORDER — VALACYCLOVIR HCL 1 G PO TABS: 1000.0000 mg | ORAL_TABLET | Freq: Two times a day (BID) | ORAL | 0 refills | Status: DC

## 2018-12-15 MED ORDER — BUPROPION HCL 75 MG PO TABS: 75.0000 mg | ORAL_TABLET | Freq: Every day | ORAL | 1 refills | Status: DC

## 2018-12-15 NOTE — Progress Notes (Signed)
Virtual Visit via Video Note   This visit type was conducted due to national recommendations for restrictions regarding the COVID-19 Pandemic (e.g. social distancing) in an effort to limit this patient's exposure and mitigate transmission in our community.  This format is felt to be most appropriate for this patient at this time.  All issues noted in this document were discussed and addressed.  No physical exam was performed (except for noted visual exam findings with Video Visits).  Please refer to the patient's chart (MyChart message for video visits and phone note for telephone visits) for the patient's consent to telehealth for [facility].  Date:  01/03/2019   ID:  Glennice Marcos, DOB [birth year], MRN [id number]  Patient Location:  Home  Provider location:   Office    Chief Complaint:  Hyperlipidemia follow up  History of Present Illness:    Yvonne [name] is a 48 y.o. female who presents via video conferencing for a telehealth visit today.    The patient does not have symptoms concerning for COVID-19 infection (fever, chills, cough, or new shortness of breath).   Hyperlipidemia - she has been not eating well since she has been under quarantined.           Depression         This is a new problem.  The current episode started more than 1 month ago.   The onset quality is gradual.   Associated symptoms include insomnia.  Associated symptoms include no fatigue and no hopelessness.  Treatments tried: was on Fluoxetine.    Past Medical History:  Diagnosis Date  . Fibroid 2005  . H/O menorrhagia 01/16/11  . Herpes 2009  . HPV in female   . Hx: UTI (urinary tract infection) 05/06/11  . Hyperlipidemia   . PONV (postoperative nausea and vomiting)   . Vulvitis 02/04/11  . Yeast infection 06/19/2010   Past Surgical History:  Procedure Laterality Date  . CESAREAN SECTION  2008 & 2009  . CRYOTHERAPY  2009  . CYSTOSCOPY  06/15/2012   Procedure: CYSTOSCOPY;  Surgeon: Delice Lesch, MD;  Location: [facility];  Service: Gynecology;  Laterality: N/A;  . LAPAROSCOPIC HYSTERECTOMY  06/15/2012   Procedure: HYSTERECTOMY TOTAL LAPAROSCOPIC;  Surgeon: Delice Lesch, MD;  Location: [facility];  Service: Gynecology;  Laterality: N/A;  . MYOMECTOMY  2005  . TUBAL LIGATION     bilateral     Current Meds  Medication Sig  . adapalene (DIFFERIN) 0.1 % gel daily.   . diclofenac (VOLTAREN) 75 MG EC tablet Take 1 tablet (75 mg total) by mouth 2 (two) times daily.  Marland Kitchen erythromycin with ethanol (EMGEL) 2 % gel APPLY TO AFFECTED daily  . ipratropium (ATROVENT) 0.03 % nasal spray Place 1 spray into both nostrils daily.   Marland Kitchen XIIDRA 5 % SOLN INSTILL 1 DROP INTO BOTH EYES TWICE A DAY  . [DISCONTINUED] valACYclovir (VALTREX) 1000 MG tablet TAKE 1 TABLET BY MOUTH EVERY 12 HOURS     Allergies:   Sulfa antibiotics   Social History   Tobacco Use  . Smoking status: Never Smoker  . Smokeless tobacco: Never Used  Substance Use Topics  . Alcohol use: Yes    Alcohol/week: 0.0 standard drinks    Comment: daily  . Drug use: No     Family Hx: The patient's family history includes Alcohol abuse in her brother and father; Crohn's disease in her unknown relative; Diabetes in her brother and father; Heart attack in her maternal  grandfather; Heart disease in her mother; Hypertension in her brother, brother, and mother; Irritable bowel syndrome in her unknown relative. There is no history of Colon cancer or Breast cancer.  ROS:   Please see the history of present illness.    Review of Systems  Constitutional: Negative.  Negative for fatigue.  Respiratory: Negative.   Cardiovascular: Negative.   Psychiatric/Behavioral: Positive for depression. The patient has insomnia.     All other systems reviewed and are negative.   Labs/Other Tests and Data Reviewed:    Recent Labs: 06/18/2018: ALT 18; BUN 14; Creatinine, Ser 0.72; Hemoglobin 15.3; Platelets 289; Potassium 4.8; Sodium 140   Recent  Lipid Panel Lab Results  Component Value Date/Time   CHOL 313 (H) 06/18/2018 10:53 AM   TRIG 61 06/18/2018 10:53 AM   HDL 87 06/18/2018 10:53 AM   CHOLHDL 3.6 06/18/2018 10:53 AM   LDLCALC 214 (H) 06/18/2018 10:53 AM    Wt Readings from Last 3 Encounters:  06/18/18 157 lb (71.2 kg)  12/08/17 157 lb (71.2 kg)  01/28/18 157 lb 6.4 oz (71.4 kg)     Exam:    Vital Signs:  BP 112/73   LMP 05/25/2012     Physical Exam  Constitutional: She is well-developed, well-nourished, and in no distress. No distress.    ASSESSMENT & PLAN:    1. Mixed hyperlipidemia  Will check lipids in 4 weeks  Continue with medications  Increase physical activity   2. Depression, unspecified depression type  Depression screen score 15, has increased stress due to Quarantine and responsibility for home schooling and caring for her mom  Has had increase in weight of 13 lbs since last visit  Will try her on wellbutrin daily  Will also check on resources for mental health - buPROPion (WELLBUTRIN) 75 MG tablet; Take 1 tablet (75 mg total) by mouth daily.  Dispense: 30 tablet; Refill: 1   COVID-19 Education: The signs and symptoms of COVID-19 were discussed with the patient and how to seek care for testing (follow up with PCP or arrange E-visit).  The importance of social distancing was discussed today.  Patient Risk:   After full review of this patients clinical status, I feel that they are at least moderate risk at this time.  Time:   Today, I have spent 20 minutes/ seconds with the patient with telehealth technology discussing above diagnoses.     Medication Adjustments/Labs and Tests Ordered: Current medicines are reviewed at length with the patient today.  Concerns regarding medicines are outlined above.   Tests Ordered: No orders of the defined types were placed in this encounter.   Medication Changes: Meds ordered this encounter  Medications  . buPROPion (WELLBUTRIN) 75 MG tablet     Sig: Take 1 tablet (75 mg total) by mouth daily.    Dispense:  30 tablet    Refill:  1    Disposition:  Follow up 4 weeks medication check  Signed, Minette Brine, FNP

## 2018-12-18 ENCOUNTER — Ambulatory Visit: Payer: BLUE CROSS/BLUE SHIELD | Admitting: Nurse Practitioner

## 2018-12-30 ENCOUNTER — Ambulatory Visit: Payer: BLUE CROSS/BLUE SHIELD

## 2019-01-06 ENCOUNTER — Other Ambulatory Visit: Payer: Self-pay | Admitting: Nurse Practitioner

## 2019-01-06 DIAGNOSIS — F329 Major depressive disorder, single episode, unspecified: Secondary | ICD-10-CM

## 2019-01-06 DIAGNOSIS — F32A Depression, unspecified: Secondary | ICD-10-CM

## 2019-01-13 ENCOUNTER — Ambulatory Visit (INDEPENDENT_AMBULATORY_CARE_PROVIDER_SITE_OTHER): Payer: BLUE CROSS/BLUE SHIELD | Admitting: Nurse Practitioner

## 2019-01-13 ENCOUNTER — Other Ambulatory Visit: Payer: Self-pay

## 2019-01-13 ENCOUNTER — Encounter: Payer: Self-pay | Admitting: Nurse Practitioner

## 2019-01-13 ENCOUNTER — Ambulatory Visit: Payer: BLUE CROSS/BLUE SHIELD | Admitting: Nurse Practitioner

## 2019-01-13 VITALS — BP 125/78 | HR 73 | Temp 98.1°F | Ht 65.0 in | Wt 179.0 lb

## 2019-01-13 DIAGNOSIS — R5383 Other fatigue: Secondary | ICD-10-CM | POA: Diagnosis not present

## 2019-01-13 DIAGNOSIS — E782 Mixed hyperlipidemia: Secondary | ICD-10-CM

## 2019-01-13 DIAGNOSIS — F32A Depression, unspecified: Secondary | ICD-10-CM

## 2019-01-13 DIAGNOSIS — F329 Major depressive disorder, single episode, unspecified: Secondary | ICD-10-CM

## 2019-01-13 MED ORDER — BUPROPION HCL ER (SR) 150 MG PO TB12: 150.0000 mg | ORAL_TABLET | Freq: Every day | ORAL | 1 refills | Status: DC

## 2019-01-13 NOTE — Progress Notes (Signed)
Virtual Visit via Video   This visit type was conducted due to national recommendations for restrictions regarding the COVID-19 Pandemic (e.g. social distancing) in an effort to limit this patient's exposure and mitigate transmission in our community.  Patients identity confirmed using two different identifiers.  This format is felt to be most appropriate for this patient at this time.  All issues noted in this document were discussed and addressed.  No physical exam was performed (except for noted visual exam findings with Video Visits).    Date:  01/13/2019   ID:  Yvonne [name], DOB 06-14-1971, MRN [id number]  Patient Location:  Home - spoke with Regino Schultze  Provider location:   Office    Chief Complaint:  Follow up new med  History of Present Illness:    Yvonne [name] is a 48 y.o. female who presents via video conferencing for a telehealth visit today.    The patient does not have symptoms concerning for COVID-19 infection (fever, chills, cough, or new shortness of breath).   Depression         This is a new problem.  The current episode started 1 to 4 weeks ago.   The onset quality is gradual.   The problem has been gradually improving since onset.  Associated symptoms include no decreased concentration and no fatigue.     Exacerbated by: stress with Pandemic   Past treatments include SSRIs - Selective serotonin reuptake inhibitors.  Compliance with treatment is good.  Previous treatment provided mild relief.    Past Medical History:  Diagnosis Date  . Fibroid 2005  . H/O menorrhagia 01/16/11  . Herpes 2009  . HPV in female   . Hx: UTI (urinary tract infection) 05/06/11  . Hyperlipidemia   . PONV (postoperative nausea and vomiting)   . Vulvitis 02/04/11  . Yeast infection 06/19/2010   Past Surgical History:  Procedure Laterality Date  . CESAREAN SECTION  2008 & 2009  . CRYOTHERAPY  2009  . CYSTOSCOPY  06/15/2012   Procedure: CYSTOSCOPY;  Surgeon: Delice Lesch,  MD;  Location: [facility];  Service: Gynecology;  Laterality: N/A;  . LAPAROSCOPIC HYSTERECTOMY  06/15/2012   Procedure: HYSTERECTOMY TOTAL LAPAROSCOPIC;  Surgeon: Delice Lesch, MD;  Location: [facility];  Service: Gynecology;  Laterality: N/A;  . MYOMECTOMY  2005  . TUBAL LIGATION     bilateral     Current Meds  Medication Sig  . buPROPion (WELLBUTRIN) 75 MG tablet TAKE 1 TABLET BY MOUTH EVERY DAY  . diclofenac (VOLTAREN) 75 MG EC tablet Take 1 tablet (75 mg total) by mouth 2 (two) times daily.  Marland Kitchen ipratropium (ATROVENT) 0.03 % nasal spray Place 1 spray into both nostrils daily.   . valACYclovir (VALTREX) 1000 MG tablet Take 1 tablet (1,000 mg total) by mouth every 12 (twelve) hours.     Allergies:   Sulfa antibiotics   Social History   Tobacco Use  . Smoking status: Never Smoker  . Smokeless tobacco: Never Used  Substance Use Topics  . Alcohol use: Yes    Alcohol/week: 0.0 standard drinks    Comment: daily  . Drug use: No     Family Hx: The patient's family history includes Alcohol abuse in her brother and father; Crohn's disease in her unknown relative; Diabetes in her brother and father; Heart attack in her maternal grandfather; Heart disease in her mother; Hypertension in her brother, brother, and mother; Irritable bowel syndrome in her unknown relative. There is no history  of Colon cancer or Breast cancer.  ROS:   Please see the history of present illness.    Review of Systems  Constitutional: Negative.  Negative for fatigue.  Respiratory: Negative.   Cardiovascular: Negative.   Neurological: Negative for dizziness.  Psychiatric/Behavioral: Positive for depression. Negative for decreased concentration.    All other systems reviewed and are negative.   Labs/Other Tests and Data Reviewed:    Recent Labs: 06/18/2018: ALT 18; BUN 14; Creatinine, Ser 0.72; Hemoglobin 15.3; Platelets 289; Potassium 4.8; Sodium 140   Recent Lipid Panel Lab Results  Component Value  Date/Time   CHOL 313 (H) 06/18/2018 10:53 AM   TRIG 61 06/18/2018 10:53 AM   HDL 87 06/18/2018 10:53 AM   CHOLHDL 3.6 06/18/2018 10:53 AM   LDLCALC 214 (H) 06/18/2018 10:53 AM    Wt Readings from Last 3 Encounters:  01/13/19 179 lb (81.2 kg)  06/18/18 157 lb (71.2 kg)  12/08/17 157 lb (71.2 kg)     Exam:    Vital Signs:  BP 125/78 (BP Location: Left Arm, Patient Position: Sitting, Cuff Size: Small)   Pulse 73   Temp 98.1 F (36.7 C) (Oral)   Ht 5\' 5"  (1.651 m)   Wt 179 lb (81.2 kg)   LMP 05/25/2012   BMI 29.79 kg/m     Physical Exam  Constitutional: She is oriented to person, place, and time and well-developed, well-nourished, and in no distress.  Neurological: She is alert and oriented to person, place, and time.  Psychiatric: Mood, memory, affect and judgment normal.  She is smiling more this visit    ASSESSMENT & PLAN:    1. Mixed hyperlipidemia  She will come and get her labs this week - Lipid Profile  2. Depression, unspecified depression type  Improving but feel we need to increase  She is tolerating well - buPROPion (WELLBUTRIN SR) 150 MG 12 hr tablet; Take 1 tablet (150 mg total) by mouth daily.  Dispense: 90 tablet; Refill: 1  3. Fatigue, unspecified type  Metabolic issue vs depression vs deconditioning - TSH   COVID-19 Education: The signs and symptoms of COVID-19 were discussed with the patient and how to seek care for testing (follow up with PCP or arrange E-visit).  The importance of social distancing was discussed today.  Patient Risk:   After full review of this patients clinical status, I feel that they are at least moderate risk at this time.  Time:   Today, I have spent 11 minutes/ seconds with the patient with telehealth technology discussing above diagnoses.     Medication Adjustments/Labs and Tests Ordered: Current medicines are reviewed at length with the patient today.  Concerns regarding medicines are outlined above.   Tests  Ordered: No orders of the defined types were placed in this encounter.   Medication Changes: No orders of the defined types were placed in this encounter.   Disposition:  Follow up in 3 month(s)  Signed, Minette Brine, FNP

## 2019-01-14 ENCOUNTER — Other Ambulatory Visit: Payer: BLUE CROSS/BLUE SHIELD

## 2019-01-14 ENCOUNTER — Other Ambulatory Visit: Payer: Self-pay

## 2019-01-14 DIAGNOSIS — E782 Mixed hyperlipidemia: Secondary | ICD-10-CM | POA: Diagnosis not present

## 2019-01-14 DIAGNOSIS — R5383 Other fatigue: Secondary | ICD-10-CM | POA: Diagnosis not present

## 2019-01-15 LAB — LIPID PANEL
Chol/HDL Ratio: 3.5 ratio (ref 0.0–4.4)
Cholesterol, Total: 248 mg/dL — ABNORMAL HIGH (ref 100–199)
HDL: 70 mg/dL (ref >39)
LDL Calculated: 164 mg/dL — ABNORMAL HIGH (ref 0–99)
Triglycerides: 72 mg/dL (ref 0–149)
VLDL Cholesterol Cal: 14 mg/dL (ref 5–40)

## 2019-01-15 LAB — TSH: TSH: 1.46 u[IU]/mL (ref 0.450–4.500)

## 2019-01-22 ENCOUNTER — Encounter: Payer: Self-pay | Admitting: Nurse Practitioner

## 2019-01-28 ENCOUNTER — Other Ambulatory Visit: Payer: Self-pay

## 2019-01-28 ENCOUNTER — Ambulatory Visit
Admission: RE | Admit: 2019-01-28 | Discharge: 2019-01-28 | Disposition: A | Discharge status: Home / Self Care | Payer: BC Managed Care – PPO | Source: Ambulatory Visit | Attending: Nurse Practitioner | Admitting: Nurse Practitioner

## 2019-01-28 ENCOUNTER — Encounter: Payer: Self-pay | Admitting: Nurse Practitioner

## 2019-01-28 ENCOUNTER — Ambulatory Visit (INDEPENDENT_AMBULATORY_CARE_PROVIDER_SITE_OTHER): Payer: BC Managed Care – PPO | Admitting: Nurse Practitioner

## 2019-01-28 VITALS — BP 120/78 | HR 72 | Temp 98.9°F | Ht 65.0 in | Wt 177.6 lb

## 2019-01-28 DIAGNOSIS — M25462 Effusion, left knee: Secondary | ICD-10-CM | POA: Diagnosis not present

## 2019-01-28 DIAGNOSIS — M25562 Pain in left knee: Secondary | ICD-10-CM | POA: Diagnosis not present

## 2019-01-28 MED ORDER — KETOROLAC TROMETHAMINE 30 MG/ML IJ SOLN
30.0000 mg | Freq: Once | INTRAMUSCULAR | Status: AC
  Administered 2019-01-28: 30 mg via INTRAMUSCULAR

## 2019-01-28 NOTE — Progress Notes (Signed)
Subjective:     Patient ID: Yvonne [name] , female    DOB: 1971-04-01 , 47 y.o.   MRN: [id number]   Chief Complaint  Patient presents with  . Edema    patient states her knee has been swelling since march and it has been getting worse and hurts at night     HPI  Knee Pain   The incident occurred more than 1 week ago. There was no injury mechanism. The pain is present in the left knee. The quality of the pain is described as aching (swelling). The pain is mild (pain awakens her at night worse in the last 2-3 weeks). The pain has been constant since onset. Associated symptoms include an inability to bear weight and a loss of motion (decreased range of motion). Pertinent negatives include no loss of sensation, numbness or tingling. She has tried NSAIDs and elevation for the symptoms. The treatment provided mild relief.     Past Medical History:  Diagnosis Date  . Fibroid 2005  . H/O menorrhagia 01/16/11  . Herpes 2009  . HPV in female   . Hx: UTI (urinary tract infection) 05/06/11  . Hyperlipidemia   . PONV (postoperative nausea and vomiting)   . Vulvitis 02/04/11  . Yeast infection 06/19/2010     Family History  Problem Relation Age of Onset  . Hypertension Mother   . Heart disease Mother   . Diabetes Father   . Alcohol abuse Father   . Diabetes Brother   . Hypertension Brother   . Alcohol abuse Brother   . Heart attack Maternal Grandfather   . Hypertension Brother   . Crohn's disease Unknown        mat 2nd cousin  . Irritable bowel syndrome Unknown        mat. nephew  . Colon cancer Neg Hx   . Breast cancer Neg Hx      Current Outpatient Medications:  .  buPROPion (WELLBUTRIN SR) 150 MG 12 hr tablet, Take 1 tablet (150 mg total) by mouth daily., Disp: 90 tablet, Rfl: 1 .  ipratropium (ATROVENT) 0.03 % nasal spray, Place 1 spray into both nostrils daily. , Disp: , Rfl: 2 .  valACYclovir (VALTREX) 1000 MG tablet, Take 1 tablet (1,000 mg total) by mouth every 12 (twelve)  hours., Disp: 60 tablet, Rfl: 0 .  XIIDRA 5 % SOLN, INSTILL 1 DROP INTO BOTH EYES TWICE A DAY, Disp: , Rfl: 0  Current Facility-Administered Medications:  .  ketorolac (TORADOL) 30 MG/ML injection 30 mg, 30 mg, Intramuscular, Once, Minette Brine, FNP   Allergies  Allergen Reactions  . Sulfa Antibiotics Itching     Review of Systems  Constitutional: Negative.   Neurological: Negative for tingling and numbness.     Today's Vitals   01/28/19 1155  BP: 120/78  Pulse: 72  Temp: 98.9 F (37.2 C)  Weight: 177 lb 9.6 oz (80.6 kg)  Height: 5\' 5"  (1.651 m)  PainSc: 4   PainLoc: Knee   Body mass index is 29.55 kg/m.   Objective:  Physical Exam Constitutional:      Appearance: Normal appearance.  Cardiovascular:     Heart sounds: No murmur.  Musculoskeletal:        General: Swelling (left knee above her patella, negative ballotment and drawer test) and tenderness (left knee above her patella) present.  Skin:    Capillary Refill: Capillary refill takes less than 2 seconds.  Neurological:     General: No focal deficit present.  Mental Status: She is alert.  Psychiatric:        Mood and Affect: Mood normal.        Thought Content: Thought content normal.        Judgment: Judgment normal.         Assessment And Plan:     1. Acute pain of left knee  Swelling noted proximal of her patella with tenderness  Negative drawer and ballotment noted  Encouraged to keep elevated when possible  Depending on xray will refer to orthopedic she may need fluid removal  - DG Knee Complete 4 Views Left; Future - ketorolac (TORADOL) 30 MG/ML injection 30 mg        Minette Brine, FNP    THE PATIENT IS ENCOURAGED TO PRACTICE SOCIAL DISTANCING DUE TO THE COVID-19 PANDEMIC.

## 2019-02-01 ENCOUNTER — Other Ambulatory Visit: Payer: Self-pay | Admitting: Nurse Practitioner

## 2019-02-01 DIAGNOSIS — M25462 Effusion, left knee: Secondary | ICD-10-CM

## 2019-02-01 DIAGNOSIS — M25562 Pain in left knee: Secondary | ICD-10-CM

## 2019-02-16 DIAGNOSIS — M25562 Pain in left knee: Secondary | ICD-10-CM | POA: Diagnosis not present

## 2019-02-17 DIAGNOSIS — M6289 Other specified disorders of muscle: Secondary | ICD-10-CM | POA: Diagnosis not present

## 2019-02-17 DIAGNOSIS — R600 Localized edema: Secondary | ICD-10-CM | POA: Diagnosis not present

## 2019-02-17 DIAGNOSIS — M6281 Muscle weakness (generalized): Secondary | ICD-10-CM | POA: Diagnosis not present

## 2019-02-17 DIAGNOSIS — M25662 Stiffness of left knee, not elsewhere classified: Secondary | ICD-10-CM | POA: Diagnosis not present

## 2019-03-02 DIAGNOSIS — M6281 Muscle weakness (generalized): Secondary | ICD-10-CM | POA: Diagnosis not present

## 2019-03-02 DIAGNOSIS — M6289 Other specified disorders of muscle: Secondary | ICD-10-CM | POA: Diagnosis not present

## 2019-03-02 DIAGNOSIS — M25662 Stiffness of left knee, not elsewhere classified: Secondary | ICD-10-CM | POA: Diagnosis not present

## 2019-03-02 DIAGNOSIS — R600 Localized edema: Secondary | ICD-10-CM | POA: Diagnosis not present

## 2019-03-10 DIAGNOSIS — M6281 Muscle weakness (generalized): Secondary | ICD-10-CM | POA: Diagnosis not present

## 2019-03-10 DIAGNOSIS — M6289 Other specified disorders of muscle: Secondary | ICD-10-CM | POA: Diagnosis not present

## 2019-03-10 DIAGNOSIS — R600 Localized edema: Secondary | ICD-10-CM | POA: Diagnosis not present

## 2019-03-10 DIAGNOSIS — M25662 Stiffness of left knee, not elsewhere classified: Secondary | ICD-10-CM | POA: Diagnosis not present

## 2019-04-15 DIAGNOSIS — N898 Other specified noninflammatory disorders of vagina: Secondary | ICD-10-CM | POA: Diagnosis not present

## 2019-04-15 DIAGNOSIS — B373 Candidiasis of vulva and vagina: Secondary | ICD-10-CM | POA: Diagnosis not present

## 2019-04-17 DIAGNOSIS — Z23 Encounter for immunization: Secondary | ICD-10-CM | POA: Diagnosis not present

## 2019-06-10 DIAGNOSIS — L700 Acne vulgaris: Secondary | ICD-10-CM | POA: Diagnosis not present

## 2019-06-14 ENCOUNTER — Emergency Department (HOSPITAL_COMMUNITY)
Admission: EM | Admit: 2019-06-14 | Discharge: 2019-06-14 | Disposition: A | Discharge status: Home / Self Care | Payer: BC Managed Care – PPO | Attending: Emergency Medicine | Admitting: Emergency Medicine

## 2019-06-14 ENCOUNTER — Encounter (HOSPITAL_COMMUNITY): Payer: Self-pay

## 2019-06-14 ENCOUNTER — Other Ambulatory Visit: Payer: Self-pay

## 2019-06-14 DIAGNOSIS — Z79899 Other long term (current) drug therapy: Secondary | ICD-10-CM | POA: Insufficient documentation

## 2019-06-14 DIAGNOSIS — F10920 Alcohol use, unspecified with intoxication, uncomplicated: Secondary | ICD-10-CM | POA: Insufficient documentation

## 2019-06-14 MED ORDER — LACTATED RINGERS IV BOLUS
1000.0000 mL | Freq: Once | INTRAVENOUS | Status: AC
  Administered 2019-06-14: 1000 mL via INTRAVENOUS

## 2019-06-14 MED ORDER — ONDANSETRON HCL 4 MG/2ML IJ SOLN
4.0000 mg | Freq: Once | INTRAMUSCULAR | Status: AC
  Administered 2019-06-14: 4 mg via INTRAVENOUS
  Filled 2019-06-14: qty 2

## 2019-06-14 MED ORDER — ONDANSETRON HCL 4 MG PO TABS: 4.0000 mg | ORAL_TABLET | Freq: Three times a day (TID) | ORAL | 0 refills | Status: DC | PRN

## 2019-06-14 NOTE — ED Notes (Signed)
Pt's significant other, Jannette Fogo, would like to be notified upon discharge to give a ride. [phone].

## 2019-06-14 NOTE — ED Notes (Signed)
One episode of vomiting since arrival. Emesis consistent with the sangria pt reports drinking. Medication ordered.

## 2019-06-14 NOTE — ED Provider Notes (Signed)
[facility] DEPT Provider Note   CSN: [id number] Arrival date & time: 06/14/19  0343     History   Chief Complaint Chief Complaint  Patient presents with  . Alcohol Intoxication    HPI Yvonne [name] is a 48 y.o. female.     Ultimately patient woke up and states she had way too much to drink.  She was nauseous and disoriented.  States no head injuries.  No recent traumas.  No recent infections or fevers.  No other associated symptoms.   Alcohol Intoxication    Past Medical History:  Diagnosis Date  . Fibroid 2005  . H/O menorrhagia 01/16/11  . Herpes 2009  . HPV in female   . Hx: UTI (urinary tract infection) 05/06/11  . Hyperlipidemia   . PONV (postoperative nausea and vomiting)   . Vulvitis 02/04/11  . Yeast infection 06/19/2010    Patient Active Problem List   Diagnosis Date Noted  . Mixed hyperlipidemia 12/15/2018  . Depression 12/15/2018  . Erroneous encounter - disregard 08/10/2012  . S/P laparoscopic hysterectomy - 06/15/12 06/23/2012  . Diarrhea 06/22/2012  . Abdominal bloating 06/22/2012    Past Surgical History:  Procedure Laterality Date  . CESAREAN SECTION  2008 & 2009  . CRYOTHERAPY  2009  . CYSTOSCOPY  06/15/2012   Procedure: CYSTOSCOPY;  Surgeon: Delice Lesch, MD;  Location: [facility];  Service: Gynecology;  Laterality: N/A;  . LAPAROSCOPIC HYSTERECTOMY  06/15/2012   Procedure: HYSTERECTOMY TOTAL LAPAROSCOPIC;  Surgeon: Delice Lesch, MD;  Location: [facility];  Service: Gynecology;  Laterality: N/A;  . MYOMECTOMY  2005  . TUBAL LIGATION     bilateral     OB History    Gravida  2   Para  2   Term      Preterm      AB      Living  2     SAB      TAB      Ectopic      Multiple      Live Births               Home Medications    Prior to Admission medications   Medication Sig Start Date End Date Taking? Authorizing Provider  buPROPion (WELLBUTRIN SR) 150 MG 12 hr tablet Take 1  tablet (150 mg total) by mouth daily. 01/13/19 01/13/20  Minette Brine, FNP  ipratropium (ATROVENT) 0.03 % nasal spray Place 1 spray into both nostrils daily.  04/17/15   [provider]  ondansetron (ZOFRAN) 4 MG tablet Take 1 tablet (4 mg total) by mouth every 8 (eight) hours as needed for nausea or vomiting. 06/14/19   Daleiza Bacchi, Corene Cornea, MD  valACYclovir (VALTREX) 1000 MG tablet Take 1 tablet (1,000 mg total) by mouth every 12 (twelve) hours. 12/15/18   Minette Brine, FNP  XIIDRA 5 % SOLN INSTILL 1 DROP INTO BOTH EYES TWICE A DAY 12/24/17   [provider]    Family History Family History  Problem Relation Age of Onset  . Hypertension Mother   . Heart disease Mother   . Diabetes Father   . Alcohol abuse Father   . Diabetes Brother   . Hypertension Brother   . Alcohol abuse Brother   . Heart attack Maternal Grandfather   . Hypertension Brother   . Crohn's disease Other        mat 2nd cousin  . Irritable bowel syndrome Other  mat. nephew  . Colon cancer Neg Hx   . Breast cancer Neg Hx     Social History Social History   Tobacco Use  . Smoking status: Never Smoker  . Smokeless tobacco: Never Used  Substance Use Topics  . Alcohol use: Yes    Alcohol/week: 0.0 standard drinks    Comment: daily  . Drug use: No     Allergies   Sulfa antibiotics   Review of Systems Review of Systems  All other systems reviewed and are negative.    Physical Exam Updated Vital Signs BP 122/85 (BP Location: Right Arm)   Pulse 88   Resp 18   LMP 05/25/2012   SpO2 98%   Physical Exam Vitals signs and nursing note reviewed.  Constitutional:      Appearance: She is well-developed.  HENT:     Head: Normocephalic and atraumatic.     Mouth/Throat:     Mouth: Mucous membranes are dry.     Pharynx: Oropharynx is clear.  Eyes:     Conjunctiva/sclera: Conjunctivae normal.     Pupils: Pupils are equal, round, and reactive to light.  Neck:     Musculoskeletal: Normal  range of motion.  Cardiovascular:     Rate and Rhythm: Normal rate and regular rhythm.  Pulmonary:     Effort: No respiratory distress.     Breath sounds: No stridor.  Abdominal:     General: Bowel sounds are normal. There is no distension.  Musculoskeletal: Normal range of motion.        General: No swelling or tenderness.  Skin:    General: Skin is warm and dry.  Neurological:     Mental Status: She is alert.      ED Treatments / Results  Labs (all labs ordered are listed, but only abnormal results are displayed) Labs Reviewed - No data to display  EKG None  Radiology No results found.  Procedures Procedures (including critical care time)  Medications Ordered in ED Medications  lactated ringers bolus 1,000 mL (0 mLs Intravenous Stopped 06/14/19 0516)  ondansetron (ZOFRAN) injection 4 mg (4 mg Intravenous Given 06/14/19 0405)     Initial Impression / Assessment and Plan / ED Course  I have reviewed the triage vital signs and the nursing notes.  Pertinent labs & imaging results that were available during my care of the patient were reviewed by me and considered in my medical decision making (see chart for details).        Initially patient intoxicated not able to offer much history but subsequently when she was more sober she was able to tell me she drank too much.  No concern for meningitis versus trauma or other emergent intracranial abnormalities as a cause for her symptoms.  Patient was ambulated and tolerated p.o. prior to discharge.  Final Clinical Impressions(s) / ED Diagnoses   Final diagnoses:  Alcoholic intoxication without complication [facility])    ED Discharge Orders         Ordered    ondansetron (ZOFRAN) 4 MG tablet  Every 8 hours PRN     06/14/19 0636           Hue Frick, Corene Cornea, MD 06/15/19 [id number]

## 2019-06-14 NOTE — ED Notes (Signed)
Pt requested to use the restroom. She was able to ambulate to the restroom and was given dry scrub pants. Her bed was wet so we changed her linens while she was in the restroom. Her bed and pants are now dry. She was offered a new shirt, but declined. Resting in bed. IV intact.

## 2019-06-14 NOTE — ED Triage Notes (Signed)
Pt arrived via [facility] after boyfriend reports patient drank 3 bottles of Sangria and was vomiting. Pt responsive to voice. Given 500ccs NS en route.

## 2019-06-21 ENCOUNTER — Encounter: Payer: Self-pay | Admitting: Nurse Practitioner

## 2019-06-21 ENCOUNTER — Ambulatory Visit (INDEPENDENT_AMBULATORY_CARE_PROVIDER_SITE_OTHER): Payer: BC Managed Care – PPO | Admitting: Nurse Practitioner

## 2019-06-21 ENCOUNTER — Telehealth: Payer: Self-pay

## 2019-06-21 ENCOUNTER — Other Ambulatory Visit: Payer: Self-pay

## 2019-06-21 VITALS — BP 118/80 | HR 72 | Temp 98.3°F | Ht 64.4 in | Wt 169.8 lb

## 2019-06-21 DIAGNOSIS — G4700 Insomnia, unspecified: Secondary | ICD-10-CM

## 2019-06-21 DIAGNOSIS — F32A Depression, unspecified: Secondary | ICD-10-CM

## 2019-06-21 DIAGNOSIS — K5900 Constipation, unspecified: Secondary | ICD-10-CM | POA: Diagnosis not present

## 2019-06-21 DIAGNOSIS — Z1211 Encounter for screening for malignant neoplasm of colon: Secondary | ICD-10-CM | POA: Diagnosis not present

## 2019-06-21 DIAGNOSIS — E785 Hyperlipidemia, unspecified: Secondary | ICD-10-CM | POA: Diagnosis not present

## 2019-06-21 DIAGNOSIS — F329 Major depressive disorder, single episode, unspecified: Secondary | ICD-10-CM

## 2019-06-21 DIAGNOSIS — Z0000 Encounter for general adult medical examination without abnormal findings: Secondary | ICD-10-CM

## 2019-06-21 LAB — POCT URINALYSIS DIPSTICK
Bilirubin, UA: NEGATIVE
Blood, UA: NEGATIVE
Glucose, UA: NEGATIVE
Ketones, UA: NEGATIVE
Leukocytes, UA: NEGATIVE
Nitrite, UA: NEGATIVE
Protein, UA: NEGATIVE
Spec Grav, UA: 1.025 (ref 1.010–1.025)
Urobilinogen, UA: 0.2 E.U./dL
pH, UA: 6 (ref 5.0–8.0)

## 2019-06-21 MED ORDER — LINACLOTIDE 72 MCG PO CAPS: 72.0000 ug | ORAL_CAPSULE | Freq: Every day | ORAL | 1 refills | Status: DC

## 2019-06-21 NOTE — Progress Notes (Signed)
Subjective:     Patient ID: Yvonne [name] , female    DOB: [birth year] , 48 y.o.   MRN: [id number]   Here for HM  Wt Readings from Last 3 Encounters: 06/21/19 : 169 lb 12.8 oz (77 kg) 01/28/19 : 177 lb 9.6 oz (80.6 kg) 01/13/19 : 179 lb (81.2 kg)   Insomnia Primary symptoms: fragmented sleep, sleep disturbance, frequent awakening.  The current episode started more than one month. The onset quality is sudden. The problem occurs intermittently. PMH includes: associated symptoms present.    The patient states she uses status post hysterectomy for birth control.   Negative for: breast discharge, breast lump(s), breast pain and breast self exam. Associated symptoms include abnormal vaginal bleeding. Pertinent negatives include abnormal bleeding (hematology), anxiety, decreased libido, depression, difficulty falling sleep, dyspareunia, history of infertility, nocturia, sexual dysfunction, sleep disturbances, urinary incontinence, urinary urgency, vaginal discharge and vaginal itching. Diet regular. The patient states her exercise level is      The patient's tobacco use is:  Social History   Tobacco Use  Smoking Status Never Smoker  Smokeless Tobacco Never Used   She has been exposed to passive smoke. The patient's alcohol use is:  Social History   Substance and Sexual Activity  Alcohol Use Yes  . Alcohol/week: 0.0 standard drinks   Comment: daily   Additional information: Last pap 2019, next one scheduled for 2021  Past Medical History:  Diagnosis Date  . Fibroid 2005  . H/O menorrhagia 01/16/11  . Herpes 2009  . HPV in female   . Hx: UTI (urinary tract infection) 05/06/11  . Hyperlipidemia   . PONV (postoperative nausea and vomiting)   . Vulvitis 02/04/11  . Yeast infection 06/19/2010      Current Outpatient Medications:  .  buPROPion (WELLBUTRIN SR) 150 MG 12 hr tablet, Take 1 tablet (150 mg total) by mouth daily., Disp: 90 tablet, Rfl: 1 .  ipratropium (ATROVENT) 0.03 %  nasal spray, Place 1 spray into both nostrils daily. , Disp: , Rfl: 2 .  valACYclovir (VALTREX) 1000 MG tablet, Take 1 tablet (1,000 mg total) by mouth every 12 (twelve) hours., Disp: 60 tablet, Rfl: 0 .  ondansetron (ZOFRAN) 4 MG tablet, Take 1 tablet (4 mg total) by mouth every 8 (eight) hours as needed for nausea or vomiting. (Patient not taking: Reported on 06/21/2019), Disp: 12 tablet, Rfl: 0 .  XIIDRA 5 % SOLN, INSTILL 1 DROP INTO BOTH EYES TWICE A DAY, Disp: , Rfl: 0   Allergies  Allergen Reactions  . Sulfa Antibiotics Itching     Review of Systems  Constitutional: Negative.  Negative for chills.  HENT: Negative.   Eyes: Negative.   Respiratory: Negative.   Cardiovascular: Negative.   Gastrointestinal: Positive for constipation.  Endocrine: Negative.   Genitourinary: Negative.   Musculoskeletal: Negative.   Skin: Negative.   Allergic/Immunologic: Negative.   Neurological: Negative.   Hematological: Negative.   Psychiatric/Behavioral: Positive for sleep disturbance. The patient has insomnia.      Today's Vitals   06/21/19 1018  BP: 118/80  Pulse: 72  Temp: 98.3 F (36.8 C)  TempSrc: Oral  Weight: 169 lb 12.8 oz (77 kg)  Height: 5' 4.4" (1.636 m)  PainSc: 0-No pain   Body mass index is 28.79 kg/m.   Objective:  Physical Exam Vitals signs reviewed.  Constitutional:      Appearance: Normal appearance. She is well-developed.  HENT:     Head: Normocephalic and atraumatic.  Right Ear: Hearing, tympanic membrane, ear canal and external ear normal. There is no impacted cerumen.     Left Ear: Hearing, tympanic membrane, ear canal and external ear normal. There is no impacted cerumen.     Nose: Nose normal.     Mouth/Throat:     Mouth: Mucous membranes are moist.  Eyes:     General: Lids are normal.     Extraocular Movements: Extraocular movements intact.     Conjunctiva/sclera: Conjunctivae normal.     Pupils: Pupils are equal, round, and reactive to light.      Funduscopic exam:    Right eye: No papilledema.        Left eye: No papilledema.  Neck:     Musculoskeletal: Full passive range of motion without pain, normal range of motion and neck supple.     Thyroid: No thyroid mass.     Vascular: No carotid bruit.  Cardiovascular:     Rate and Rhythm: Normal rate and regular rhythm.     Pulses: Normal pulses.     Heart sounds: Normal heart sounds. No murmur.  Pulmonary:     Effort: Pulmonary effort is normal.     Breath sounds: Normal breath sounds.  Abdominal:     General: Abdomen is flat. Bowel sounds are normal. There is no distension.     Palpations: Abdomen is soft.     Tenderness: There is no abdominal tenderness.  Musculoskeletal: Normal range of motion.        General: No swelling or tenderness.     Right lower leg: No edema.     Left lower leg: No edema.  Skin:    General: Skin is warm and dry.     Capillary Refill: Capillary refill takes less than 2 seconds.  Neurological:     General: No focal deficit present.     Mental Status: She is alert and oriented to person, place, and time.     Cranial Nerves: No cranial nerve deficit.     Sensory: No sensory deficit.  Psychiatric:        Mood and Affect: Mood normal.        Behavior: Behavior normal.        Thought Content: Thought content normal.        Judgment: Judgment normal.         Assessment And Plan:     1. Encounter for general adult medical examination w/o abnormal findings . Behavior modifications discussed and diet history reviewed.   . Pt will continue to exercise regularly and modify diet with low GI, plant based foods and decrease intake of processed foods.  . Recommend intake of daily multivitamin, Vitamin D, and calcium.  . Recommend mammogram and colonoscopy for preventive screenings, as well as recommend immunizations that include influenza (declined) and TDAP (UTD) - POCT Urinalysis Dipstick (81002) - VITAMIN D 25 Hydroxy (Vit-D Deficiency,  Fractures)  2. Encounter for screening colonoscopy  According to USPTF Colorectal cancer Screening guidelines. Colonoscopy is recommended every 10 years, starting at age 69years.  Will refer to GI for colon cancer screening. - Ambulatory referral to Gastroenterology  3. Hyperlipidemia, unspecified hyperlipidemia type  Chronic, she does not tolerate statins well  Will check levels pending results will consider a different cholesterol agent  She did not like the way statins made her feel. Made her feel flushed - CMP14+EGFR - Hemoglobin A1c - Lipid panel  4. Constipation, unspecified constipation type  She has tried miralax daily without  relief  Will try her on linzess samples given pending approval from her insurance   5. Depression, unspecified depression type  Chronic, stable  Continue with current medications  6. Insomnia, unspecified type  Continue with melatonin which has been helpful     Minette Brine, FNP

## 2019-06-21 NOTE — Telephone Encounter (Signed)
PA for linzess has been submitted through covermymeds we are just waiting for the determination from her insurance company. YRL,RMA

## 2019-06-21 NOTE — Patient Instructions (Addendum)
Health Maintenance, Female Adopting a healthy lifestyle and getting preventive care are important in promoting health and wellness. Ask your health care provider about:  The right schedule for you to have regular tests and exams.  Things you can do on your own to prevent diseases and keep yourself healthy. What should I know about diet, weight, and exercise? Eat a healthy diet   Eat a diet that includes plenty of vegetables, fruits, low-fat dairy products, and lean protein.  Do not eat a lot of foods that are high in solid fats, added sugars, or sodium. Maintain a healthy weight Body mass index (BMI) is used to identify weight problems. It estimates body fat based on height and weight. Your health care provider can help determine your BMI and help you achieve or maintain a healthy weight. Get regular exercise Get regular exercise. This is one of the most important things you can do for your health. Most adults should:  Exercise for at least 150 minutes each week. The exercise should increase your heart rate and make you sweat (moderate-intensity exercise).  Do strengthening exercises at least twice a week. This is in addition to the moderate-intensity exercise.  Spend less time sitting. Even light physical activity can be beneficial. Watch cholesterol and blood lipids Have your blood tested for lipids and cholesterol at 50 years of age, then have this test every 5 years. Have your cholesterol levels checked more often if:  Your lipid or cholesterol levels are high.  You are older than 50 years of age.  You are at high risk for heart disease. What should I know about cancer screening? Depending on your health history and family history, you may need to have cancer screening at various ages. This may include screening for:  Breast cancer.  Cervical cancer.  Colorectal cancer.  Skin cancer.  Lung cancer. What should I know about heart disease, diabetes, and high blood  pressure? Blood pressure and heart disease  High blood pressure causes heart disease and increases the risk of stroke. This is more likely to develop in people who have high blood pressure readings, are of African descent, or are overweight.  Have your blood pressure checked: ? Every 3-5 years if you are 18-39 years of age. ? Every year if you are 40 years old or older. Diabetes Have regular diabetes screenings. This checks your fasting blood sugar level. Have the screening done:  Once every three years after age 40 if you are at a normal weight and have a low risk for diabetes.  More often and at a younger age if you are overweight or have a high risk for diabetes. What should I know about preventing infection? Hepatitis B If you have a higher risk for hepatitis B, you should be screened for this virus. Talk with your health care provider to find out if you are at risk for hepatitis B infection. Hepatitis C Testing is recommended for:  Everyone born from 1945 through 1965.  Anyone with known risk factors for hepatitis C. Sexually transmitted infections (STIs)  Get screened for STIs, including gonorrhea and chlamydia, if: ? You are sexually active and are younger than 50 years of age. ? You are older than 50 years of age and your health care provider tells you that you are at risk for this type of infection. ? Your sexual activity has changed since you were last screened, and you are at increased risk for chlamydia or gonorrhea. Ask your health care provider if   you are at risk.  Ask your health care provider about whether you are at high risk for HIV. Your health care provider may recommend a prescription medicine to help prevent HIV infection. If you choose to take medicine to prevent HIV, you should first get tested for HIV. You should then be tested every 3 months for as long as you are taking the medicine. Pregnancy  If you are about to stop having your period (premenopausal) and  you may become pregnant, seek counseling before you get pregnant.  Take 400 to 800 micrograms (mcg) of folic acid every day if you become pregnant.  Ask for birth control (contraception) if you want to prevent pregnancy. Osteoporosis and menopause Osteoporosis is a disease in which the bones lose minerals and strength with aging. This can result in bone fractures. If you are 45 years old or older, or if you are at risk for osteoporosis and fractures, ask your health care provider if you should:  Be screened for bone loss.  Take a calcium or vitamin D supplement to lower your risk of fractures.  Be given hormone replacement therapy (HRT) to treat symptoms of menopause. Follow these instructions at home: Lifestyle  Do not use any products that contain nicotine or tobacco, such as cigarettes, e-cigarettes, and chewing tobacco. If you need help quitting, ask your health care provider.  Do not use street drugs.  Do not share needles.  Ask your health care provider for help if you need support or information about quitting drugs. Alcohol use  Do not drink alcohol if: ? Your health care provider tells you not to drink. ? You are pregnant, may be pregnant, or are planning to become pregnant.  If you drink alcohol: ? Limit how much you use to 0-1 drink a day. ? Limit intake if you are breastfeeding.  Be aware of how much alcohol is in your drink. In the [place] one drink equals one 12 oz bottle of beer (355 mL), one 5 oz glass of wine (148 mL), or one 1 oz glass of hard liquor (44 mL). General instructions  Schedule regular health, dental, and eye exams.  Stay current with your vaccines.  Tell your health care provider if: ? You often feel depressed. ? You have ever been abused or do not feel safe at home. Summary  Adopting a healthy lifestyle and getting preventive care are important in promoting health and wellness.  Follow your health care provider's instructions about healthy  diet, exercising, and getting tested or screened for diseases.  Follow your health care provider's instructions on monitoring your cholesterol and blood pressure. This information is not intended to replace advice given to you by your health care provider. Make sure you discuss any questions you have with your health care provider. Document Released: 02/25/2011 Document Revised: 08/05/2018 Document Reviewed: 08/05/2018 Elsevier Patient Education  2020 [employer] Maintenance  Topic Date Due  . PAP SMEAR-Modifier  12/18/2014  . COLONOSCOPY  12/28/2018  . MAMMOGRAM  10/09/2019  . TETANUS/TDAP  06/18/2028  . INFLUENZA VACCINE  Completed  . HIV Screening  Completed    Health Maintenance, Female Adopting a healthy lifestyle and getting preventive care are important in promoting health and wellness. Ask your health care provider about:  The right schedule for you to have regular tests and exams.  Things you can do on your own to prevent diseases and keep yourself healthy. What should I know about diet, weight, and exercise? Eat a  healthy diet   Eat a diet that includes plenty of vegetables, fruits, low-fat dairy products, and lean protein.  Do not eat a lot of foods that are high in solid fats, added sugars, or sodium. Maintain a healthy weight Body mass index (BMI) is used to identify weight problems. It estimates body fat based on height and weight. Your health care provider can help determine your BMI and help you achieve or maintain a healthy weight. Get regular exercise Get regular exercise. This is one of the most important things you can do for your health. Most adults should:  Exercise for at least 150 minutes each week. The exercise should increase your heart rate and make you sweat (moderate-intensity exercise).  Do strengthening exercises at least twice a week. This is in addition to the moderate-intensity exercise.  Spend less time sitting. Even light  physical activity can be beneficial. Watch cholesterol and blood lipids Have your blood tested for lipids and cholesterol at 50 years of age, then have this test every 5 years. Have your cholesterol levels checked more often if:  Your lipid or cholesterol levels are high.  You are older than 50 years of age.  You are at high risk for heart disease. What should I know about cancer screening? Depending on your health history and family history, you may need to have cancer screening at various ages. This may include screening for:  Breast cancer.  Cervical cancer.  Colorectal cancer.  Skin cancer.  Lung cancer. What should I know about heart disease, diabetes, and high blood pressure? Blood pressure and heart disease  High blood pressure causes heart disease and increases the risk of stroke. This is more likely to develop in people who have high blood pressure readings, are of African descent, or are overweight.  Have your blood pressure checked: ? Every 3-5 years if you are 35-44 years of age. ? Every year if you are 106 years old or older. Diabetes Have regular diabetes screenings. This checks your fasting blood sugar level. Have the screening done:  Once every three years after age 62 if you are at a normal weight and have a low risk for diabetes.  More often and at a younger age if you are overweight or have a high risk for diabetes. What should I know about preventing infection? Hepatitis B If you have a higher risk for hepatitis B, you should be screened for this virus. Talk with your health care provider to find out if you are at risk for hepatitis B infection. Hepatitis C Testing is recommended for:  Everyone born from [birth year] through 1965.  Anyone with known risk factors for hepatitis C. Sexually transmitted infections (STIs)  Get screened for STIs, including gonorrhea and chlamydia, if: ? You are sexually active and are younger than 50 years of age. ? You are older  than 50 years of age and your health care provider tells you that you are at risk for this type of infection. ? Your sexual activity has changed since you were last screened, and you are at increased risk for chlamydia or gonorrhea. Ask your health care provider if you are at risk.  Ask your health care provider about whether you are at high risk for HIV. Your health care provider may recommend a prescription medicine to help prevent HIV infection. If you choose to take medicine to prevent HIV, you should first get tested for HIV. You should then be tested every 3 months for as long as  you are taking the medicine. Pregnancy  If you are about to stop having your period (premenopausal) and you may become pregnant, seek counseling before you get pregnant.  Take 400 to 800 micrograms (mcg) of folic acid every day if you become pregnant.  Ask for birth control (contraception) if you want to prevent pregnancy. Osteoporosis and menopause Osteoporosis is a disease in which the bones lose minerals and strength with aging. This can result in bone fractures. If you are 36 years old or older, or if you are at risk for osteoporosis and fractures, ask your health care provider if you should:  Be screened for bone loss.  Take a calcium or vitamin D supplement to lower your risk of fractures.  Be given hormone replacement therapy (HRT) to treat symptoms of menopause. Follow these instructions at home: Lifestyle  Do not use any products that contain nicotine or tobacco, such as cigarettes, e-cigarettes, and chewing tobacco. If you need help quitting, ask your health care provider.  Do not use street drugs.  Do not share needles.  Ask your health care provider for help if you need support or information about quitting drugs. Alcohol use  Do not drink alcohol if: ? Your health care provider tells you not to drink. ? You are pregnant, may be pregnant, or are planning to become pregnant.  If you drink  alcohol: ? Limit how much you use to 0-1 drink a day. ? Limit intake if you are breastfeeding.  Be aware of how much alcohol is in your drink. In the [place] one drink equals one 12 oz bottle of beer (355 mL), one 5 oz glass of wine (148 mL), or one 1 oz glass of hard liquor (44 mL). General instructions  Schedule regular health, dental, and eye exams.  Stay current with your vaccines.  Tell your health care provider if: ? You often feel depressed. ? You have ever been abused or do not feel safe at home. Summary  Adopting a healthy lifestyle and getting preventive care are important in promoting health and wellness.  Follow your health care provider's instructions about healthy diet, exercising, and getting tested or screened for diseases.  Follow your health care provider's instructions on monitoring your cholesterol and blood pressure. This information is not intended to replace advice given to you by your health care provider. Make sure you discuss any questions you have with your health care provider. Document Released: 02/25/2011 Document Revised: 08/05/2018 Document Reviewed: 08/05/2018 Elsevier Patient Education  2020 [employer].

## 2019-06-22 LAB — CMP14+EGFR
ALT: 20 IU/L (ref 0–32)
AST: 19 IU/L (ref 0–40)
Albumin/Globulin Ratio: 2 (ref 1.2–2.2)
Albumin: 4.7 g/dL (ref 3.8–4.8)
Alkaline Phosphatase: 72 IU/L (ref 39–117)
BUN/Creatinine Ratio: 12 (ref 9–23)
BUN: 9 mg/dL (ref 6–24)
Bilirubin Total: 0.3 mg/dL (ref 0.0–1.2)
CO2: 23 mmol/L (ref 20–29)
Calcium: 9.8 mg/dL (ref 8.7–10.2)
Chloride: 104 mmol/L (ref 96–106)
Creatinine, Ser: 0.77 mg/dL (ref 0.57–1.00)
GFR calc Af Amer: 104 mL/min/{1.73_m2} (ref >59)
GFR calc non Af Amer: 90 mL/min/{1.73_m2} (ref >59)
Globulin, Total: 2.4 g/dL (ref 1.5–4.5)
Glucose: 115 mg/dL — ABNORMAL HIGH (ref 65–99)
Potassium: 4 mmol/L (ref 3.5–5.2)
Sodium: 143 mmol/L (ref 134–144)
Total Protein: 7.1 g/dL (ref 6.0–8.5)

## 2019-06-22 LAB — LIPID PANEL
Chol/HDL Ratio: 3.5 ratio (ref 0.0–4.4)
Cholesterol, Total: 290 mg/dL — ABNORMAL HIGH (ref 100–199)
HDL: 83 mg/dL (ref >39)
LDL Chol Calc (NIH): 195 mg/dL — ABNORMAL HIGH (ref 0–99)
Triglycerides: 78 mg/dL (ref 0–149)
VLDL Cholesterol Cal: 12 mg/dL (ref 5–40)

## 2019-06-22 LAB — VITAMIN D 25 HYDROXY (VIT D DEFICIENCY, FRACTURES): Vit D, 25-Hydroxy: 24.5 ng/mL — ABNORMAL LOW (ref 30.0–100.0)

## 2019-06-22 LAB — HEMOGLOBIN A1C
Est. average glucose Bld gHb Est-mCnc: 123 mg/dL
Hgb A1c MFr Bld: 5.9 % — ABNORMAL HIGH (ref 4.8–5.6)

## 2019-06-24 ENCOUNTER — Other Ambulatory Visit: Payer: Self-pay

## 2019-06-24 ENCOUNTER — Other Ambulatory Visit: Payer: Self-pay | Admitting: Nurse Practitioner

## 2019-06-24 ENCOUNTER — Encounter: Payer: Self-pay | Admitting: Nurse Practitioner

## 2019-06-24 DIAGNOSIS — K5900 Constipation, unspecified: Secondary | ICD-10-CM

## 2019-06-24 MED ORDER — LUBIPROSTONE 8 MCG PO CAPS: 8.0000 ug | ORAL_CAPSULE | Freq: Two times a day (BID) | ORAL | 1 refills | Status: DC

## 2019-06-24 NOTE — Progress Notes (Signed)
Nexletol is the medication for her cholesterol, this is for people who can not tolerate statins.

## 2019-06-25 IMAGING — MG DIGITAL SCREENING BILATERAL MAMMOGRAM WITH CAD
4 series · 4 of 4 positions shown · non-contrast
Comparison: Previous exam(s).

CLINICAL DATA: Screening.

EXAM:
DIGITAL SCREENING BILATERAL MAMMOGRAM WITH CAD

[R CC]
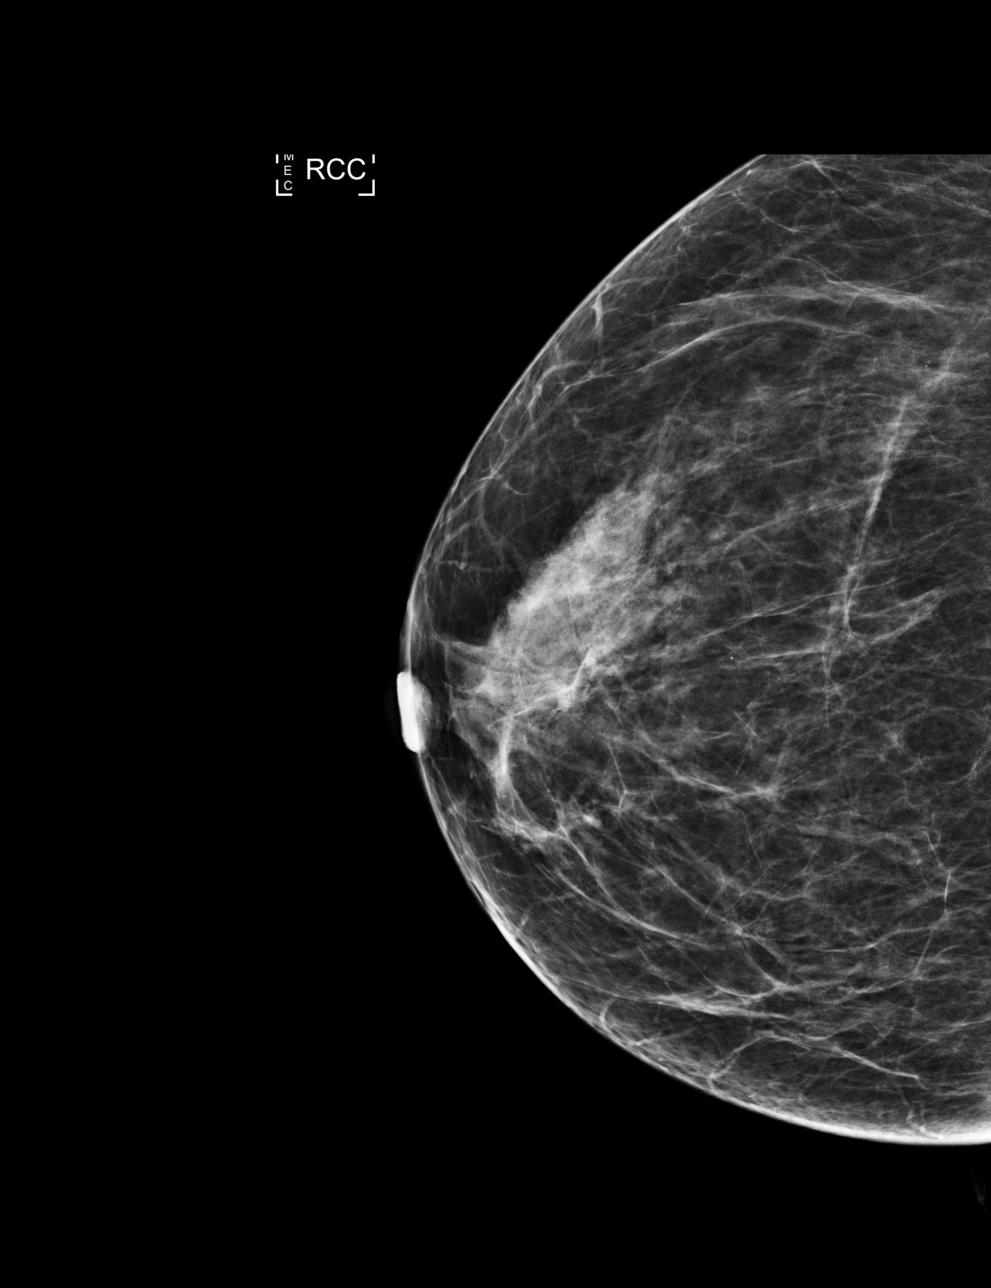

[R MLO]
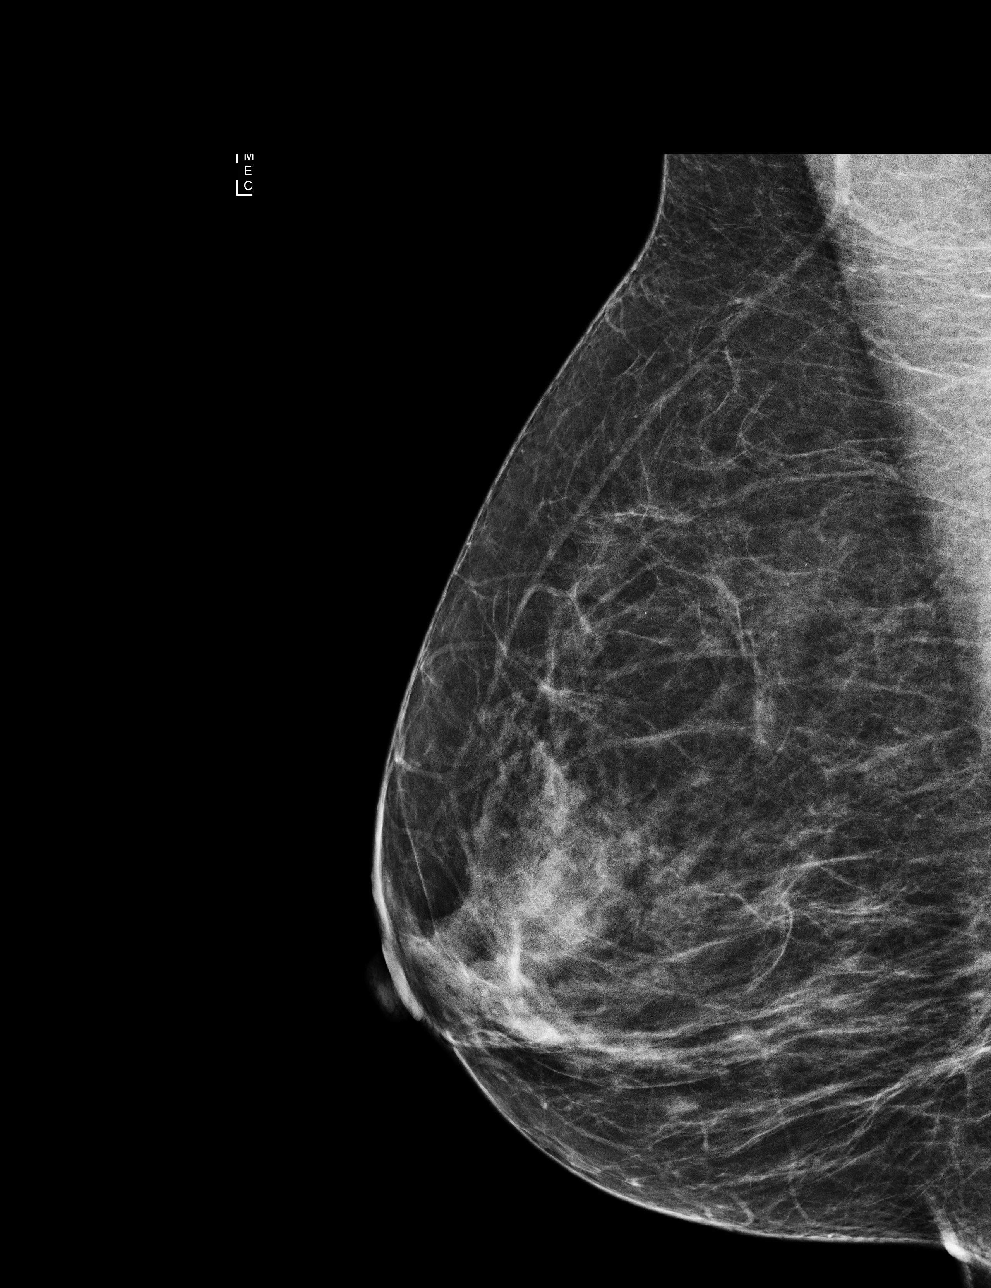

[L CC]
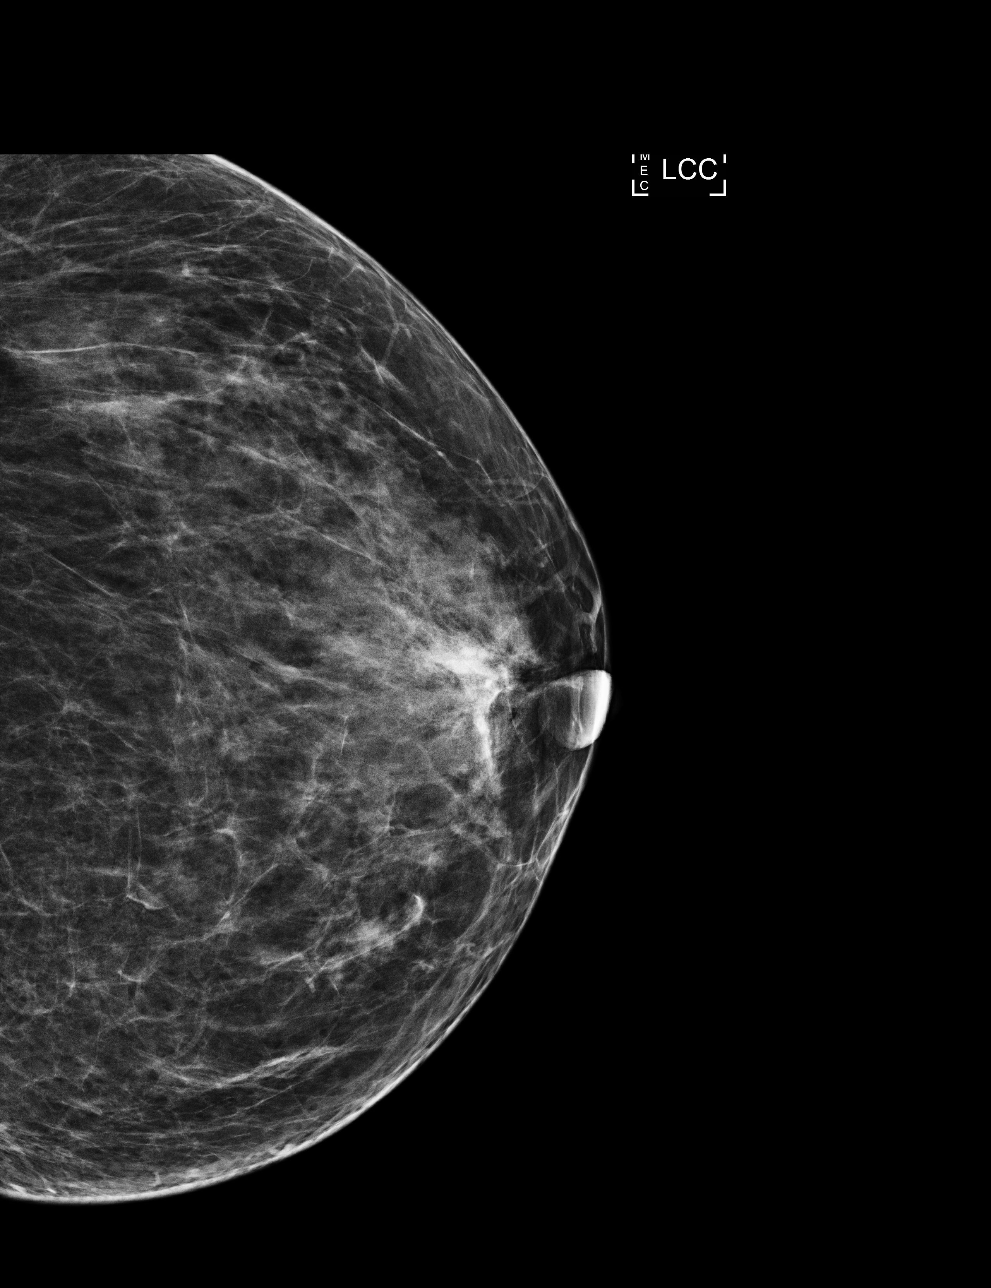

[L MLO]
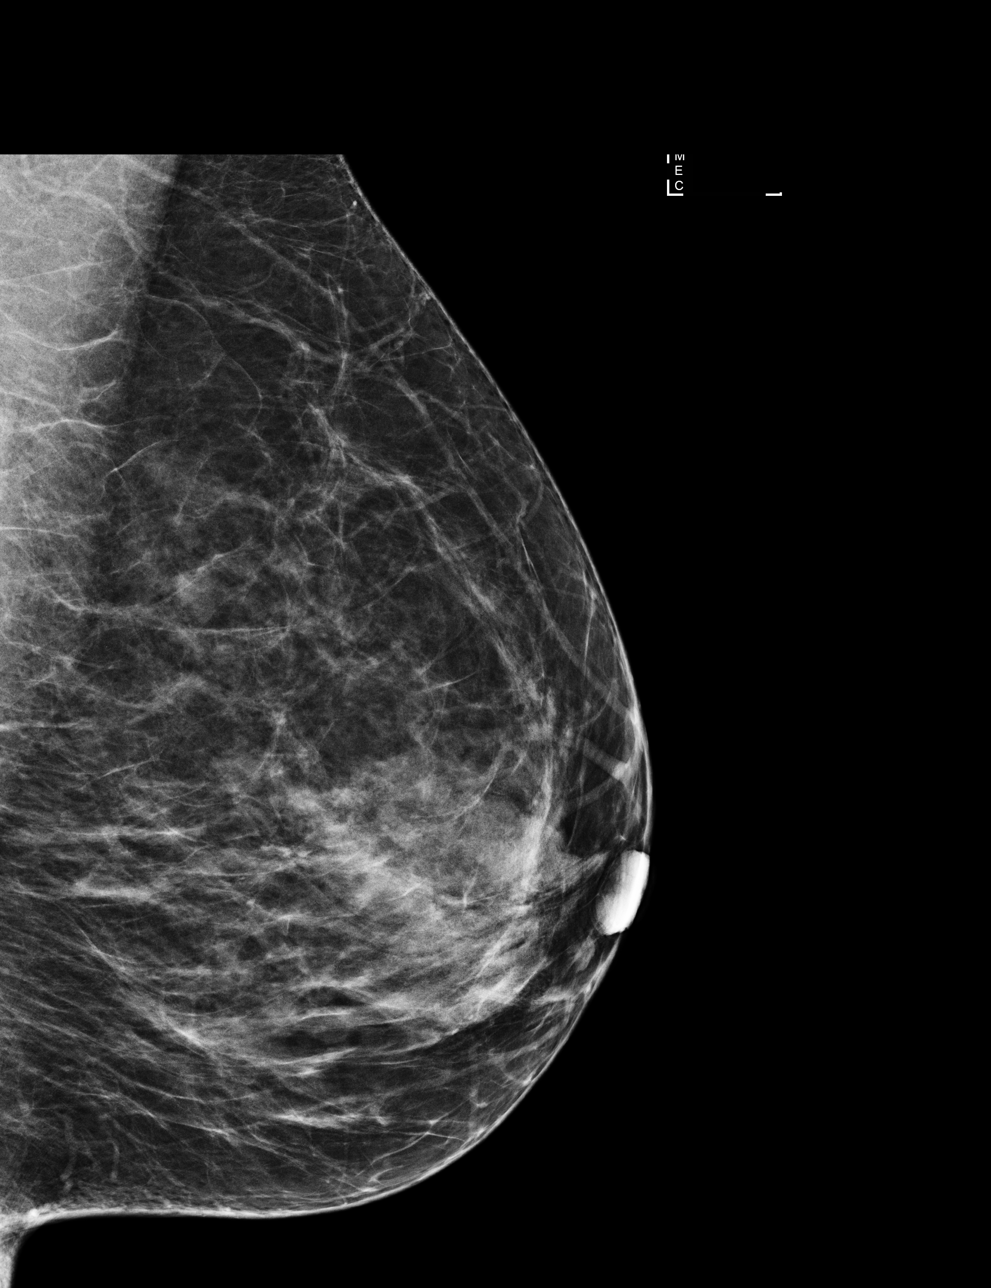

[4 of 4 positions shown; findings below may reference images not displayed]

ACR Breast Density Category c: The breast tissue is heterogeneously
dense, which may obscure small masses.
FINDINGS: There are no findings suspicious for malignancy. Images were
processed with CAD.
IMPRESSION: No mammographic evidence of malignancy. A result letter of this
screening mammogram will be mailed directly to the patient.

RECOMMENDATION:
Screening mammogram in one year. (Code:[id number])

BI-RADS CATEGORY  1: Negative.

## 2019-06-28 ENCOUNTER — Encounter: Payer: Self-pay | Admitting: Nurse Practitioner

## 2019-07-03 ENCOUNTER — Encounter: Payer: Self-pay | Admitting: Nurse Practitioner

## 2019-07-09 ENCOUNTER — Encounter: Payer: Self-pay | Admitting: Nurse Practitioner

## 2019-07-12 ENCOUNTER — Other Ambulatory Visit: Payer: Self-pay | Admitting: Nurse Practitioner

## 2019-07-12 DIAGNOSIS — E785 Hyperlipidemia, unspecified: Secondary | ICD-10-CM

## 2019-07-12 MED ORDER — NEXLETOL 180 MG PO TABS: 1.0000 | ORAL_TABLET | Freq: Every day | ORAL | 3 refills | Status: DC

## 2019-07-19 ENCOUNTER — Other Ambulatory Visit: Payer: Self-pay

## 2019-07-19 DIAGNOSIS — K5900 Constipation, unspecified: Secondary | ICD-10-CM

## 2019-07-19 DIAGNOSIS — E785 Hyperlipidemia, unspecified: Secondary | ICD-10-CM

## 2019-07-19 MED ORDER — PAROXETINE HCL 10 MG PO TABS: 10.0000 mg | ORAL_TABLET | Freq: Every day | ORAL | 2 refills | Status: DC

## 2019-07-19 MED ORDER — LUBIPROSTONE 8 MCG PO CAPS: 8.0000 ug | ORAL_CAPSULE | Freq: Two times a day (BID) | ORAL | 1 refills | Status: DC

## 2019-07-19 MED ORDER — NEXLETOL 180 MG PO TABS: 1.0000 | ORAL_TABLET | Freq: Every day | ORAL | 3 refills | Status: DC

## 2019-07-20 ENCOUNTER — Encounter: Payer: Self-pay | Admitting: Nurse Practitioner

## 2019-08-12 ENCOUNTER — Other Ambulatory Visit: Payer: Self-pay | Admitting: Nurse Practitioner

## 2019-08-12 ENCOUNTER — Encounter: Payer: Self-pay | Admitting: Nurse Practitioner

## 2019-08-12 ENCOUNTER — Telehealth: Payer: Self-pay

## 2019-08-12 DIAGNOSIS — F329 Major depressive disorder, single episode, unspecified: Secondary | ICD-10-CM

## 2019-08-12 DIAGNOSIS — F32A Depression, unspecified: Secondary | ICD-10-CM

## 2019-08-12 NOTE — Telephone Encounter (Signed)
Patient called and was provided with the number for [facility]. YRL,RMA

## 2019-08-12 NOTE — Telephone Encounter (Signed)
-----   Message from Minette Brine, [place] sent at 08/12/2019  2:24 PM EST ----- Regarding: GI Referral Call patient and provide phone number to GI for her to schedule her colonoscopy this is extremely important especially with the history of constipation  ----- Message ----- From: Hoy Register Sent: 08/12/2019   9:21 AM EST To: Minette Brine, FNP

## 2019-09-16 ENCOUNTER — Encounter: Payer: Self-pay | Admitting: Nurse Practitioner

## 2019-10-12 ENCOUNTER — Encounter: Payer: Self-pay | Admitting: Gastroenterology

## 2019-10-12 ENCOUNTER — Encounter: Payer: Self-pay | Admitting: Nurse Practitioner

## 2019-10-12 ENCOUNTER — Other Ambulatory Visit: Payer: Self-pay

## 2019-10-12 DIAGNOSIS — Z1211 Encounter for screening for malignant neoplasm of colon: Secondary | ICD-10-CM

## 2019-10-14 ENCOUNTER — Other Ambulatory Visit: Payer: Self-pay | Admitting: Nurse Practitioner

## 2019-10-20 ENCOUNTER — Other Ambulatory Visit: Payer: Self-pay | Admitting: Nurse Practitioner

## 2019-10-20 DIAGNOSIS — Z1231 Encounter for screening mammogram for malignant neoplasm of breast: Secondary | ICD-10-CM

## 2019-10-21 ENCOUNTER — Other Ambulatory Visit: Payer: Self-pay

## 2019-10-21 ENCOUNTER — Ambulatory Visit
Admission: RE | Admit: 2019-10-21 | Discharge: 2019-10-21 | Disposition: A | Discharge status: Home / Self Care | Payer: 59 | Source: Ambulatory Visit

## 2019-10-21 DIAGNOSIS — Z1231 Encounter for screening mammogram for malignant neoplasm of breast: Secondary | ICD-10-CM

## 2019-11-10 ENCOUNTER — Other Ambulatory Visit: Payer: Self-pay

## 2019-11-10 ENCOUNTER — Ambulatory Visit (AMBULATORY_SURGERY_CENTER): Payer: Self-pay | Admitting: *Deleted

## 2019-11-10 VITALS — Temp 97.7°F | Ht 64.0 in | Wt 178.0 lb

## 2019-11-10 DIAGNOSIS — Z1211 Encounter for screening for malignant neoplasm of colon: Secondary | ICD-10-CM

## 2019-11-10 MED ORDER — NA SULFATE-K SULFATE-MG SULF 17.5-3.13-1.6 GM/177ML PO SOLN: ORAL | 0 refills | Status: DC

## 2019-11-10 NOTE — Progress Notes (Signed)
Patient is here in-person for PV. Patient denies any allergies to eggs or soy. Patient denies any problems with anesthesia/sedation. Patient denies any oxygen use at home. Patient denies taking any diet/weight loss medications or blood thinners. Patient is not being treated for MRSA or C-diff. EMMI education assisgned to the patient for the procedure, this was explained and instructions given to patient. COVID-19 screening test is not neededd,  Both covid vaccines completed per pt, 2nd dose on 09/29/19.Marland Kitchen  Patient is aware of our care-partner policy and 0000000 safety protocol.   2day prep given to pt Miralax/Suprep due to constipation per pr.

## 2019-11-12 ENCOUNTER — Other Ambulatory Visit: Payer: Self-pay | Admitting: Nurse Practitioner

## 2019-11-24 ENCOUNTER — Other Ambulatory Visit: Payer: Self-pay

## 2019-11-24 ENCOUNTER — Encounter: Payer: Self-pay | Admitting: Gastroenterology

## 2019-11-24 ENCOUNTER — Ambulatory Visit (AMBULATORY_SURGERY_CENTER): Payer: 59 | Admitting: Gastroenterology

## 2019-11-24 VITALS — BP 130/69 | HR 64 | Temp 97.1°F | Resp 11 | Ht 64.0 in | Wt 178.0 lb

## 2019-11-24 DIAGNOSIS — K635 Polyp of colon: Secondary | ICD-10-CM | POA: Diagnosis not present

## 2019-11-24 DIAGNOSIS — Z1211 Encounter for screening for malignant neoplasm of colon: Secondary | ICD-10-CM | POA: Diagnosis present

## 2019-11-24 DIAGNOSIS — D124 Benign neoplasm of descending colon: Secondary | ICD-10-CM

## 2019-11-24 HISTORY — PX: COLONOSCOPY: SHX174

## 2019-11-24 MED ORDER — SODIUM CHLORIDE 0.9 % IV SOLN: 500.0000 mL | Freq: Once | INTRAVENOUS | Status: DC

## 2019-11-24 NOTE — Progress Notes (Signed)
Called to room to assist during endoscopic procedure.  Patient ID and intended procedure confirmed with present staff. Received instructions for my participation in the procedure from the performing physician.  

## 2019-11-24 NOTE — Patient Instructions (Signed)
Impression/Recommendations:  Polyp handout given to patient.  Resume previous diet. Continue present medications. Await pathology results.  Repeat colonoscopy recommended after pathology results are reviewed.  YOU HAD AN ENDOSCOPIC PROCEDURE TODAY AT [facility]:   Refer to the procedure report that was given to you for any specific questions about what was found during the examination.  If the procedure report does not answer your questions, please call your gastroenterologist to clarify.  If you requested that your care partner not be given the details of your procedure findings, then the procedure report has been included in a sealed envelope for you to review at your convenience later.  YOU SHOULD EXPECT: Some feelings of bloating in the abdomen. Passage of more gas than usual.  Walking can help get rid of the air that was put into your GI tract during the procedure and reduce the bloating. If you had a lower endoscopy (such as a colonoscopy or flexible sigmoidoscopy) you may notice spotting of blood in your stool or on the toilet paper. If you underwent a bowel prep for your procedure, you may not have a normal bowel movement for a few days.  Please Note:  You might notice some irritation and congestion in your nose or some drainage.  This is from the oxygen used during your procedure.  There is no need for concern and it should clear up in a day or so.  SYMPTOMS TO REPORT IMMEDIATELY:   Following lower endoscopy (colonoscopy or flexible sigmoidoscopy):  Excessive amounts of blood in the stool  Significant tenderness or worsening of abdominal pains  Swelling of the abdomen that is new, acute  Fever of 100F or higher For urgent or emergent issues, a gastroenterologist can be reached at any hour by calling [phone]. Do not use MyChart messaging for urgent concerns.    DIET:  We do recommend a small meal at first, but then you may proceed to your regular diet.   Drink plenty of fluids but you should avoid alcoholic beverages for 24 hours.  ACTIVITY:  You should plan to take it easy for the rest of today and you should NOT DRIVE or use heavy machinery until tomorrow (because of the sedation medicines used during the test).    FOLLOW UP: Our staff will call the number listed on your records 48-72 hours following your procedure to check on you and address any questions or concerns that you may have regarding the information given to you following your procedure. If we do not reach you, we will leave a message.  We will attempt to reach you two times.  During this call, we will ask if you have developed any symptoms of COVID 19. If you develop any symptoms (ie: fever, flu-like symptoms, shortness of breath, cough etc.) before then, please call [phone].  If you test positive for Covid 19 in the 2 weeks post procedure, please call and report this information to [name].    If any biopsies were taken you will be contacted by phone or by letter within the next 1-3 weeks.  Please call us at [phone] if you have not heard about the biopsies in 3 weeks.    SIGNATURES/CONFIDENTIALITY: You and/or your care partner have signed paperwork which will be entered into your electronic medical record.  These signatures attest to the fact that that the information above on your After Visit Summary has been reviewed and is understood.  Full responsibility of the confidentiality of this discharge  information lies with you and/or your care-partner. 

## 2019-11-24 NOTE — Op Note (Signed)
[place] Patient Name: Yvonne [name] Procedure Date: 11/24/2019 10:13 AM MRN: [id number] Endoscopist: Thornton Park MD, MD Age: 49 Referring MD:  Date of Birth: 1971/04/21 Gender: Female Account #: [id number] Procedure:                Colonoscopy Indications:              Screening for colorectal malignant neoplasm, This                            is the patient's first colonoscopy                           No known family history of colon cancer or polyps Medicines:                Monitored Anesthesia Care Procedure:                Pre-Anesthesia Assessment:                           - Prior to the procedure, a History and Physical                            was performed, and patient medications and                            allergies were reviewed. The patient's tolerance of                            previous anesthesia was also reviewed. The risks                            and benefits of the procedure and the sedation                            options and risks were discussed with the patient.                            All questions were answered, and informed consent                            was obtained. Prior Anticoagulants: The patient has                            taken no previous anticoagulant or antiplatelet                            agents. ASA Grade Assessment: II - A patient with                            mild systemic disease. After reviewing the risks                            and benefits, the patient was deemed in  satisfactory condition to undergo the procedure.                           After obtaining informed consent, the colonoscope                            was passed under direct vision. Throughout the                            procedure, the patient's blood pressure, pulse, and                            oxygen saturations were monitored continuously. The                            Colonoscope was  introduced through the anus and                            advanced to the 4 cm into the ileum. A second                            forward view of the right colon was performed. The                            colonoscopy was performed without difficulty. The                            patient tolerated the procedure well. The quality                            of the bowel preparation was good. The terminal                            ileum, ileocecal valve, appendiceal orifice, and                            rectum were photographed. Scope In: 10:18:04 AM Scope Out: 10:34:03 AM Scope Withdrawal Time: 0 hours 10 minutes 4 seconds  Total Procedure Duration: 0 hours 15 minutes 59 seconds  Findings:                 The perianal and digital rectal examinations were                            normal.                           A 1 mm polyp was found in the descending colon. The                            polyp was sessile. The polyp was removed with a                            cold biopsy forceps. Resection and retrieval were  complete. Estimated blood loss was minimal.                           The exam was otherwise without abnormality on                            direct and retroflexion views. Complications:            No immediate complications. Estimated blood loss:                            Minimal. Estimated Blood Loss:     Estimated blood loss was minimal. Impression:               - One 1 mm polyp in the descending colon, removed                            with a cold biopsy forceps. Resected and retrieved.                           - The examination was otherwise normal on direct                            and retroflexion views. Recommendation:           - Patient has a contact number available for                            emergencies. The signs and symptoms of potential                            delayed complications were discussed with the                             patient. Return to normal activities tomorrow.                            Written discharge instructions were provided to the                            patient.                           - Resume previous diet.                           - Continue present medications.                           - Await pathology results.                           - Repeat colonoscopy date to be determined after                            pending pathology results are reviewed for  surveillance.                           - Emerging evidence supports eating a diet of                            fruits, vegetables, grains, calcium, and yogurt                            while reducing red meat and alcohol may reduce the                            risk of colon cancer.                           - Thank you for allowing me to be involved in your                            colon cancer prevention. Thornton Park MD, MD 11/24/2019 10:39:01 AM This report has been signed electronically.

## 2019-11-24 NOTE — Progress Notes (Signed)
Pt's states no medical or surgical changes since previsit or office visit.  Temp LC Vitals DT 

## 2019-11-24 NOTE — Progress Notes (Signed)
PT taken to PACU. Monitors in place. VSS. Report given to RN. 

## 2019-11-29 ENCOUNTER — Telehealth: Payer: Self-pay

## 2019-11-29 ENCOUNTER — Encounter: Payer: Self-pay | Admitting: Gastroenterology

## 2019-11-29 NOTE — Telephone Encounter (Signed)
  Follow up Call-  Call back number 11/24/2019  Post procedure Call Back phone  # [phone]  Permission to leave phone message Yes  Some recent data might be hidden     Patient questions:  Do you have a fever, pain , or abdominal swelling? No. Pain Score  0 *  Have you tolerated food without any problems? Yes.    Have you been able to return to your normal activities? Yes   Do you have any questions about your discharge instructions: Diet   No. Medications  No. Follow up visit  No.  Do you have questions or concerns about your Care? No.  Actions: * If pain score is 4 or above: No action needed, pain <4.  1. Have you developed a fever since your procedure? no  2.   Have you had an respiratory symptoms (SOB or cough) since your procedure? no  3.   Have you tested positive for COVID 19 since your procedure no  4.   Have you had any family members/close contacts diagnosed with the COVID 19 since your procedure?  no   If yes to any of these questions please route to Joylene John, RN and Erenest Rasher, RN

## 2019-12-09 ENCOUNTER — Other Ambulatory Visit: Payer: Self-pay | Admitting: Nurse Practitioner

## 2019-12-20 ENCOUNTER — Ambulatory Visit (INDEPENDENT_AMBULATORY_CARE_PROVIDER_SITE_OTHER): Payer: 59 | Admitting: Nurse Practitioner

## 2019-12-20 ENCOUNTER — Other Ambulatory Visit: Payer: Self-pay

## 2019-12-20 ENCOUNTER — Encounter: Payer: Self-pay | Admitting: Nurse Practitioner

## 2019-12-20 VITALS — BP 116/70 | HR 66 | Temp 98.3°F | Ht 67.0 in | Wt 178.2 lb

## 2019-12-20 DIAGNOSIS — E663 Overweight: Secondary | ICD-10-CM

## 2019-12-20 DIAGNOSIS — F32A Depression, unspecified: Secondary | ICD-10-CM

## 2019-12-20 DIAGNOSIS — E782 Mixed hyperlipidemia: Secondary | ICD-10-CM

## 2019-12-20 DIAGNOSIS — R001 Bradycardia, unspecified: Secondary | ICD-10-CM

## 2019-12-20 DIAGNOSIS — F329 Major depressive disorder, single episode, unspecified: Secondary | ICD-10-CM

## 2019-12-20 NOTE — Progress Notes (Signed)
This visit occurred during the SARS-CoV-2 public health emergency.  Safety protocols were in place, including screening questions prior to the visit, additional usage of staff PPE, and extensive cleaning of exam room while observing appropriate contact time as indicated for disinfecting solutions.  Subjective:     Patient ID: Yvonne [name] , female    DOB: 08/29/1970 , 49 y.o.   MRN: [id number]   Chief Complaint  Patient presents with  . Hyperlipidemia    HPI  She is moving more since the change in the weather.   Wt Readings from Last 3 Encounters: 12/20/19 : 178 lb 3.2 oz (80.8 kg) 11/24/19 : 178 lb (80.7 kg) 11/10/19 : 178 lb (80.7 kg)  She is unable to run due to her knee giving out in recent months and now is walking.    Hyperlipidemia This is a chronic problem. The current episode started more than 1 year ago. The problem is controlled. Recent lipid tests were reviewed and are normal. She has no history of chronic renal disease. There are no known factors aggravating her hyperlipidemia. Pertinent negatives include no chest pain. Current antihyperlipidemic treatment includes exercise. The current treatment provides no improvement of lipids. There are no compliance problems.      Past Medical History:  Diagnosis Date  . Allergy   . Fibroid 2005  . H/O menorrhagia 01/16/11  . Herpes 2009  . HPV in female   . Hx: UTI (urinary tract infection) 05/06/11  . Hyperlipidemia   . PONV (postoperative nausea and vomiting)   . Vulvitis 02/04/11  . Yeast infection 06/19/2010     Family History  Problem Relation Age of Onset  . Hypertension Mother   . Heart disease Mother   . Diabetes Father   . Alcohol abuse Father   . Diabetes Brother   . Hypertension Brother   . Alcohol abuse Brother   . Heart attack Maternal Grandfather   . Stomach cancer Maternal Grandfather   . Hypertension Brother   . Crohn's disease Other        mat 2nd cousin  . Irritable bowel syndrome Other    mat. nephew  . Colon cancer Neg Hx   . Breast cancer Neg Hx   . Colon polyps Neg Hx   . Esophageal cancer Neg Hx   . Rectal cancer Neg Hx      Current Outpatient Medications:  .  Adapalene 0.3 % gel, SMARTSIG:1 Pump Topical Every Night, Disp: , Rfl:  .  Cyanocobalamin (VITAMIN B 12 PO), Take by mouth., Disp: , Rfl:  .  erythromycin with ethanol (EMGEL) 2 % gel, Apply topically as needed., Disp: , Rfl:  .  ipratropium (ATROVENT) 0.03 % nasal spray, Place 1 spray into both nostrils daily. , Disp: , Rfl: 2 .  MAGNESIUM PO, Take by mouth., Disp: , Rfl:  .  Methylcellulose, Laxative, (CITRUCEL PO), Take by mouth., Disp: , Rfl:  .  Multiple Vitamins-Minerals (CENTRUM ADULTS PO), Take by mouth., Disp: , Rfl:  .  Omega-3 Fatty Acids (FISH OIL PO), Take by mouth., Disp: , Rfl:  .  PARoxetine (PAXIL) 10 MG tablet, TAKE 1 TABLET BY MOUTH EVERY DAY, Disp: 90 tablet, Rfl: 1 .  valACYclovir (VALTREX) 1000 MG tablet, TAKE 1 TABLET BY MOUTH EVERY 12 HOURS, Disp: 60 tablet, Rfl: 0   Allergies  Allergen Reactions  . Sulfa Antibiotics Itching     Review of Systems  Constitutional: Negative.   Respiratory: Negative.  Negative for cough.  Cardiovascular: Negative for chest pain, palpitations and leg swelling.  Endocrine: Negative for polydipsia, polyphagia and polyuria.  Neurological: Negative for dizziness and headaches.  Psychiatric/Behavioral: Negative.      Today's Vitals   12/20/19 1023  BP: 116/70  Pulse: 66  Temp: 98.3 F (36.8 C)  Weight: 178 lb 3.2 oz (80.8 kg)  Height: 5\' 7"  (1.702 m)  PainSc: 0-No pain   Body mass index is 27.91 kg/m.   Objective:  Physical Exam Constitutional:      General: She is not in acute distress.    Appearance: Normal appearance.  Cardiovascular:     Rate and Rhythm: Normal rate and regular rhythm.     Pulses: Normal pulses.     Heart sounds: Normal heart sounds. No murmur.  Pulmonary:     Effort: Pulmonary effort is normal. No respiratory  distress.     Breath sounds: Normal breath sounds.  Skin:    Capillary Refill: Capillary refill takes less than 2 seconds.  Neurological:     General: No focal deficit present.     Mental Status: She is alert and oriented to person, place, and time.     Cranial Nerves: No cranial nerve deficit.  Psychiatric:        Mood and Affect: Mood normal.        Behavior: Behavior normal.        Thought Content: Thought content normal.        Judgment: Judgment normal.         Assessment And Plan:     1. Mixed hyperlipidemia  Chronic, will check lipid panel  2. Depression, unspecified depression type  Chronic  Continue with current medications  3. Overweight  Will check ECG before starting phentermine  EKG done with NSR with bradycardia of 56  Phentermine 15 mg started daily. Discussed side effects to include palpiations, fast heart rate and dry mouth   Minette Brine, FNP    THE PATIENT IS ENCOURAGED TO PRACTICE SOCIAL DISTANCING DUE TO THE COVID-19 PANDEMIC.

## 2019-12-21 ENCOUNTER — Encounter: Payer: Self-pay | Admitting: Nurse Practitioner

## 2019-12-21 ENCOUNTER — Other Ambulatory Visit: Payer: Self-pay | Admitting: Nurse Practitioner

## 2019-12-21 DIAGNOSIS — E663 Overweight: Secondary | ICD-10-CM

## 2019-12-21 LAB — LIPID PANEL
Chol/HDL Ratio: 3.4 ratio (ref 0.0–4.4)
Cholesterol, Total: 258 mg/dL — ABNORMAL HIGH (ref 100–199)
HDL: 75 mg/dL (ref >39)
LDL Chol Calc (NIH): 167 mg/dL — ABNORMAL HIGH (ref 0–99)
Triglycerides: 95 mg/dL (ref 0–149)
VLDL Cholesterol Cal: 16 mg/dL (ref 5–40)

## 2019-12-21 MED ORDER — PHENTERMINE HCL 15 MG PO CAPS: 15.0000 mg | ORAL_CAPSULE | ORAL | 1 refills | Status: DC

## 2020-01-04 ENCOUNTER — Other Ambulatory Visit: Payer: Self-pay | Admitting: Nurse Practitioner

## 2020-01-18 ENCOUNTER — Other Ambulatory Visit: Payer: Self-pay | Admitting: Nurse Practitioner

## 2020-02-02 ENCOUNTER — Other Ambulatory Visit: Payer: Self-pay

## 2020-02-02 ENCOUNTER — Encounter: Payer: Self-pay | Admitting: Nurse Practitioner

## 2020-02-02 MED ORDER — IPRATROPIUM BROMIDE 0.03 % NA SOLN: 1.0000 | Freq: Every day | NASAL | 2 refills | Status: DC

## 2020-02-14 ENCOUNTER — Telehealth: Payer: Self-pay | Admitting: Nurse Practitioner

## 2020-02-16 ENCOUNTER — Other Ambulatory Visit: Payer: Self-pay

## 2020-02-16 ENCOUNTER — Telehealth (INDEPENDENT_AMBULATORY_CARE_PROVIDER_SITE_OTHER): Payer: 59 | Admitting: Nurse Practitioner

## 2020-02-16 ENCOUNTER — Encounter: Payer: Self-pay | Admitting: Nurse Practitioner

## 2020-02-16 VITALS — Ht 65.0 in | Wt 161.3 lb

## 2020-02-16 DIAGNOSIS — R0982 Postnasal drip: Secondary | ICD-10-CM | POA: Diagnosis not present

## 2020-02-16 MED ORDER — NOREL AD 4-10-325 MG PO TABS: 1.0000 | ORAL_TABLET | Freq: Two times a day (BID) | ORAL | 1 refills | Status: DC | PRN

## 2020-02-16 MED ORDER — LEVOCETIRIZINE DIHYDROCHLORIDE 5 MG PO TABS: 5.0000 mg | ORAL_TABLET | Freq: Every evening | ORAL | 1 refills | Status: DC

## 2020-02-16 NOTE — Progress Notes (Signed)
Virtual Visit via Video   This visit type was conducted due to national recommendations for restrictions regarding the COVID-19 Pandemic (e.g. social distancing) in an effort to limit this patient's exposure and mitigate transmission in our community.  Due to her co-morbid illnesses, this patient is at least at moderate risk for complications without adequate follow up.  This format is felt to be most appropriate for this patient at this time.  All issues noted in this document were discussed and addressed.  A limited physical exam was performed with this format.    This visit type was conducted due to national recommendations for restrictions regarding the COVID-19 Pandemic (e.g. social distancing) in an effort to limit this patient's exposure and mitigate transmission in our community.  Patients identity confirmed using two different identifiers.  This format is felt to be most appropriate for this patient at this time.  All issues noted in this document were discussed and addressed.  No physical exam was performed (except for noted visual exam findings with Video Visits).    Date:  02/26/2020   ID:  Yvonne [name], DOB [birth year], MRN [id number]  Patient Location:  Patient was outside - spoke with Regino Schultze  Provider location:   Office    Chief Complaint:  Postnasal drip and cough  History of Present Illness:    Yvonne [name] is a 49 y.o. female who presents via video conferencing for a telehealth visit today.    The patient does not have symptoms concerning for COVID-19 infection (fever, chills, cough, or new shortness of breath).   She did try taking allegra about one month ago.  She does not feel congested. Denies headache. She has tested for covid which was negative. She had her covid vaccine in February.   Sinus Problem This is a recurrent problem. The current episode started 1 to 4 weeks ago. The problem has been gradually improving since onset. There has been no fever.  She is experiencing no pain. Associated symptoms include coughing (intermittent hacking cough). Pertinent negatives include no chills, congestion, sinus pressure, sneezing or sore throat. (Post nasal drainage) Treatments tried: ipratropium. The treatment provided no relief.      Past Medical History:  Diagnosis Date   Allergy    Fibroid 2005   H/O menorrhagia 01/16/11   Herpes 2009   HPV in female    Hx: UTI (urinary tract infection) 05/06/11   Hyperlipidemia    PONV (postoperative nausea and vomiting)    Vulvitis 02/04/11   Yeast infection 06/19/2010   Past Surgical History:  Procedure Laterality Date   CESAREAN SECTION  2008 & 2009   COLONOSCOPY  11/24/2019   CRYOTHERAPY  2009   CYSTOSCOPY  06/15/2012   Procedure: CYSTOSCOPY;  Surgeon: Delice Lesch, MD;  Location: [facility];  Service: Gynecology;  Laterality: N/A;   LAPAROSCOPIC HYSTERECTOMY  06/15/2012   Procedure: HYSTERECTOMY TOTAL LAPAROSCOPIC;  Surgeon: Delice Lesch, MD;  Location: [facility];  Service: Gynecology;  Laterality: N/A;   MYOMECTOMY  2005   TUBAL LIGATION     bilateral     Current Meds  Medication Sig   Adapalene 0.3 % gel SMARTSIG:1 Pump Topical Every Night   Cyanocobalamin (VITAMIN B 12 PO) Take by mouth.   erythromycin with ethanol (EMGEL) 2 % gel Apply topically as needed.   ipratropium (ATROVENT) 0.03 % nasal spray Place 1 spray into both nostrils daily.   MAGNESIUM PO Take by mouth.   Methylcellulose, Laxative, (CITRUCEL PO) Take by  mouth.   Multiple Vitamins-Minerals (CENTRUM ADULTS PO) Take by mouth.   Omega-3 Fatty Acids (FISH OIL PO) Take by mouth.   PARoxetine (PAXIL) 10 MG tablet TAKE 1 TABLET BY MOUTH EVERY DAY   phentermine 15 MG capsule Take 1 capsule (15 mg total) by mouth every morning.   valACYclovir (VALTREX) 1000 MG tablet TAKE 1 TABLET BY MOUTH EVERY 12 HOURS     Allergies:   Sulfa antibiotics   Social History   Tobacco Use   Smoking status: Never  Smoker   Smokeless tobacco: Never Used  Vaping Use   Vaping Use: Never used  Substance Use Topics   Alcohol use: Yes    Alcohol/week: 6.0 standard drinks    Types: 6 Glasses of wine per week   Drug use: No     Family Hx: The patient's family history includes Alcohol abuse in her brother and father; Crohn's disease in an other family member; Diabetes in her brother and father; Heart attack in her maternal grandfather; Heart disease in her mother; Hypertension in her brother, brother, and mother; Irritable bowel syndrome in an other family member; Stomach cancer in her maternal grandfather. There is no history of Colon cancer, Breast cancer, Colon polyps, Esophageal cancer, or Rectal cancer.  ROS:   Please see the history of present illness.    Review of Systems  Constitutional: Negative.  Negative for chills.  HENT: Negative for congestion, sinus pressure, sneezing and sore throat.        Postnasal drip  Respiratory: Positive for cough (intermittent hacking cough). Negative for sputum production.   Cardiovascular: Negative.   Neurological: Negative for dizziness and tingling.  Psychiatric/Behavioral: Negative.     All other systems reviewed and are negative.   Labs/Other Tests and Data Reviewed:    Recent Labs: 06/21/2019: ALT 20; BUN 9; Creatinine, Ser 0.77; Potassium 4.0; Sodium 143   Recent Lipid Panel Lab Results  Component Value Date/Time   CHOL 258 (H) 12/20/2019 12:20 PM   TRIG 95 12/20/2019 12:20 PM   HDL 75 12/20/2019 12:20 PM   CHOLHDL 3.4 12/20/2019 12:20 PM   LDLCALC 167 (H) 12/20/2019 12:20 PM    Wt Readings from Last 3 Encounters:  02/16/20 161 lb 4.8 oz (73.2 kg)  12/20/19 178 lb 3.2 oz (80.8 kg)  11/24/19 178 lb (80.7 kg)     Exam:    Vital Signs:  Ht 5\' 5"  (1.651 m)    Wt 161 lb 4.8 oz (73.2 kg)    LMP 05/25/2012    BMI 26.84 kg/m     Physical Exam Constitutional:      General: She is not in acute distress.    Appearance: Normal  appearance.  Cardiovascular:     Rate and Rhythm: Normal rate and regular rhythm.     Pulses: Normal pulses.     Heart sounds: Normal heart sounds. No murmur heard.   Pulmonary:     Effort: Pulmonary effort is normal. No respiratory distress.     Breath sounds: Normal breath sounds.  Neurological:     General: No focal deficit present.     Mental Status: She is alert and oriented to person, place, and time.  Psychiatric:        Mood and Affect: Mood normal.        Behavior: Behavior normal.        Thought Content: Thought content normal.        Judgment: Judgment normal.     ASSESSMENT &  PLAN:    1. Post-nasal drainage  Sent Rx for norel to see if this helps and she is to take xyzal  Encouraged to stay well hydrated  Her cough is likely related to allergies  - NOREL AD 4-10-325 MG TABS; Take 1 tablet by mouth 2 (two) times daily as needed.  Dispense: 84 tablet; Refill: 1 - levocetirizine (XYZAL) 5 MG tablet; Take 1 tablet (5 mg total) by mouth every evening.  Dispense: 30 tablet; Refill: 1  COVID-19 Education: The signs and symptoms of COVID-19 were discussed with the patient and how to seek care for testing (follow up with PCP or arrange E-visit).  The importance of social distancing was discussed today.  Patient Risk:   After full review of this patients clinical status, I feel that they are at least moderate risk at this time.  Time:   Today, I have spent 11 minutes/ seconds with the patient with telehealth technology discussing above diagnoses.     Medication Adjustments/Labs and Tests Ordered: Current medicines are reviewed at length with the patient today.  Concerns regarding medicines are outlined above.   Tests Ordered: No orders of the defined types were placed in this encounter.   Medication Changes: Meds ordered this encounter  Medications   NOREL AD 4-10-325 MG TABS    Sig: Take 1 tablet by mouth 2 (two) times daily as needed.    Dispense:  84 tablet     Refill:  1   levocetirizine (XYZAL) 5 MG tablet    Sig: Take 1 tablet (5 mg total) by mouth every evening.    Dispense:  30 tablet    Refill:  1    Disposition:  Follow up prn  Signed, Minette Brine, FNP

## 2020-02-16 NOTE — Telephone Encounter (Signed)
.  I connected with  Yvonne [name] on 02/16/20 by a video enabled telemedicine application and verified that I am speaking with the correct person using two identifiers.   I discussed the limitations of evaluation and management by telemedicine. The patient expressed understanding and agreed to proceed.  Pt agreed to verbal consent

## 2020-02-23 ENCOUNTER — Encounter: Payer: Self-pay | Admitting: Nurse Practitioner

## 2020-02-29 ENCOUNTER — Encounter: Payer: Self-pay | Admitting: Nurse Practitioner

## 2020-02-29 ENCOUNTER — Ambulatory Visit: Payer: 59 | Admitting: Nurse Practitioner

## 2020-02-29 ENCOUNTER — Other Ambulatory Visit: Payer: Self-pay

## 2020-02-29 DIAGNOSIS — E663 Overweight: Secondary | ICD-10-CM | POA: Diagnosis not present

## 2020-02-29 MED ORDER — PHENTERMINE HCL 15 MG PO CAPS: 15.0000 mg | ORAL_CAPSULE | ORAL | 1 refills | Status: DC

## 2020-02-29 NOTE — Progress Notes (Signed)
This visit occurred during the SARS-CoV-2 public health emergency.  Safety protocols were in place, including screening questions prior to the visit, additional usage of staff PPE, and extensive cleaning of exam room while observing appropriate contact time as indicated for disinfecting solutions.  Subjective:     Patient ID: Yvonne [name] , female    DOB: 1970/12/04 , 49 y.o.   MRN: [id number]   Chief Complaint  Patient presents with  . Weight Check    HPI  Wt Readings from Last 3 Encounters: 02/29/20 : 160 lb 3.2 oz (72.7 kg) 02/16/20 : 161 lb 4.8 oz (73.2 kg) 12/20/19 : 178 lb 3.2 oz (80.8 kg)  She is doing well with the phentermine 15 mg daily, she is walking more regularly. She continues to have problems with her left knee - will be having an MRI the next visit with the orthopedics.  Occasionally will feel jittery feeling.      Past Medical History:  Diagnosis Date  . Allergy   . Fibroid 2005  . H/O menorrhagia 01/16/11  . Herpes 2009  . HPV in female   . Hx: UTI (urinary tract infection) 05/06/11  . Hyperlipidemia   . PONV (postoperative nausea and vomiting)   . Vulvitis 02/04/11  . Yeast infection 06/19/2010     Family History  Problem Relation Age of Onset  . Hypertension Mother   . Heart disease Mother   . Diabetes Father   . Alcohol abuse Father   . Diabetes Brother   . Hypertension Brother   . Alcohol abuse Brother   . Heart attack Maternal Grandfather   . Stomach cancer Maternal Grandfather   . Hypertension Brother   . Crohn's disease Other        mat 2nd cousin  . Irritable bowel syndrome Other        mat. nephew  . Colon cancer Neg Hx   . Breast cancer Neg Hx   . Colon polyps Neg Hx   . Esophageal cancer Neg Hx   . Rectal cancer Neg Hx      Current Outpatient Medications:  .  Adapalene 0.3 % gel, SMARTSIG:1 Pump Topical Every Night, Disp: , Rfl:  .  Cyanocobalamin (VITAMIN B 12 PO), Take by mouth., Disp: , Rfl:  .  erythromycin with ethanol  (EMGEL) 2 % gel, Apply topically as needed., Disp: , Rfl:  .  ipratropium (ATROVENT) 0.03 % nasal spray, Place 1 spray into both nostrils daily., Disp: 30 mL, Rfl: 2 .  levocetirizine (XYZAL) 5 MG tablet, Take 1 tablet (5 mg total) by mouth every evening., Disp: 30 tablet, Rfl: 1 .  MAGNESIUM PO, Take by mouth., Disp: , Rfl:  .  Methylcellulose, Laxative, (CITRUCEL PO), Take by mouth., Disp: , Rfl:  .  Multiple Vitamins-Minerals (CENTRUM ADULTS PO), Take by mouth., Disp: , Rfl:  .  NOREL AD 4-10-325 MG TABS, Take 1 tablet by mouth 2 (two) times daily as needed., Disp: 84 tablet, Rfl: 1 .  Omega-3 Fatty Acids (FISH OIL PO), Take by mouth., Disp: , Rfl:  .  PARoxetine (PAXIL) 10 MG tablet, TAKE 1 TABLET BY MOUTH EVERY DAY, Disp: 90 tablet, Rfl: 1 .  phentermine 15 MG capsule, Take 1 capsule (15 mg total) by mouth every morning., Disp: 30 capsule, Rfl: 1 .  valACYclovir (VALTREX) 1000 MG tablet, TAKE 1 TABLET BY MOUTH EVERY 12 HOURS, Disp: 180 tablet, Rfl: 1   Allergies  Allergen Reactions  . Sulfa Antibiotics Itching  Review of Systems  Constitutional: Negative.   Respiratory: Negative.   Cardiovascular: Negative.  Negative for chest pain, palpitations and leg swelling.  Neurological: Negative for dizziness and headaches.  Psychiatric/Behavioral: Negative.      Today's Vitals   02/29/20 1456  BP: 118/76  Pulse: 74  Temp: 98.3 F (36.8 C)  TempSrc: Oral  Weight: 160 lb 3.2 oz (72.7 kg)  Height: 5\' 5"  (1.651 m)  PainSc: 0-No pain   Body mass index is 26.66 kg/m.   Objective:  Physical Exam Vitals reviewed.  Constitutional:      General: She is not in acute distress.    Appearance: Normal appearance.  Cardiovascular:     Rate and Rhythm: Normal rate and regular rhythm.     Pulses: Normal pulses.     Heart sounds: Normal heart sounds. No murmur heard.   Pulmonary:     Effort: Pulmonary effort is normal. No respiratory distress.     Breath sounds: Normal breath sounds.   Skin:    Capillary Refill: Capillary refill takes less than 2 seconds.  Neurological:     General: No focal deficit present.     Mental Status: She is alert.  Psychiatric:        Mood and Affect: Mood normal.        Thought Content: Thought content normal.        Judgment: Judgment normal.         Assessment And Plan:     1. Overweight She is doing well with phentermine Has lost approximately 16 lbs since last visit  Will keep the same dose and encouraged to continue with healthy diet and regular physical activity - phentermine 15 MG capsule; Take 1 capsule (15 mg total) by mouth every morning.  Dispense: 30 capsule; Refill: 1   Minette Brine, FNP    THE PATIENT IS ENCOURAGED TO PRACTICE SOCIAL DISTANCING DUE TO THE COVID-19 PANDEMIC.

## 2020-03-09 ENCOUNTER — Other Ambulatory Visit: Payer: Self-pay | Admitting: Nurse Practitioner

## 2020-03-09 DIAGNOSIS — R0982 Postnasal drip: Secondary | ICD-10-CM

## 2020-04-19 ENCOUNTER — Other Ambulatory Visit: Payer: Self-pay | Admitting: Orthopaedic Surgery

## 2020-04-22 ENCOUNTER — Other Ambulatory Visit: Payer: Self-pay | Admitting: Nurse Practitioner

## 2020-04-22 DIAGNOSIS — R0982 Postnasal drip: Secondary | ICD-10-CM

## 2020-04-26 ENCOUNTER — Other Ambulatory Visit: Payer: Self-pay | Admitting: Nurse Practitioner

## 2020-05-03 ENCOUNTER — Other Ambulatory Visit: Payer: Self-pay | Admitting: Nurse Practitioner

## 2020-05-03 DIAGNOSIS — R0982 Postnasal drip: Secondary | ICD-10-CM

## 2020-05-10 ENCOUNTER — Encounter (HOSPITAL_BASED_OUTPATIENT_CLINIC_OR_DEPARTMENT_OTHER): Payer: Self-pay | Admitting: Orthopaedic Surgery

## 2020-05-10 ENCOUNTER — Other Ambulatory Visit: Payer: Self-pay

## 2020-05-10 NOTE — H&P (Signed)
Patsy Zaragoza is an 49 y.o. female.   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 She has had a couple of years of trouble.  She received an injection more than a year back which did help for quite a while but then has had two subsequently which have not helped very much.  She underwent an MRI scan which has shown a meniscus tear.  She uses a brace on her knee and takes Advil.  She has pain mostly with twisting maneuvers and quick motions.  This is becoming quite limiting to her and she is starting to gain some weight as she cannot really exercise.  This will wake her from sleep.  She thinks the problem is getting worse and describes her pain as intermittent and severe.    MRI:  I reviewed an MRI scan films and report of a study done at the [facility] on 04/03/20.  This shows a horizontal tear of the medial meniscus posterior horn.  She has some mild to moderate degenerative change but nothing severe.  Past Medical History:  Diagnosis Date  . Allergy   . Depression   . Fibroid 2005  . H/O menorrhagia 01/16/11  . Herpes 2009  . HPV in female   . Hx: UTI (urinary tract infection) 05/06/11  . Hyperlipidemia   . PONV (postoperative nausea and vomiting)   . Vulvitis 02/04/11  . Yeast infection 06/19/2010    Past Surgical History:  Procedure Laterality Date  . CESAREAN SECTION  2008 & 2009  . COLONOSCOPY  11/24/2019  . CRYOTHERAPY  2009  . CYSTOSCOPY  06/15/2012   Procedure: CYSTOSCOPY;  Surgeon: Delice Lesch, MD;  Location: [facility];  Service: Gynecology;  Laterality: N/A;  . LAPAROSCOPIC HYSTERECTOMY  06/15/2012   Procedure: HYSTERECTOMY TOTAL LAPAROSCOPIC;  Surgeon: Delice Lesch, MD;  Location: [facility];  Service: Gynecology;  Laterality: N/A;  . MYOMECTOMY  2005  . TUBAL LIGATION     bilateral    Family History  Problem Relation Age of Onset  . Hypertension Mother   . Heart disease Mother   . Diabetes Father   . Alcohol abuse  Father   . Diabetes Brother   . Hypertension Brother   . Alcohol abuse Brother   . Heart attack Maternal Grandfather   . Stomach cancer Maternal Grandfather   . Hypertension Brother   . Crohn's disease Other        mat 2nd cousin  . Irritable bowel syndrome Other        mat. nephew  . Colon cancer Neg Hx   . Breast cancer Neg Hx   . Colon polyps Neg Hx   . Esophageal cancer Neg Hx   . Rectal cancer Neg Hx    Social History:  reports that she has never smoked. She has never used smokeless tobacco. She reports current alcohol use of about 6.0 standard drinks of alcohol per week. She reports that she does not use drugs.  Allergies:  Allergies  Allergen Reactions  . Sulfa Antibiotics Itching    No medications prior to admission.    No results found for this or any previous visit (from the past 48 hour(s)). No results found.  Review of Systems  Musculoskeletal: Positive for arthralgias.       Left knee   All other systems reviewed and are negative.   Height 5\' 5"  (1.651 m), weight 73.9 kg, last menstrual period 05/25/2012. Physical  Exam Constitutional:      Appearance: Normal appearance.  HENT:     Head: Normocephalic and atraumatic.     Mouth/Throat:     Pharynx: Oropharynx is clear.  Eyes:     Extraocular Movements: Extraocular movements intact.  Cardiovascular:     Rate and Rhythm: Normal rate and regular rhythm.  Pulmonary:     Effort: Pulmonary effort is normal.  Abdominal:     Palpations: Abdomen is soft.  Musculoskeletal:     Cervical back: Normal range of motion.     Comments: Left knee has trace effusion.  She has medial joint line pain and pain on hyperflexion.  Her motion is 0-135.  McMurray's test is positive for pain in the medial direction.  Her ligaments are stable.  Hip motion is full and straight leg raise is negative.    Skin:    General: Skin is warm and dry.  Neurological:     General: No focal deficit present.     Mental Status: She is  alert and oriented to person, place, and time.  Psychiatric:        Mood and Affect: Mood normal.        Behavior: Behavior normal.      Assessment/Plan Assessment: Left knee torn medial meniscus by MRI 2021 injected 2  Plan: I think we can help Jaylin with a knee arthroscopy. I reviewed risks of anesthesia and infection as well as potential for DVT related to a knee arthroscopy.  I've stressed the importance of some postoperative physical therapy to optimize results and we will try to set up an appointment.  Two to four  weeks for recovery would be typical but that is a little variable.  Larwance Sachs Lori-Ann Lindfors, PA-C 05/10/2020, 1:44 PM

## 2020-05-12 ENCOUNTER — Other Ambulatory Visit (HOSPITAL_COMMUNITY)
Admission: RE | Admit: 2020-05-12 | Discharge: 2020-05-12 | Disposition: A | Discharge status: Home / Self Care | Payer: 59 | Source: Ambulatory Visit | Attending: Orthopaedic Surgery | Admitting: Orthopaedic Surgery

## 2020-05-12 DIAGNOSIS — Z20822 Contact with and (suspected) exposure to covid-19: Secondary | ICD-10-CM | POA: Insufficient documentation

## 2020-05-12 DIAGNOSIS — Z01812 Encounter for preprocedural laboratory examination: Secondary | ICD-10-CM | POA: Insufficient documentation

## 2020-05-12 LAB — SARS CORONAVIRUS 2 (TAT 6-24 HRS): SARS Coronavirus 2: NEGATIVE

## 2020-05-12 NOTE — Progress Notes (Signed)

## 2020-05-15 ENCOUNTER — Other Ambulatory Visit: Payer: Self-pay

## 2020-05-15 NOTE — Patient Instructions (Addendum)
DUE TO COVID-19 ONLY ONE VISITOR IS ALLOWED TO COME WITH YOU AND STAY IN THE WAITING ROOM ONLY DURING PRE OP AND PROCEDURE.        Your procedure is scheduled on: Tuesday, Sept. 21, 2021   Report to [facility] Main  Entrance    Report to admitting at 12:15 PM   Call this number if you have problems the morning of surgery [phone]   Do not eat food :After Midnight.   May have liquids until 11:15 AM    day of surgery  CLEAR LIQUID DIET  Foods Allowed                                                                     Foods Excluded  Water, Black Coffee and tea, regular and decaf                             liquids that you cannot  Plain Jell-O in any flavor  (No red)                                           see through such as: Fruit ices (not with fruit pulp)                                     milk, soups, orange juice              Iced Popsicles (No red)                                    All solid food                                   Apple juices Sports drinks like Gatorade (No red) Lightly seasoned clear broth or consume(fat free) Sugar, honey syrup  Sample Menu Breakfast                                Lunch                                     Supper Cranberry juice                    Beef broth                            Chicken broth Jell-O                                     Grape juice  Apple juice Coffee or tea                        Jell-O                                      Popsicle                                                Coffee or tea                        Coffee or tea      Complete one Ensure drink the morning of surgery at 11:15AM the day of surgery.   Oral Hygiene is also important to reduce your risk of infection.                                    Remember - BRUSH YOUR TEETH THE MORNING OF SURGERY WITH YOUR REGULAR TOOTHPASTE   Do NOT smoke after Midnight   Take these medicines the morning of surgery with A  SIP OF WATER: Paroxetine, Valtrex                               You may not have any metal on your body including hair pins, jewelry, and body piercings             Do not wear make-up, lotions, powders, perfumes/cologne, or deodorant             Do not wear nail polish.  Do not shave  48 hours prior to surgery.                Do not bring valuables to the hospital. [facility].   Contacts, dentures or bridgework may not be worn into surgery.    Patients discharged the day of surgery will not be allowed to drive home.   Special Instructions: Bring a copy of your healthcare power of attorney and living will documents         the day of surgery if you haven't scanned them in before.              Please read over the following fact sheets you were given: [facility] [phone]

## 2020-05-16 ENCOUNTER — Encounter (HOSPITAL_COMMUNITY): Payer: Self-pay | Admitting: Orthopaedic Surgery

## 2020-05-16 ENCOUNTER — Ambulatory Visit (HOSPITAL_COMMUNITY): Payer: 59 | Admitting: Anesthesiology

## 2020-05-16 ENCOUNTER — Ambulatory Visit (HOSPITAL_COMMUNITY)
Admission: RE | Admit: 2020-05-16 | Discharge: 2020-05-16 | Disposition: A | Discharge status: Home / Self Care | Payer: 59 | Attending: Orthopaedic Surgery | Admitting: Orthopaedic Surgery

## 2020-05-16 ENCOUNTER — Encounter (HOSPITAL_COMMUNITY)
Admission: RE | Disposition: A | Discharge status: Home / Self Care | Payer: Self-pay | Source: Home / Self Care | Attending: Orthopaedic Surgery

## 2020-05-16 DIAGNOSIS — M23222 Derangement of posterior horn of medial meniscus due to old tear or injury, left knee: Secondary | ICD-10-CM | POA: Diagnosis not present

## 2020-05-16 DIAGNOSIS — E785 Hyperlipidemia, unspecified: Secondary | ICD-10-CM | POA: Insufficient documentation

## 2020-05-16 DIAGNOSIS — Z882 Allergy status to sulfonamides status: Secondary | ICD-10-CM | POA: Diagnosis not present

## 2020-05-16 DIAGNOSIS — F329 Major depressive disorder, single episode, unspecified: Secondary | ICD-10-CM | POA: Insufficient documentation

## 2020-05-16 DIAGNOSIS — Z811 Family history of alcohol abuse and dependence: Secondary | ICD-10-CM | POA: Diagnosis not present

## 2020-05-16 DIAGNOSIS — Z8379 Family history of other diseases of the digestive system: Secondary | ICD-10-CM | POA: Diagnosis not present

## 2020-05-16 DIAGNOSIS — Z833 Family history of diabetes mellitus: Secondary | ICD-10-CM | POA: Insufficient documentation

## 2020-05-16 DIAGNOSIS — Z800 Family history of malignant neoplasm of digestive organs: Secondary | ICD-10-CM | POA: Diagnosis not present

## 2020-05-16 DIAGNOSIS — Z87440 Personal history of urinary (tract) infections: Secondary | ICD-10-CM | POA: Insufficient documentation

## 2020-05-16 DIAGNOSIS — Z90710 Acquired absence of both cervix and uterus: Secondary | ICD-10-CM | POA: Diagnosis not present

## 2020-05-16 DIAGNOSIS — M94262 Chondromalacia, left knee: Secondary | ICD-10-CM | POA: Diagnosis not present

## 2020-05-16 DIAGNOSIS — Z8249 Family history of ischemic heart disease and other diseases of the circulatory system: Secondary | ICD-10-CM | POA: Diagnosis not present

## 2020-05-16 HISTORY — DX: Depression, unspecified: F32.A

## 2020-05-16 HISTORY — PX: KNEE ARTHROSCOPY: SHX127

## 2020-05-16 LAB — CBC
HCT: 43.3 % (ref 36.0–46.0)
Hemoglobin: 14.6 g/dL (ref 12.0–15.0)
MCH: 31.7 pg (ref 26.0–34.0)
MCHC: 33.7 g/dL (ref 30.0–36.0)
MCV: 93.9 fL (ref 80.0–100.0)
Platelets: 316 10*3/uL (ref 150–400)
RBC: 4.61 MIL/uL (ref 3.87–5.11)
RDW: 12.6 % (ref 11.5–15.5)
WBC: 3.3 10*3/uL — ABNORMAL LOW (ref 4.0–10.5)
nRBC: 0 % (ref 0.0–0.2)

## 2020-05-16 SURGERY — ARTHROSCOPY, KNEE
Anesthesia: General | Site: Knee | Laterality: Left

## 2020-05-16 MED ORDER — LACTATED RINGERS IV SOLN: INTRAVENOUS | Status: DC

## 2020-05-16 MED ORDER — ONDANSETRON 4 MG PO TBDP
ORAL_TABLET | ORAL | Status: AC
  Filled 2020-05-16: qty 1

## 2020-05-16 MED ORDER — OXYCODONE HCL 5 MG/5ML PO SOLN: 5.0000 mg | Freq: Once | ORAL | Status: DC | PRN

## 2020-05-16 MED ORDER — DEXAMETHASONE SODIUM PHOSPHATE 10 MG/ML IJ SOLN
INTRAMUSCULAR | Status: DC | PRN
  Administered 2020-05-16: 10 mg via INTRAVENOUS

## 2020-05-16 MED ORDER — PROPOFOL 10 MG/ML IV BOLUS
INTRAVENOUS | Status: AC
  Filled 2020-05-16: qty 20

## 2020-05-16 MED ORDER — ONDANSETRON HCL 4 MG/2ML IJ SOLN
INTRAMUSCULAR | Status: AC
  Filled 2020-05-16: qty 2

## 2020-05-16 MED ORDER — KETOROLAC TROMETHAMINE 30 MG/ML IJ SOLN
30.0000 mg | Freq: Once | INTRAMUSCULAR | Status: AC | PRN
  Administered 2020-05-16: 30 mg via INTRAVENOUS

## 2020-05-16 MED ORDER — HYDROMORPHONE HCL 1 MG/ML IJ SOLN
0.2500 mg | INTRAMUSCULAR | Status: DC | PRN
  Administered 2020-05-16 (×4): 0.5 mg via INTRAVENOUS

## 2020-05-16 MED ORDER — OXYCODONE HCL 5 MG PO TABS: 5.0000 mg | ORAL_TABLET | Freq: Once | ORAL | Status: DC | PRN

## 2020-05-16 MED ORDER — FENTANYL CITRATE (PF) 100 MCG/2ML IJ SOLN
INTRAMUSCULAR | Status: AC
  Filled 2020-05-16: qty 2

## 2020-05-16 MED ORDER — ONDANSETRON HCL 4 MG/2ML IJ SOLN
INTRAMUSCULAR | Status: DC | PRN
  Administered 2020-05-16: 4 mg via INTRAVENOUS

## 2020-05-16 MED ORDER — PROPOFOL 10 MG/ML IV BOLUS
INTRAVENOUS | Status: AC
  Filled 2020-05-16: qty 40

## 2020-05-16 MED ORDER — DEXAMETHASONE SODIUM PHOSPHATE 10 MG/ML IJ SOLN
INTRAMUSCULAR | Status: AC
  Filled 2020-05-16: qty 1

## 2020-05-16 MED ORDER — PROPOFOL 10 MG/ML IV BOLUS
INTRAVENOUS | Status: DC | PRN
  Administered 2020-05-16: 60 mg via INTRAVENOUS
  Administered 2020-05-16: 200 mg via INTRAVENOUS

## 2020-05-16 MED ORDER — OXYCODONE HCL 5 MG PO TABS
ORAL_TABLET | ORAL | Status: AC
  Filled 2020-05-16: qty 1

## 2020-05-16 MED ORDER — EPINEPHRINE PF 1 MG/ML IJ SOLN
INTRAMUSCULAR | Status: AC
  Filled 2020-05-16: qty 1

## 2020-05-16 MED ORDER — CEFAZOLIN SODIUM-DEXTROSE 2-4 GM/100ML-% IV SOLN
2.0000 g | INTRAVENOUS | Status: AC
  Administered 2020-05-16: 2 g via INTRAVENOUS
  Filled 2020-05-16: qty 100

## 2020-05-16 MED ORDER — HYDROCODONE-ACETAMINOPHEN 5-325 MG PO TABS: 1.0000 | ORAL_TABLET | Freq: Four times a day (QID) | ORAL | 0 refills | Status: DC | PRN

## 2020-05-16 MED ORDER — HYDROMORPHONE HCL 1 MG/ML IJ SOLN
INTRAMUSCULAR | Status: AC
  Filled 2020-05-16: qty 1

## 2020-05-16 MED ORDER — CHLORHEXIDINE GLUCONATE 0.12 % MT SOLN
15.0000 mL | OROMUCOSAL | Status: AC
  Administered 2020-05-16: 15 mL via OROMUCOSAL

## 2020-05-16 MED ORDER — BUPIVACAINE-EPINEPHRINE 0.5% -1:200000 IJ SOLN
INTRAMUSCULAR | Status: DC | PRN
  Administered 2020-05-16: 15 mL

## 2020-05-16 MED ORDER — BUPIVACAINE-EPINEPHRINE (PF) 0.5% -1:200000 IJ SOLN
INTRAMUSCULAR | Status: AC
  Filled 2020-05-16: qty 30

## 2020-05-16 MED ORDER — LIDOCAINE 2% (20 MG/ML) 5 ML SYRINGE
INTRAMUSCULAR | Status: DC | PRN
  Administered 2020-05-16: 100 mg via INTRAVENOUS

## 2020-05-16 MED ORDER — EPINEPHRINE PF 1 MG/ML IJ SOLN
INTRAMUSCULAR | Status: DC | PRN
  Administered 2020-05-16: 1 mg

## 2020-05-16 MED ORDER — PROMETHAZINE HCL 12.5 MG PO TABS: 12.5000 mg | ORAL_TABLET | Freq: Four times a day (QID) | ORAL | 0 refills | Status: DC | PRN

## 2020-05-16 MED ORDER — MIDAZOLAM HCL 2 MG/2ML IJ SOLN
INTRAMUSCULAR | Status: DC | PRN
  Administered 2020-05-16: 2 mg via INTRAVENOUS

## 2020-05-16 MED ORDER — MEPERIDINE HCL 50 MG/ML IJ SOLN: 6.2500 mg | INTRAMUSCULAR | Status: DC | PRN

## 2020-05-16 MED ORDER — MIDAZOLAM HCL 2 MG/2ML IJ SOLN
INTRAMUSCULAR | Status: AC
  Filled 2020-05-16: qty 2

## 2020-05-16 MED ORDER — LIDOCAINE 2% (20 MG/ML) 5 ML SYRINGE
INTRAMUSCULAR | Status: AC
  Filled 2020-05-16: qty 5

## 2020-05-16 MED ORDER — SCOPOLAMINE 1 MG/3DAYS TD PT72
MEDICATED_PATCH | TRANSDERMAL | Status: DC | PRN
  Administered 2020-05-16: 1 via TRANSDERMAL

## 2020-05-16 MED ORDER — HYDROMORPHONE HCL 1 MG/ML IJ SOLN
INTRAMUSCULAR | Status: DC
Start: 2020-05-16 — End: 2020-05-17
  Filled 2020-05-16: qty 1

## 2020-05-16 MED ORDER — PROMETHAZINE HCL 25 MG/ML IJ SOLN
INTRAMUSCULAR | Status: AC
  Filled 2020-05-16: qty 1

## 2020-05-16 MED ORDER — KETOROLAC TROMETHAMINE 30 MG/ML IJ SOLN
INTRAMUSCULAR | Status: AC
  Filled 2020-05-16: qty 1

## 2020-05-16 MED ORDER — ONDANSETRON 4 MG PO TBDP
4.0000 mg | ORAL_TABLET | Freq: Once | ORAL | Status: AC
  Administered 2020-05-16: 4 mg via ORAL

## 2020-05-16 MED ORDER — PROMETHAZINE HCL 25 MG/ML IJ SOLN
6.2500 mg | INTRAMUSCULAR | Status: DC | PRN
  Administered 2020-05-16: 6.25 mg via INTRAVENOUS

## 2020-05-16 MED ORDER — SODIUM CHLORIDE 0.9 % IR SOLN
Status: DC | PRN
  Administered 2020-05-16: 3000 mL

## 2020-05-16 MED ORDER — FENTANYL CITRATE (PF) 100 MCG/2ML IJ SOLN
INTRAMUSCULAR | Status: DC | PRN
Start: 2020-05-16 — End: 2020-05-16
  Administered 2020-05-16 (×3): 50 ug via INTRAVENOUS

## 2020-05-16 SURGICAL SUPPLY — 32 items
BLADE EXCALIBUR 4.0X13 (MISCELLANEOUS) ×2 IMPLANT
BNDG ELASTIC 6X5.8 VLCR STR LF (GAUZE/BANDAGES/DRESSINGS) ×2 IMPLANT
BNDG GAUZE ELAST 4 BULKY (GAUZE/BANDAGES/DRESSINGS) ×2 IMPLANT
COVER SURGICAL LIGHT HANDLE (MISCELLANEOUS) ×2 IMPLANT
COVER WAND RF STERILE (DRAPES) ×2 IMPLANT
CUFF TOURN SGL QUICK 42 (TOURNIQUET CUFF) ×2 IMPLANT
DISSECTOR 3.5MM X 13CM (MISCELLANEOUS) ×2 IMPLANT
DRAPE ARTHROSCOPY W/POUCH 114 (DRAPES) ×2 IMPLANT
DRAPE SHEET LG 3/4 BI-LAMINATE (DRAPES) ×2 IMPLANT
DRAPE U-SHAPE 47X51 STRL (DRAPES) ×2 IMPLANT
DRSG EMULSION OIL 3X3 NADH (GAUZE/BANDAGES/DRESSINGS) ×2 IMPLANT
DRSG PAD ABDOMINAL 8X10 ST (GAUZE/BANDAGES/DRESSINGS) ×4 IMPLANT
DURAPREP 26ML APPLICATOR (WOUND CARE) ×2 IMPLANT
GAUZE 4X4 16PLY RFD (DISPOSABLE) ×2 IMPLANT
GAUZE SPONGE 4X4 12PLY STRL (GAUZE/BANDAGES/DRESSINGS) ×2 IMPLANT
GLOVE BIO SURGEON STRL SZ8 (GLOVE) ×4 IMPLANT
GLOVE BIOGEL PI IND STRL 8 (GLOVE) ×2 IMPLANT
GLOVE BIOGEL PI INDICATOR 8 (GLOVE) ×2
GOWN STRL REUS W/ TWL XL LVL3 (GOWN DISPOSABLE) ×2 IMPLANT
GOWN STRL REUS W/TWL XL LVL3 (GOWN DISPOSABLE) ×2
KIT BASIN OR (CUSTOM PROCEDURE TRAY) ×2 IMPLANT
KIT TURNOVER KIT A (KITS) ×2 IMPLANT
MANIFOLD NEPTUNE II (INSTRUMENTS) ×2 IMPLANT
NEEDLE HYPO 22GX1.5 SAFETY (NEEDLE) ×2 IMPLANT
NEEDLE SPNL 18GX3.5 QUINCKE PK (NEEDLE) IMPLANT
PACK ARTHROSCOPY WL (CUSTOM PROCEDURE TRAY) ×2 IMPLANT
PAD ARMBOARD 7.5X6 YLW CONV (MISCELLANEOUS) ×4 IMPLANT
PENCIL SMOKE EVACUATOR (MISCELLANEOUS) IMPLANT
SYR CONTROL 10ML LL (SYRINGE) ×2 IMPLANT
TOWEL OR 17X26 10 PK STRL BLUE (TOWEL DISPOSABLE) ×2 IMPLANT
TUBING ARTHROSCOPY IRRIG 16FT (MISCELLANEOUS) ×2 IMPLANT
WATER STERILE IRR 1000ML POUR (IV SOLUTION) ×2 IMPLANT

## 2020-05-16 NOTE — Op Note (Signed)
Yvonne [name] [id number] 05/16/2020   PRE-OP DIAGNOSIS: left knee TMM and CP  POST-OP DIAGNOSIS: same  PROCEDURE: left knee scope with PMM and CP  ANESTHESIA: general  Hessie Dibble   Dictation #:  [id number]

## 2020-05-16 NOTE — Anesthesia Postprocedure Evaluation (Signed)
Anesthesia Post Note  Patient: Yvonne [name]  Procedure([name]) Performed: LEFT KNEE ARTHROSCOPY, PARTIAL MENISCECTOMY (Left Knee)     Patient location during evaluation: PACU Anesthesia Type: General Level of consciousness: awake and alert Pain management: pain level controlled Vital Signs Assessment: post-procedure vital signs reviewed and stable Respiratory status: spontaneous breathing, nonlabored ventilation, respiratory function stable and patient connected to nasal cannula oxygen Cardiovascular status: blood pressure returned to baseline and stable Postop Assessment: no apparent nausea or vomiting Anesthetic complications: no   No complications documented.  Last Vitals:  Vitals:   05/16/20 1535 05/16/20 1545  BP: 132/90 (!) 144/88  Pulse: 78 70  Resp: 18 17  Temp:    SpO2: 100% 100%    Last Pain:  Vitals:   05/16/20 1545  TempSrc:   PainSc: 10-Worst pain ever                 Yvonne [name]

## 2020-05-16 NOTE — Interval H&P Note (Signed)
History and Physical Interval Note:  05/16/2020 1:51 PM  Yvonne [name]  has presented today for surgery, with the diagnosis of LEFT KNEE MEDIAL MENISCUS [name].  The various methods of treatment have been discussed with the patient and family. After consideration of risks, benefits and other options for treatment, the patient has consented to  Procedure(s): LEFT KNEE ARTHROSCOPY (Left) as a surgical intervention.  The patient's history has been reviewed, patient examined, no change in status, stable for surgery.  I have reviewed the patient's chart and labs.  Questions were answered to the patient's satisfaction.     Hessie Dibble

## 2020-05-16 NOTE — Anesthesia Preprocedure Evaluation (Signed)
Anesthesia Evaluation  Patient identified by MRN, date of birth, ID band Patient awake    Reviewed: Allergy & Precautions, H&P , NPO status , Patient's Chart, lab work & pertinent test results  History of Anesthesia Complications (+) PONV and history of anesthetic complications  Airway Mallampati: I  TM Distance: >3 FB Neck ROM: Full    Dental  (+) Implants, Dental Advisory Given, Teeth Intact   Pulmonary neg pulmonary ROS,    Pulmonary exam normal breath sounds clear to auscultation       Cardiovascular negative cardio ROS Normal cardiovascular exam Rhythm:Regular Rate:Normal     Neuro/Psych PSYCHIATRIC DISORDERS Depression negative neurological ROS     GI/Hepatic negative GI ROS, Neg liver ROS,   Endo/Other  negative endocrine ROS  Renal/GU negative Renal ROS     Musculoskeletal negative musculoskeletal ROS (+)   Abdominal   Peds  Hematology negative hematology ROS (+)   Anesthesia Other Findings   Reproductive/Obstetrics                            Anesthesia Physical  Anesthesia Plan  ASA: II  Anesthesia Plan: General   Post-op Pain Management:    Induction: Intravenous  PONV Risk Score and Plan: 4 or greater and Ondansetron, Dexamethasone, Midazolam, Treatment may vary due to age or medical condition and Scopolamine patch - Pre-op  Airway Management Planned: LMA  Additional Equipment: None  Intra-op Plan:   Post-operative Plan:   Informed Consent: I have reviewed the patients History and Physical, chart, labs and discussed the procedure including the risks, benefits and alternatives for the proposed anesthesia with the patient or authorized representative who has indicated his/her understanding and acceptance.     Dental advisory given  Plan Discussed with: CRNA  Anesthesia Plan Comments:        Anesthesia Quick Evaluation

## 2020-05-16 NOTE — Op Note (Signed)
NAMELEONORE, FRANKSON MEDICAL RECORD YJ:[id number] ACCOUNT [id number] DATE OF BIRTH:[birth year] FACILITY: [facility] LOCATION: [facility] PHYSICIAN:Augusten Lipkin Autumn Patty, MD  OPERATIVE REPORT  DATE OF PROCEDURE:  05/16/2020  PREOPERATIVE DIAGNOSES: 1.  Left knee torn medial meniscus. 2.  Left knee chondromalacia patella.  POSTOPERATIVE DIAGNOSES: 1.  Left knee torn medial meniscus. 2.  Left knee chondromalacia patella.  PROCEDURE: 1.  Left knee partial medial meniscectomy. 2.  Left knee abrasion chondroplasty of patellofemoral.  ANESTHESIA:  General.  ATTENDING SURGEON:  Melrose Nakayama, MD   ASSISTANT:  Clarene Critchley, PA.  INDICATIONS:  The patient is a 49 year old woman with a long history of left knee pain.  This has persisted despite conservative measures.  By MRI scan, she has a medial meniscus tear and some mild degenerative changes.  She is offered an arthroscopy.   Informed operative consent was obtained after discussion of possible complications, including reaction to anesthesia and infection.  SUMMARY OF FINDINGS AND PROCEDURE:  Under general anesthesia, an arthroscopy of the left knee was performed.  Suprapatellar pouch was benign while the patellofemoral joint exhibited some focal breakdown on the undersurface of the patella, addressed with  chondroplasty and abrasion to bleeding bone and some small areas.  The patella tracked well.  Medial compartment exhibited a posterior horn and middle horn medial meniscus tear, addressed with about a 10% partial medial meniscectomy.  This was done with  basket and shaver.  ACL looked normal and the lateral compartment was completely benign.  She was scheduled to go home the same day.  DESCRIPTION OF PROCEDURE:  The patient was taken to the operating suite where general anesthetic was applied without difficulty.  She was positioned supine and prepped and draped in normal sterile fashion.  After the administration of preoperative IV  Kefzol and  appropriate time-out, an arthroscopy of the left knee was performed through a total of 2 portals.  Findings were as noted above and procedure consisted of the partial medial meniscectomy, which was done with a basket and shaver back to stable  tissues, taking about 10% of the meniscus in this process.  She had no significant degenerative change in the medial compartment.  We then performed the abrasion chondroplasty of patellofemoral as described above.  The knee was thoroughly irrigated,  followed by removal of arthroscopic equipment.  We placed Adaptic over the portals, followed by dry gauze and a loose Ace wrap.  Estimated blood loss and intraoperative fluids can be obtained from anesthesia records.  DISPOSITION:  The patient was extubated in the operating room and taken to recovery room in stable condition.  She was to go home same day and follow up in the office in less than a week.  I will contact her by phone tonight.  VN/NUANCE  D:05/16/2020 T:05/16/2020 JOB:[id number]

## 2020-05-16 NOTE — Transfer of Care (Signed)
Immediate Anesthesia Transfer of Care Note  Patient: Yvonne [name]  Procedure(s) Performed: LEFT KNEE ARTHROSCOPY, PARTIAL MENISCECTOMY (Left Knee)  Patient Location: PACU  Anesthesia Type:General  Level of Consciousness: awake, alert  and oriented  Airway & Oxygen Therapy: Patient Spontanous Breathing and Patient connected to face mask oxygen  Post-op Assessment: Report given to RN and Post -op Vital signs reviewed and stable  Post vital signs: Reviewed and stable  Last Vitals:  Vitals Value Taken Time  BP 132/91 05/16/20 1526  Temp    Pulse 81 05/16/20 1529  Resp 22 05/16/20 1529  SpO2 100 % 05/16/20 1529  Vitals shown include unvalidated device data.  Last Pain:  Vitals:   05/16/20 1214  TempSrc:   PainSc: 6       Patients Stated Pain Goal: 5 (03/49/17 9150)  Complications: No complications documented.

## 2020-05-16 NOTE — Anesthesia Procedure Notes (Signed)
Procedure Name: LMA Insertion Date/Time: 05/16/2020 2:43 PM Performed by: [place] Gokul Waybright C, CRNA Pre-anesthesia Checklist: Patient identified, Emergency Drugs available, Suction available and Patient being monitored Patient Re-evaluated:Patient Re-evaluated prior to induction Oxygen Delivery Method: Circle system utilized Preoxygenation: Pre-oxygenation with 100% oxygen Induction Type: IV induction Ventilation: Mask ventilation without difficulty LMA: LMA inserted LMA Size: 4.0 Number of attempts: 1 Airway Equipment and Method: Bite block Placement Confirmation: positive ETCO2 Tube secured with: Tape Dental Injury: Teeth and Oropharynx as per pre-operative assessment

## 2020-05-17 ENCOUNTER — Encounter (HOSPITAL_COMMUNITY): Payer: Self-pay | Admitting: Orthopaedic Surgery

## 2020-06-21 ENCOUNTER — Encounter: Payer: BC Managed Care – PPO | Admitting: Nurse Practitioner

## 2020-06-21 NOTE — Progress Notes (Signed)
Pt not seen due to being positive for covid 10 days ago. YL,RMA

## 2020-06-23 ENCOUNTER — Other Ambulatory Visit: Payer: Self-pay | Admitting: Nurse Practitioner

## 2020-07-17 ENCOUNTER — Encounter: Payer: 59 | Admitting: Nurse Practitioner

## 2020-07-18 ENCOUNTER — Other Ambulatory Visit: Payer: Self-pay

## 2020-07-18 DIAGNOSIS — Z0000 Encounter for general adult medical examination without abnormal findings: Secondary | ICD-10-CM

## 2020-07-18 DIAGNOSIS — R7309 Other abnormal glucose: Secondary | ICD-10-CM

## 2020-07-18 DIAGNOSIS — Z1159 Encounter for screening for other viral diseases: Secondary | ICD-10-CM

## 2020-07-18 DIAGNOSIS — E782 Mixed hyperlipidemia: Secondary | ICD-10-CM

## 2020-07-18 NOTE — Progress Notes (Signed)
Patient came to get labs today. YL,RMA

## 2020-07-19 ENCOUNTER — Encounter: Payer: Self-pay | Admitting: Nurse Practitioner

## 2020-07-19 ENCOUNTER — Other Ambulatory Visit: Payer: Self-pay

## 2020-07-19 ENCOUNTER — Ambulatory Visit: Payer: 59 | Admitting: Nurse Practitioner

## 2020-07-19 VITALS — BP 118/62 | HR 62 | Temp 98.1°F | Ht 66.2 in | Wt 169.8 lb

## 2020-07-19 DIAGNOSIS — E785 Hyperlipidemia, unspecified: Secondary | ICD-10-CM | POA: Diagnosis not present

## 2020-07-19 DIAGNOSIS — E559 Vitamin D deficiency, unspecified: Secondary | ICD-10-CM | POA: Diagnosis not present

## 2020-07-19 DIAGNOSIS — R232 Flushing: Secondary | ICD-10-CM

## 2020-07-19 DIAGNOSIS — Z0000 Encounter for general adult medical examination without abnormal findings: Secondary | ICD-10-CM

## 2020-07-19 DIAGNOSIS — H6123 Impacted cerumen, bilateral: Secondary | ICD-10-CM

## 2020-07-19 LAB — CBC
Hematocrit: 41.1 % (ref 34.0–46.6)
Hemoglobin: 14.1 g/dL (ref 11.1–15.9)
MCH: 30.6 pg (ref 26.6–33.0)
MCHC: 34.3 g/dL (ref 31.5–35.7)
MCV: 89 fL (ref 79–97)
Platelets: 275 10*3/uL (ref 150–450)
RBC: 4.61 x10E6/uL (ref 3.77–5.28)
RDW: 12.2 % (ref 11.7–15.4)
WBC: 4.4 10*3/uL (ref 3.4–10.8)

## 2020-07-19 LAB — LIPID PANEL
Chol/HDL Ratio: 3.8 ratio (ref 0.0–4.4)
Cholesterol, Total: 294 mg/dL — ABNORMAL HIGH (ref 100–199)
HDL: 78 mg/dL (ref >39)
LDL Chol Calc (NIH): 202 mg/dL — ABNORMAL HIGH (ref 0–99)
Triglycerides: 85 mg/dL (ref 0–149)
VLDL Cholesterol Cal: 14 mg/dL (ref 5–40)

## 2020-07-19 LAB — CMP14+EGFR
ALT: 16 IU/L (ref 0–32)
AST: 13 IU/L (ref 0–40)
Albumin/Globulin Ratio: 1.7 (ref 1.2–2.2)
Albumin: 4.3 g/dL (ref 3.8–4.9)
Alkaline Phosphatase: 74 IU/L (ref 44–121)
BUN/Creatinine Ratio: 13 (ref 9–23)
BUN: 10 mg/dL (ref 6–24)
Bilirubin Total: 0.2 mg/dL (ref 0.0–1.2)
CO2: 26 mmol/L (ref 20–29)
Calcium: 9.8 mg/dL (ref 8.7–10.2)
Chloride: 100 mmol/L (ref 96–106)
Creatinine, Ser: 0.77 mg/dL (ref 0.57–1.00)
GFR calc Af Amer: 103 mL/min/{1.73_m2} (ref >59)
GFR calc non Af Amer: 90 mL/min/{1.73_m2} (ref >59)
Globulin, Total: 2.5 g/dL (ref 1.5–4.5)
Glucose: 101 mg/dL — ABNORMAL HIGH (ref 65–99)
Potassium: 4.8 mmol/L (ref 3.5–5.2)
Sodium: 140 mmol/L (ref 134–144)
Total Protein: 6.8 g/dL (ref 6.0–8.5)

## 2020-07-19 LAB — HEMOGLOBIN A1C
Est. average glucose Bld gHb Est-mCnc: 128 mg/dL
Hgb A1c MFr Bld: 6.1 % — ABNORMAL HIGH (ref 4.8–5.6)

## 2020-07-19 LAB — HEPATITIS C ANTIBODY: Hep C Virus Ab: <0.1 s/co ratio (ref 0.0–0.9)

## 2020-07-19 MED ORDER — PAROXETINE HCL 20 MG PO TABS: 20.0000 mg | ORAL_TABLET | Freq: Every day | ORAL | 2 refills | Status: DC

## 2020-07-19 MED ORDER — NEXLETOL 180 MG PO TABS: ORAL_TABLET | ORAL | 1 refills | Status: DC

## 2020-07-19 NOTE — Progress Notes (Signed)
I,Tianna Badgett,acting as a Education administrator for [employer], NP.,have documented all relevant documentation on the behalf of [employer], NP,as directed by  Bary Castilla, NP while in the presence of Bary Castilla, NP.  This visit occurred during the SARS-CoV-2 public health emergency.  Safety protocols were in place, including screening questions prior to the visit, additional usage of staff PPE, and extensive cleaning of exam room while observing appropriate contact time as indicated for disinfecting solutions.  Subjective:     Patient ID: Yvonne [name] , female    DOB: 1971/01/17 , 48 y.o.   MRN: [id number]   Chief Complaint  Patient presents with  . Annual Exam    HPI  Patient is here for full physical exam. She reports compliance with medications. She concerns with the increase in number of hot flashes that she is having. Labs were reviewed with the patient. She is not doing a lot of moving around. She is not dieting at this time. She does not want to take medication right now for diabetes. She wants to try dieting and exercising. The Paxil 10 mg is not working anymore as much as it used to for her hot flashes. Patient is willing to try medication for her cholesterol. She has not tolerated statins in the past.    She is taking magnesium which has helped with constipation.  She also will get in contact with her OBGYN for a PAP.    Wt Readings from Last 3 Encounters: 07/19/20 : 169 lb 12.8 oz (77 kg) 05/16/20 : 167 lb 3.2 oz (75.8 kg) 02/29/20 : 160 lb 3.2 oz (72.7 kg)      Past Medical History:  Diagnosis Date  . Allergy   . Depression   . Fibroid 2005  . H/O menorrhagia 01/16/11  . Herpes 2009  . HPV in female   . Hx: UTI (urinary tract infection) 05/06/11  . Hyperlipidemia   . PONV (postoperative nausea and vomiting)   . Vulvitis 02/04/11  . Yeast infection 06/19/2010     Family History  Problem Relation Age of Onset  . Hypertension Mother   . Heart  disease Mother   . Diabetes Father   . Alcohol abuse Father   . Diabetes Brother   . Hypertension Brother   . Alcohol abuse Brother   . Heart attack Maternal Grandfather   . Stomach cancer Maternal Grandfather   . Hypertension Brother   . Crohn's disease Other        mat 2nd cousin  . Irritable bowel syndrome Other        mat. nephew  . Colon cancer Neg Hx   . Breast cancer Neg Hx   . Colon polyps Neg Hx   . Esophageal cancer Neg Hx   . Rectal cancer Neg Hx      Current Outpatient Medications:  .  acetaminophen (TYLENOL) 325 MG tablet, Take 1,300 mg by mouth every 6 (six) hours as needed for mild pain, fever or headache., Disp: , Rfl:  .  Adapalene 0.3 % gel, Apply 1 application topically as needed (zits). , Disp: , Rfl:  .  Cyanocobalamin (VITAMIN B 12 PO), Take 1 tablet by mouth daily. , Disp: , Rfl:  .  erythromycin with ethanol (EMGEL) 2 % gel, Apply topically as needed., Disp: , Rfl:  .  ibuprofen (ADVIL) 200 MG tablet, Take 800 mg by mouth every 6 (six) hours as needed for fever, headache or mild pain., Disp: , Rfl:  .  ipratropium (  ATROVENT) 0.03 % nasal spray, PLACE 1 SPRAY INTO BOTH NOSTRILS DAILY (Patient taking differently: Place 2 sprays into both nostrils daily. ), Disp: 30 mL, Rfl: 1 .  MAGNESIUM PO, Take 1 tablet by mouth daily. , Disp: , Rfl:  .  Multiple Vitamins-Minerals (CENTRUM ADULTS PO), Take 1 tablet by mouth daily. , Disp: , Rfl:  .  Omega-3 Fatty Acids (FISH OIL PO), Take 1 capsule by mouth daily. , Disp: , Rfl:  .  polyvinyl alcohol (LIQUIFILM TEARS) 1.4 % ophthalmic solution, Place 1 drop into both eyes as needed for dry eyes., Disp: , Rfl:  .  valACYclovir (VALTREX) 1000 MG tablet, TAKE 1 TABLET BY MOUTH EVERY 12 HOURS (Patient taking differently: Take 1,000 mg by mouth every 12 (twelve) hours as needed (break out). ), Disp: 180 tablet, Rfl: 1 .  VITAMIN D PO, Take 1 capsule by mouth daily., Disp: , Rfl:  .  Bempedoic Acid (NEXLETOL) 180 MG TABS, Take 1  tablet by mouth, Disp: 30 tablet, Rfl: 1 .  PARoxetine (PAXIL) 20 MG tablet, Take 1 tablet (20 mg total) by mouth daily., Disp: 30 tablet, Rfl: 2   Allergies  Allergen Reactions  . Sulfa Antibiotics Itching     The patient states she uses for birth control. Last LMP was Patient's last menstrual period was 05/25/2012... Negative for: breast discharge, breast lump(s), breast pain and breast self exam. Associated symptoms include abnormal vaginal bleeding. Pertinent negatives include abnormal bleeding (hematology), anxiety, decreased libido, depression, difficulty falling sleep, dyspareunia, history of infertility, nocturia, sexual dysfunction, sleep disturbances, urinary incontinence, urinary urgency, vaginal discharge and vaginal itching. Diet regular.The patient states her exercise level is    . The patient's tobacco use is:   Social History   Tobacco Use  Smoking Status Never Smoker  Smokeless Tobacco Never Used  . She has been exposed to passive smoke. The patient's alcohol use is:  Social History   Substance and Sexual Activity  Alcohol Use Yes  . Alcohol/week: 6.0 standard drinks  . Types: 6 Glasses of wine per week  . Additional information: Last pap 2013, next one scheduled for patient will call OBGYN and schedule it.     Review of Systems  Constitutional: Positive for fatigue.  HENT: Negative.   Eyes: Negative.  Negative for visual disturbance.  Respiratory: Negative.  Negative for shortness of breath and wheezing.   Cardiovascular: Negative.  Negative for chest pain and leg swelling.  Gastrointestinal: Negative.  Negative for abdominal pain, constipation and diarrhea.  Endocrine: Negative.  Negative for cold intolerance and heat intolerance.  Genitourinary: Negative.   Musculoskeletal: Negative.   Skin: Negative.  Negative for rash.  Allergic/Immunologic: Negative.   Neurological: Negative.  Negative for dizziness and headaches.  Hematological: Negative.    Psychiatric/Behavioral: Negative.      Today's Vitals   07/19/20 1422  BP: 118/62  Pulse: 62  Temp: 98.1 F (36.7 C)  TempSrc: Oral  Weight: 169 lb 12.8 oz (77 kg)  Height: 5' 6.2" (1.681 m)   Body mass index is 27.24 kg/m.  Wt Readings from Last 3 Encounters:  07/19/20 169 lb 12.8 oz (77 kg)  05/16/20 167 lb 3.2 oz (75.8 kg)  02/29/20 160 lb 3.2 oz (72.7 kg)     Objective:  Physical Exam Constitutional:      Appearance: Normal appearance.  HENT:     Head: Normocephalic and atraumatic.     Right Ear: There is impacted cerumen.     Left Ear:  Tympanic membrane normal. There is no impacted cerumen.     Nose:     Comments: Deffered. Patient was masked     Mouth/Throat:     Comments: Deffered. Patient masked.  Eyes:     Extraocular Movements: Extraocular movements intact.     Pupils: Pupils are equal, round, and reactive to light.  Cardiovascular:     Rate and Rhythm: Normal rate and regular rhythm.     Pulses: Normal pulses.     Heart sounds: Normal heart sounds.  Pulmonary:     Effort: Pulmonary effort is normal. No respiratory distress.     Breath sounds: Normal breath sounds. No wheezing.  Abdominal:     General: Bowel sounds are normal.     Palpations: Abdomen is soft.     Tenderness: There is no abdominal tenderness. There is no guarding.  Genitourinary:    Comments: Deferred. Declined by patient. She wants to see a OBGYN  Musculoskeletal:        General: Normal range of motion.     Cervical back: Normal range of motion.  Skin:    General: Skin is warm and dry.     Capillary Refill: Capillary refill takes less than 2 seconds.  Neurological:     Mental Status: She is alert and oriented to person, place, and time.     Motor: No weakness.  Psychiatric:        Mood and Affect: Mood normal.        Behavior: Behavior normal.        Thought Content: Thought content normal.         Assessment And Plan:     1. Encounter for general adult medical  examination w/o abnormal findings -Went over patients labs with patient which included lipid panel, Hep C antibody, Hmg A1c, CBC, CMP14+EGFR.  -Patient given the opportunity to ask questions.  -Full physical exam was performed. Importance of monthly breast self-exam was discussed with the patient.  -Patient will make appointment with OBGYN for Paps. Declined at this visit.  -Advised patient to exercise 4-5 times a week for at least 30-45 min. daily  -Follow a healthy diet which includes increasing the intake of fruits and vegetables and avoiding fatty foods, fast food, and red meats.  -Increase intake of baked or broiled foods. Avoid processed and fried foods.  -Increase water intake.   2. Hyperlipidemia, unspecified hyperlipidemia type - Bempedoic Acid (NEXLETOL) 180 MG TABS; Take 1 tablet by mouth  Dispense: 30 tablet; Refill: 1 -Discussed lipid panel results with patient.  -Discussed that the patient's total cholesterol increased from 258 to 294 now. LDL went from 167 one year ago to 202.  -Patient cant tolerate statins. In the past she had tingling.  -Given samples of Nexletol (2 packs) and a prescription was sent to her pharmacy to see if her insurance will cover it. Patient was given the opportunity to ask questions and was advised about side-effects of nexletol. She was advised to stop the medication immediately and seek emergency care if she experienced any life-threatening side-effects from the medication.   3. Hot flashes -Patient has a history of hot flashes. She was taking paxil 10 mg which was not helping her much with the hot flashes.  -Started paxil 20 mg once daily to see if that will help with hot flashes.  -Educated patient to eat a well-balanced healthy diet which may help with hot flashes.  -Advised patient if she has any questions or concerns to  contact us at the office.   4. Vitamin D deficiency -Vit D was added to labs  -will let patient know when results come back.   -Advised patient to get plenty of sunlight and take OTC vitamin D until result comes back and we need to adjust her dose.   5. Impacted cerumen of both ears -Cerumen in the right ear.  -Used ear lavage. Good success   Patient was given samples of nextelol 180 mg (2 packages) to try and see if she can tolerate it for her cholesterol/LDL. She was also advised of the side-effects.   Patient was given opportunity to ask questions. Patient verbalized understanding of the plan and was able to repeat key elements of the plan. All questions were answered to their satisfaction.   Bary Castilla, NP   I, Bary Castilla, NP, have reviewed all documentation for this visit. The documentation on 07/19/20 for the exam, diagnosis, procedures, and orders are all accurate and complete.  THE PATIENT IS ENCOURAGED TO PRACTICE SOCIAL DISTANCING DUE TO THE COVID-19 PANDEMIC.

## 2020-07-19 NOTE — Patient Instructions (Signed)
Health Maintenance, Female Adopting a healthy lifestyle and getting preventive care are important in promoting health and wellness. Ask your health care provider about:  The right schedule for you to have regular tests and exams.  Things you can do on your own to prevent diseases and keep yourself healthy. What should I know about diet, weight, and exercise? Eat a healthy diet   Eat a diet that includes plenty of vegetables, fruits, low-fat dairy products, and lean protein.  Do not eat a lot of foods that are high in solid fats, added sugars, or sodium. Maintain a healthy weight Body mass index (BMI) is used to identify weight problems. It estimates body fat based on height and weight. Your health care provider can help determine your BMI and help you achieve or maintain a healthy weight. Get regular exercise Get regular exercise. This is one of the most important things you can do for your health. Most adults should:  Exercise for at least 150 minutes each week. The exercise should increase your heart rate and make you sweat (moderate-intensity exercise).  Do strengthening exercises at least twice a week. This is in addition to the moderate-intensity exercise.  Spend less time sitting. Even light physical activity can be beneficial. Watch cholesterol and blood lipids Have your blood tested for lipids and cholesterol at 51 years of age, then have this test every 5 years. Have your cholesterol levels checked more often if:  Your lipid or cholesterol levels are high.  You are older than 51 years of age.  You are at high risk for heart disease. What should I know about cancer screening? Depending on your health history and family history, you may need to have cancer screening at various ages. This may include screening for:  Breast cancer.  Cervical cancer.  Colorectal cancer.  Skin cancer.  Lung cancer. What should I know about heart disease, diabetes, and high blood  pressure? Blood pressure and heart disease  High blood pressure causes heart disease and increases the risk of stroke. This is more likely to develop in people who have high blood pressure readings, are of African descent, or are overweight.  Have your blood pressure checked: ? Every 3-5 years if you are 18-39 years of age. ? Every year if you are 40 years old or older. Diabetes Have regular diabetes screenings. This checks your fasting blood sugar level. Have the screening done:  Once every three years after age 40 if you are at a normal weight and have a low risk for diabetes.  More often and at a younger age if you are overweight or have a high risk for diabetes. What should I know about preventing infection? Hepatitis B If you have a higher risk for hepatitis B, you should be screened for this virus. Talk with your health care provider to find out if you are at risk for hepatitis B infection. Hepatitis C Testing is recommended for:  Everyone born from 1945 through 1965.  Anyone with known risk factors for hepatitis C. Sexually transmitted infections (STIs)  Get screened for STIs, including gonorrhea and chlamydia, if: ? You are sexually active and are younger than 51 years of age. ? You are older than 51 years of age and your health care provider tells you that you are at risk for this type of infection. ? Your sexual activity has changed since you were last screened, and you are at increased risk for chlamydia or gonorrhea. Ask your health care provider if   you are at risk.  Ask your health care provider about whether you are at high risk for HIV. Your health care provider may recommend a prescription medicine to help prevent HIV infection. If you choose to take medicine to prevent HIV, you should first get tested for HIV. You should then be tested every 3 months for as long as you are taking the medicine. Pregnancy  If you are about to stop having your period (premenopausal) and  you may become pregnant, seek counseling before you get pregnant.  Take 400 to 800 micrograms (mcg) of folic acid every day if you become pregnant.  Ask for birth control (contraception) if you want to prevent pregnancy. Osteoporosis and menopause Osteoporosis is a disease in which the bones lose minerals and strength with aging. This can result in bone fractures. If you are 65 years old or older, or if you are at risk for osteoporosis and fractures, ask your health care provider if you should:  Be screened for bone loss.  Take a calcium or vitamin D supplement to lower your risk of fractures.  Be given hormone replacement therapy (HRT) to treat symptoms of menopause. Follow these instructions at home: Lifestyle  Do not use any products that contain nicotine or tobacco, such as cigarettes, e-cigarettes, and chewing tobacco. If you need help quitting, ask your health care provider.  Do not use street drugs.  Do not share needles.  Ask your health care provider for help if you need support or information about quitting drugs. Alcohol use  Do not drink alcohol if: ? Your health care provider tells you not to drink. ? You are pregnant, may be pregnant, or are planning to become pregnant.  If you drink alcohol: ? Limit how much you use to 0-1 drink a day. ? Limit intake if you are breastfeeding.  Be aware of how much alcohol is in your drink. In the [place] one drink equals one 12 oz bottle of beer (355 mL), one 5 oz glass of wine (148 mL), or one 1 oz glass of hard liquor (44 mL). General instructions  Schedule regular health, dental, and eye exams.  Stay current with your vaccines.  Tell your health care provider if: ? You often feel depressed. ? You have ever been abused or do not feel safe at home. Summary  Adopting a healthy lifestyle and getting preventive care are important in promoting health and wellness.  Follow your health care provider's instructions about healthy  diet, exercising, and getting tested or screened for diseases.  Follow your health care provider's instructions on monitoring your cholesterol and blood pressure. This information is not intended to replace advice given to you by your health care provider. Make sure you discuss any questions you have with your health care provider. Document Revised: 08/05/2018 Document Reviewed: 08/05/2018 Elsevier Patient Education  2020 Elsevier Inc.  

## 2020-07-26 LAB — SPECIMEN STATUS REPORT

## 2020-07-26 LAB — VITAMIN D 25 HYDROXY (VIT D DEFICIENCY, FRACTURES): Vit D, 25-Hydroxy: 31.4 ng/mL (ref 30.0–100.0)

## 2020-08-09 ENCOUNTER — Other Ambulatory Visit: Payer: Self-pay

## 2020-08-09 DIAGNOSIS — E785 Hyperlipidemia, unspecified: Secondary | ICD-10-CM

## 2020-08-09 MED ORDER — NEXLETOL 180 MG PO TABS: ORAL_TABLET | ORAL | 1 refills | Status: DC

## 2020-08-16 ENCOUNTER — Other Ambulatory Visit: Payer: Self-pay | Admitting: Nurse Practitioner

## 2020-08-16 DIAGNOSIS — E785 Hyperlipidemia, unspecified: Secondary | ICD-10-CM

## 2020-08-22 ENCOUNTER — Other Ambulatory Visit: Payer: Self-pay | Admitting: Nurse Practitioner

## 2020-08-22 DIAGNOSIS — R232 Flushing: Secondary | ICD-10-CM

## 2020-09-04 ENCOUNTER — Inpatient Hospital Stay (HOSPITAL_COMMUNITY): Admission: RE | Admit: 2020-09-04 | Payer: 59 | Source: Ambulatory Visit

## 2020-09-05 ENCOUNTER — Telehealth: Payer: Self-pay

## 2020-09-05 NOTE — Telephone Encounter (Signed)
Patient called and was notified that the PA for nexlotol was denied and she still needs to have the ultrasound on her legs done per JM. Patient stated she will give them a call. YL,RMA

## 2020-09-15 ENCOUNTER — Other Ambulatory Visit: Payer: Self-pay | Admitting: Nurse Practitioner

## 2020-09-15 ENCOUNTER — Ambulatory Visit: Payer: Self-pay | Admitting: Orthopaedic Surgery

## 2020-09-15 DIAGNOSIS — Z789 Other specified health status: Secondary | ICD-10-CM

## 2020-09-15 DIAGNOSIS — E785 Hyperlipidemia, unspecified: Secondary | ICD-10-CM

## 2020-09-22 ENCOUNTER — Ambulatory Visit: Payer: 59 | Admitting: Orthopaedic Surgery

## 2020-09-22 ENCOUNTER — Other Ambulatory Visit: Payer: Self-pay

## 2020-09-22 DIAGNOSIS — S83242A Other tear of medial meniscus, current injury, left knee, initial encounter: Secondary | ICD-10-CM | POA: Diagnosis not present

## 2020-09-22 NOTE — Progress Notes (Signed)
Office Visit Note   Patient: Yvonne [name]           Date of Birth: 1971-04-03           MRN: [id number] Visit Date: 09/22/2020              Requested by: Minette Brine, [address] [place],  [place] [address] PCP: Minette Brine, FNP   Assessment & Plan: Visit Diagnoses:  1. Acute medial meniscal tear, left, initial encounter     Plan: Impression is recurrent left medial meniscal tear.  I reviewed the MRI in detail with the patient today.  Given her symptoms I have recommended a repeat arthroscopic partial medial meniscectomy in the near future.  Risk benefits rehab recovery again reviewed with the patient.  All questions answered.  Would look forward to treating her in the operating room in the near future.  Follow-Up Instructions: Return if symptoms worsen or fail to improve.   Orders:  No orders of the defined types were placed in this encounter.  No orders of the defined types were placed in this encounter.     Procedures: No procedures performed   Clinical Data: No additional findings.   Subjective: Chief Complaint  Patient presents with  . Left Knee - Pain    Yvonne [name] is a 50 year old female referral from Dr. Demetrius Revel for recurrent left medial meniscal tear.  She underwent partial medial meniscectomy about 4 months ago and did well but unfortunately suffered a recurrent tear.  She cannot recall a specific injury.  She has had an aspiration and cortisone injection since the surgery which helped temporarily.  She has had recurrence of her symptoms in terms of pain and catching and locking and joint swelling.  She is unable to walk without pain.  She was referred to my office due to a change in her health insurance   Review of Systems  Constitutional: Negative.   HENT: Negative.   Eyes: Negative.   Respiratory: Negative.   Cardiovascular: Negative.   Endocrine: Negative.   Musculoskeletal: Negative.   Neurological: Negative.   Hematological:  Negative.   Psychiatric/Behavioral: Negative.   All other systems reviewed and are negative.    Objective: Vital Signs: LMP 05/25/2012   Physical Exam Vitals and nursing note reviewed.  Constitutional:      Appearance: She is well-developed and well-nourished.  HENT:     Head: Normocephalic and atraumatic.  Eyes:     Extraocular Movements: EOM normal.  Pulmonary:     Effort: Pulmonary effort is normal.  Abdominal:     Palpations: Abdomen is soft.  Musculoskeletal:     Cervical back: Neck supple.  Skin:    General: Skin is warm.     Capillary Refill: Capillary refill takes less than 2 seconds.  Neurological:     Mental Status: She is alert and oriented to person, place, and time.  Psychiatric:        Mood and Affect: Mood and affect normal.        Behavior: Behavior normal.        Thought Content: Thought content normal.        Judgment: Judgment normal.     Ortho Exam Left knee shows fully healed surgical scars.  She does have a moderate joint effusion.  Medial joint tenderness with positive McMurray sign.  Range of motion is relatively well-preserved.  Collaterals and cruciates are stable.  Specialty Comments:  No specialty comments available.  Imaging: No  results found.   PMFS History: Patient Active Problem List   Diagnosis Date Noted  . Acute medial meniscal tear, left, initial encounter 09/22/2020  . Constipation 06/21/2019  . Mixed hyperlipidemia 12/15/2018  . Depression 12/15/2018  . S/P laparoscopic hysterectomy - 06/15/12 06/23/2012   Past Medical History:  Diagnosis Date  . Allergy   . Depression   . Fibroid 2005  . H/O menorrhagia 01/16/11  . Herpes 2009  . HPV in female   . Hx: UTI (urinary tract infection) 05/06/11  . Hyperlipidemia   . PONV (postoperative nausea and vomiting)   . Vulvitis 02/04/11  . Yeast infection 06/19/2010    Family History  Problem Relation Age of Onset  . Hypertension Mother   . Heart disease Mother   .  Diabetes Father   . Alcohol abuse Father   . Diabetes Brother   . Hypertension Brother   . Alcohol abuse Brother   . Heart attack Maternal Grandfather   . Stomach cancer Maternal Grandfather   . Hypertension Brother   . Crohn's disease Other        mat 2nd cousin  . Irritable bowel syndrome Other        mat. nephew  . Colon cancer Neg Hx   . Breast cancer Neg Hx   . Colon polyps Neg Hx   . Esophageal cancer Neg Hx   . Rectal cancer Neg Hx     Past Surgical History:  Procedure Laterality Date  . CESAREAN SECTION  2008 & 2009  . COLONOSCOPY  11/24/2019  . CRYOTHERAPY  2009  . CYSTOSCOPY  06/15/2012   Procedure: CYSTOSCOPY;  Surgeon: Delice Lesch, MD;  Location: [facility];  Service: Gynecology;  Laterality: N/A;  . KNEE ARTHROSCOPY Left 05/16/2020   Procedure: LEFT KNEE ARTHROSCOPY, PARTIAL MENISCECTOMY;  Surgeon: Melrose Nakayama, MD;  Location: [facility];  Service: Orthopedics;  Laterality: Left;  . LAPAROSCOPIC HYSTERECTOMY  06/15/2012   Procedure: HYSTERECTOMY TOTAL LAPAROSCOPIC;  Surgeon: Delice Lesch, MD;  Location: [facility];  Service: Gynecology;  Laterality: N/A;  . MYOMECTOMY  2005  . TUBAL LIGATION     bilateral   Social History   Occupational History  . Occupation: homemaker    Employer: [employer]  Tobacco Use  . Smoking status: Never Smoker  . Smokeless tobacco: Never Used  Vaping Use  . Vaping Use: Never used  Substance and Sexual Activity  . Alcohol use: Yes    Alcohol/week: 6.0 standard drinks    Types: 6 Glasses of wine per week  . Drug use: No  . Sexual activity: Yes    Birth control/protection: Surgical    Comment: BTL

## 2020-09-25 ENCOUNTER — Telehealth: Payer: Self-pay

## 2020-09-25 NOTE — Telephone Encounter (Signed)
Yvonne [name] from [facility] called regarding an appeal for patient's nexletol stating she has sent over the appeal for [name] to fill out and fax back. [phone]  I returned her call and cancelled the order per provider request. Tyler Deis

## 2020-09-29 ENCOUNTER — Ambulatory Visit: Payer: 59 | Admitting: Cardiovascular Disease

## 2020-10-02 ENCOUNTER — Other Ambulatory Visit: Payer: Self-pay

## 2020-10-02 ENCOUNTER — Ambulatory Visit (INDEPENDENT_AMBULATORY_CARE_PROVIDER_SITE_OTHER): Payer: 59 | Admitting: Internal Medicine

## 2020-10-02 VITALS — BP 122/82 | HR 82 | Ht 65.0 in | Wt 177.0 lb

## 2020-10-02 DIAGNOSIS — E782 Mixed hyperlipidemia: Secondary | ICD-10-CM | POA: Diagnosis not present

## 2020-10-02 DIAGNOSIS — R7303 Prediabetes: Secondary | ICD-10-CM | POA: Diagnosis not present

## 2020-10-02 DIAGNOSIS — Z7189 Other specified counseling: Secondary | ICD-10-CM

## 2020-10-02 NOTE — Patient Instructions (Addendum)
Medication Instructions:  Your physician recommends that you continue on your current medications as directed. Please refer to the Current Medication list given to you today.  ** any changes will be based on your calcium score results  *If you need a refill on your cardiac medications before your next appointment, please call your pharmacy*   Lab Work: FASTING lipid panel - complete about 1 week before your next visit with Dr. Debara Pickett   If you have labs (blood work) drawn today and your tests are completely normal, you will receive your results only by: Marland Kitchen MyChart Message (if you have MyChart) OR . A paper copy in the mail If you have any lab test that is abnormal or we need to change your treatment, we will call you to review the results.   Testing/Procedures: Dr. Debara Pickett has ordered a CT coronary calcium score. This test is done at [address]. This is $99 out of pocket.   Coronary CalciumScan A coronary calcium scan is an imaging test used to look for deposits of calcium and other fatty materials (plaques) in the inner lining of the blood vessels of the heart (coronary arteries). These deposits of calcium and plaques can partly clog and narrow the coronary arteries without producing any symptoms or warning signs. This puts a person at risk for a heart attack. This test can detect these deposits before symptoms develop. Tell a health care provider about:  Any allergies you have.  All medicines you are taking, including vitamins, herbs, eye drops, creams, and over-the-counter medicines.  Any problems you or family members have had with anesthetic medicines.  Any blood disorders you have.  Any surgeries you have had.  Any medical conditions you have.  Whether you are pregnant or may be pregnant. What are the risks? Generally, this is a safe procedure. However, problems may occur, including:  Harm to a pregnant woman and her unborn baby. This test involves the  use of radiation. Radiation exposure can be dangerous to a pregnant woman and her unborn baby. If you are pregnant, you generally should not have this procedure done.  Slight increase in the risk of cancer. This is because of the radiation involved in the test. What happens before the procedure? No preparation is needed for this procedure. What happens during the procedure?  You will undress and remove any jewelry around your neck or chest.  You will put on a hospital gown.  Sticky electrodes will be placed on your chest. The electrodes will be connected to an electrocardiogram (ECG) machine to record a tracing of the electrical activity of your heart.  A CT scanner will take pictures of your heart. During this time, you will be asked to lie still and hold your breath for 2-3 seconds while a picture of your heart is being taken. The procedure may vary among health care providers and hospitals. What happens after the procedure?  You can get dressed.  You can return to your normal activities.  It is up to you to get the results of your test. Ask your health care provider, or the department that is doing the test, when your results will be ready. Summary  A coronary calcium scan is an imaging test used to look for deposits of calcium and other fatty materials (plaques) in the inner lining of the blood vessels of the heart (coronary arteries).  Generally, this is a safe procedure. Tell your health care provider if you are pregnant or  may be pregnant.  No preparation is needed for this procedure.  A CT scanner will take pictures of your heart.  You can return to your normal activities after the scan is done. This information is not intended to replace advice given to you by your health care provider. Make sure you discuss any questions you have with your health care provider. Document Released: 02/08/2008 Document Revised: 07/01/2016 Document Reviewed: 07/01/2016 Elsevier Interactive  Patient Education  2017 [place]: At [facility], you and your health needs are our priority.  As part of our continuing mission to provide you with exceptional heart care, we have created designated Provider Care Teams.  These Care Teams include your primary Cardiologist (physician) and Advanced Practice Providers (APPs -  Physician Assistants and Nurse Practitioners) who all work together to provide you with the care you need, when you need it.  We recommend signing up for the patient portal called "MyChart".  Sign up information is provided on this After Visit Summary.  MyChart is used to connect with patients for Virtual Visits (Telemedicine).  Patients are able to view lab/test results, encounter notes, upcoming appointments, etc.  Non-urgent messages can be sent to your provider as well.   To learn more about what you can do with MyChart, go to [email or web address].    Your next appointment:   4 month(s) - lipid clinic  The format for your next appointment:   In Person or Virtual  Provider:   Raliegh Ip Mali Hilty, MD   Other Instructions  You have been referred to Jearld Fenton ([place], Certified Diabetes Educator, Nutritionist) [facility] at [place]. [address], [place] [address] [phone]

## 2020-10-02 NOTE — Progress Notes (Signed)
LIPID CLINIC CONSULT NOTE  Chief Complaint:  Dyslipidemia  Primary Care Physician: Minette Brine, [place]  Primary Cardiologist:  No primary care provider on file.  HPI:  Yvonne [name] is a 50 y.o. female who is being seen today for the evaluation of dyslipidemia at the request of Minette Brine, [place].  This is a pleasant 50 year old female kindly referred for evaluation and management of dyslipidemia.  She reports history of elevated cholesterol however more recently her cholesterol has trended upwards.  This is also notable to be associated with weight gain, less activity due to struggles with left knee pain (she has an upcoming repeat knee surgery) and an atherogenic diet.  She reports a family history of heart disease including her maternal grandfather had an MI in his 19s and her mother who had high cholesterol and is of heart care patient.  Also more recently she tried a ketogenic diet.  This seems to correlate with her increasing cholesterol particularly her LDL.  Her most recent lipid profile showed total cholesterol 294, triglycerides 85, HDL 78 and LDL 202.  Hemoglobin A1c was 6.1.  She is not on treatment for diabetes or any medications for that.  PMHx:  Past Medical History:  Diagnosis Date  . Allergy   . Depression   . Fibroid 2005  . H/O menorrhagia 01/16/11  . Herpes 2009  . HPV in female   . Hx: UTI (urinary tract infection) 05/06/11  . Hyperlipidemia   . PONV (postoperative nausea and vomiting)   . Vulvitis 02/04/11  . Yeast infection 06/19/2010    Past Surgical History:  Procedure Laterality Date  . CESAREAN SECTION  2008 & 2009  . COLONOSCOPY  11/24/2019  . CRYOTHERAPY  2009  . CYSTOSCOPY  06/15/2012   Procedure: CYSTOSCOPY;  Surgeon: Delice Lesch, MD;  Location: [facility];  Service: Gynecology;  Laterality: N/A;  . KNEE ARTHROSCOPY Left 05/16/2020   Procedure: LEFT KNEE ARTHROSCOPY, PARTIAL MENISCECTOMY;  Surgeon: Melrose Nakayama, MD;  Location: [facility];   Service: Orthopedics;  Laterality: Left;  . LAPAROSCOPIC HYSTERECTOMY  06/15/2012   Procedure: HYSTERECTOMY TOTAL LAPAROSCOPIC;  Surgeon: Delice Lesch, MD;  Location: [facility];  Service: Gynecology;  Laterality: N/A;  . MYOMECTOMY  2005  . TUBAL LIGATION     bilateral    FAMHx:  Family History  Problem Relation Age of Onset  . Hypertension Mother   . Heart disease Mother   . Diabetes Father   . Alcohol abuse Father   . Diabetes Brother   . Hypertension Brother   . Alcohol abuse Brother   . Heart attack Maternal Grandfather   . Stomach cancer Maternal Grandfather   . Hypertension Brother   . Crohn's disease Other        mat 2nd cousin  . Irritable bowel syndrome Other        mat. nephew  . Colon cancer Neg Hx   . Breast cancer Neg Hx   . Colon polyps Neg Hx   . Esophageal cancer Neg Hx   . Rectal cancer Neg Hx     SOCHx:   reports that she has never smoked. She has never used smokeless tobacco. She reports current alcohol use of about 6.0 standard drinks of alcohol per week. She reports that she does not use drugs.  ALLERGIES:  Allergies  Allergen Reactions  . Sulfa Antibiotics Itching    ROS: Pertinent items noted in HPI and remainder of comprehensive ROS otherwise negative.  HOME MEDS:  Current Outpatient Medications on File Prior to Visit  Medication Sig Dispense Refill  . Adapalene 0.3 % gel Apply 1 application topically as needed (zits).     . Cyanocobalamin (VITAMIN B 12 PO) Take 1 tablet by mouth daily.     Marland Kitchen ibuprofen (ADVIL) 800 MG tablet Take 800 mg by mouth every 8 (eight) hours as needed.    Marland Kitchen ipratropium (ATROVENT) 0.03 % nasal spray PLACE 1 SPRAY INTO BOTH NOSTRILS DAILY (Patient taking differently: Place 2 sprays into both nostrils daily.) 30 mL 1  . MAGNESIUM PO Take 1 tablet by mouth daily.     . Multiple Vitamins-Minerals (CENTRUM ADULTS PO) Take 1 tablet by mouth daily.     . Omega-3 Fatty Acids (FISH OIL PO) Take 1 capsule by mouth daily.      Marland Kitchen PARoxetine (PAXIL) 20 MG tablet TAKE 1 TABLET BY MOUTH EVERY DAY (Patient taking differently: Take 10 mg by mouth daily.) 90 tablet 1  . valACYclovir (VALTREX) 1000 MG tablet TAKE 1 TABLET BY MOUTH EVERY 12 HOURS (Patient taking differently: Take 1,000 mg by mouth every 12 (twelve) hours as needed (break out).) 180 tablet 1  . VITAMIN D PO Take 1 capsule by mouth daily.     No current facility-administered medications on file prior to visit.    LABS/IMAGING: No results found for this or any previous visit (from the past 48 hour(s)). No results found.  LIPID PANEL:    Component Value Date/Time   CHOL 294 (H) 07/18/2020 1522   TRIG 85 07/18/2020 1522   HDL 78 07/18/2020 1522   CHOLHDL 3.8 07/18/2020 1522   LDLCALC 202 (H) 07/18/2020 1522    WEIGHTS: Wt Readings from Last 3 Encounters:  10/02/20 177 lb (80.3 kg)  07/19/20 169 lb 12.8 oz (77 kg)  05/16/20 167 lb 3.2 oz (75.8 kg)    VITALS: BP 122/82 (BP Location: Left Arm, Patient Position: Sitting, Cuff Size: Normal)   Pulse 82   Ht 5\' 5"  (1.651 m)   Wt 177 lb (80.3 kg)   LMP 05/25/2012   BMI 29.45 kg/m   EXAM: General appearance: alert and no distress Neck: no carotid bruit, no JVD and thyroid not enlarged, symmetric, no tenderness/mass/nodules Lungs: clear to auscultation bilaterally Heart: regular rate and rhythm, S1, S2 normal, no murmur, click, rub or gallop Abdomen: soft, non-tender; bowel sounds normal; no masses,  no organomegaly Extremities: extremities normal, atraumatic, no cyanosis or edema Pulses: 2+ and symmetric Skin: Skin color, texture, turgor normal. No rashes or lesions Neurologic: Grossly normal Psych: Pleasant  *Examination chaperoned by Sheral Apley, RN.  EKG: Deferred  ASSESSMENT: 1. Mixed dyslipidemia 2. Family history of coronary disease  PLAN: 1.   Ms. Meenan has a mixed dyslipidemia and a family history of coronary disease.  It does seem like she has had much lower cholesterol in  the past and more recently had tried a ketogenic diet, has had 10 pound weight gain and has been more sedentary suggesting that a lot of her high cholesterol may be dietary.  Although LDL is greater than 190, I am suspect she has a familial hyperlipidemia, however her family history does demonstrate some heart disease more remotely.  I would like first to work on aggressive diet and lifestyle modifications.  Hopefully with her upcoming knee surgery she will be able to become more active and lose weight.  She is ready altered her diet.  She is interested in a nutrition referral which we will place.  I would also recommend a coronary calcium score.  Based on this we can further tailor her therapy.  She had previously tried some statins with side effects although briefly.  She was also trialed on a sample of bempedoic acid which she tolerated well however I think it may be difficult to get covered with insurance and we would likely need to try another statin first.  Follow-up with me afterwards.  Thanks again for the kind referral.  Pixie Casino, MD, FACC, [place] Director of the [facility]  Card[facility] Diplomate of the American Board of Clinical Lipidology Attending Cardiologist  Direct Dial: [phone]  Fax: [phone]  Website:  [email or web address] [email or web address] 10/02/2020, 4:24 PM

## 2020-10-05 ENCOUNTER — Other Ambulatory Visit: Payer: Self-pay

## 2020-10-06 ENCOUNTER — Other Ambulatory Visit: Payer: Self-pay

## 2020-10-06 ENCOUNTER — Encounter (HOSPITAL_BASED_OUTPATIENT_CLINIC_OR_DEPARTMENT_OTHER): Payer: Self-pay | Admitting: Orthopaedic Surgery

## 2020-10-10 ENCOUNTER — Other Ambulatory Visit (HOSPITAL_COMMUNITY)
Admission: RE | Admit: 2020-10-10 | Discharge: 2020-10-10 | Disposition: A | Discharge status: Home / Self Care | Payer: 59 | Source: Ambulatory Visit | Attending: Orthopaedic Surgery | Admitting: Orthopaedic Surgery

## 2020-10-10 DIAGNOSIS — Z20822 Contact with and (suspected) exposure to covid-19: Secondary | ICD-10-CM | POA: Insufficient documentation

## 2020-10-10 DIAGNOSIS — Z01812 Encounter for preprocedural laboratory examination: Secondary | ICD-10-CM | POA: Insufficient documentation

## 2020-10-10 LAB — SARS CORONAVIRUS 2 (TAT 6-24 HRS): SARS Coronavirus 2: NEGATIVE

## 2020-10-13 ENCOUNTER — Ambulatory Visit (HOSPITAL_BASED_OUTPATIENT_CLINIC_OR_DEPARTMENT_OTHER)
Admission: RE | Admit: 2020-10-13 | Discharge: 2020-10-13 | Disposition: A | Discharge status: Home / Self Care | Payer: 59 | Attending: Orthopaedic Surgery | Admitting: Orthopaedic Surgery

## 2020-10-13 ENCOUNTER — Other Ambulatory Visit: Payer: Self-pay

## 2020-10-13 ENCOUNTER — Ambulatory Visit (HOSPITAL_BASED_OUTPATIENT_CLINIC_OR_DEPARTMENT_OTHER): Payer: 59 | Admitting: Certified Registered"

## 2020-10-13 ENCOUNTER — Encounter: Payer: Self-pay | Admitting: Orthopaedic Surgery

## 2020-10-13 ENCOUNTER — Encounter (HOSPITAL_BASED_OUTPATIENT_CLINIC_OR_DEPARTMENT_OTHER): Payer: Self-pay | Admitting: Orthopaedic Surgery

## 2020-10-13 ENCOUNTER — Encounter (HOSPITAL_BASED_OUTPATIENT_CLINIC_OR_DEPARTMENT_OTHER)
Admission: RE | Disposition: A | Discharge status: Home / Self Care | Payer: Self-pay | Source: Home / Self Care | Attending: Orthopaedic Surgery

## 2020-10-13 DIAGNOSIS — S83242A Other tear of medial meniscus, current injury, left knee, initial encounter: Secondary | ICD-10-CM | POA: Diagnosis present

## 2020-10-13 DIAGNOSIS — M659 Synovitis and tenosynovitis, unspecified: Secondary | ICD-10-CM | POA: Insufficient documentation

## 2020-10-13 DIAGNOSIS — Z90710 Acquired absence of both cervix and uterus: Secondary | ICD-10-CM | POA: Insufficient documentation

## 2020-10-13 DIAGNOSIS — E785 Hyperlipidemia, unspecified: Secondary | ICD-10-CM | POA: Diagnosis not present

## 2020-10-13 DIAGNOSIS — Z882 Allergy status to sulfonamides status: Secondary | ICD-10-CM | POA: Insufficient documentation

## 2020-10-13 DIAGNOSIS — M94262 Chondromalacia, left knee: Secondary | ICD-10-CM

## 2020-10-13 DIAGNOSIS — X58XXXA Exposure to other specified factors, initial encounter: Secondary | ICD-10-CM | POA: Diagnosis not present

## 2020-10-13 DIAGNOSIS — Z8619 Personal history of other infectious and parasitic diseases: Secondary | ICD-10-CM | POA: Insufficient documentation

## 2020-10-13 DIAGNOSIS — M2242 Chondromalacia patellae, left knee: Secondary | ICD-10-CM | POA: Diagnosis not present

## 2020-10-13 DIAGNOSIS — M65969 Unspecified synovitis and tenosynovitis, unspecified lower leg: Secondary | ICD-10-CM

## 2020-10-13 HISTORY — PX: KNEE ARTHROSCOPY WITH MEDIAL MENISECTOMY: SHX5651

## 2020-10-13 SURGERY — ARTHROSCOPY, KNEE, WITH MEDIAL MENISCECTOMY
Anesthesia: General | Site: Knee | Laterality: Left

## 2020-10-13 MED ORDER — KETOROLAC TROMETHAMINE 30 MG/ML IJ SOLN
INTRAMUSCULAR | Status: DC | PRN
  Administered 2020-10-13: 30 mg via INTRAVENOUS

## 2020-10-13 MED ORDER — DEXAMETHASONE SODIUM PHOSPHATE 4 MG/ML IJ SOLN
INTRAMUSCULAR | Status: DC | PRN
  Administered 2020-10-13: 10 mg via INTRAVENOUS

## 2020-10-13 MED ORDER — MEPERIDINE HCL 25 MG/ML IJ SOLN
6.2500 mg | INTRAMUSCULAR | Status: DC | PRN
Start: 2020-10-13 — End: 2020-10-13

## 2020-10-13 MED ORDER — LACTATED RINGERS IV SOLN: INTRAVENOUS | Status: DC

## 2020-10-13 MED ORDER — OXYCODONE HCL 5 MG/5ML PO SOLN
5.0000 mg | Freq: Once | ORAL | Status: DC | PRN
Start: 2020-10-13 — End: 2020-10-13

## 2020-10-13 MED ORDER — PROMETHAZINE HCL 25 MG/ML IJ SOLN
INTRAMUSCULAR | Status: AC
  Filled 2020-10-13: qty 1

## 2020-10-13 MED ORDER — HYDROMORPHONE HCL 1 MG/ML IJ SOLN
INTRAMUSCULAR | Status: AC
  Filled 2020-10-13: qty 0.5

## 2020-10-13 MED ORDER — BUPIVACAINE HCL (PF) 0.25 % IJ SOLN
INTRAMUSCULAR | Status: DC | PRN
  Administered 2020-10-13: 40 mL

## 2020-10-13 MED ORDER — OXYCODONE HCL 5 MG PO TABS
5.0000 mg | ORAL_TABLET | Freq: Once | ORAL | Status: DC | PRN
Start: 2020-10-13 — End: 2020-10-13

## 2020-10-13 MED ORDER — ONDANSETRON HCL 4 MG/2ML IJ SOLN
INTRAMUSCULAR | Status: DC | PRN
  Administered 2020-10-13: 4 mg via INTRAVENOUS

## 2020-10-13 MED ORDER — DROPERIDOL 2.5 MG/ML IJ SOLN
INTRAMUSCULAR | Status: DC | PRN
  Administered 2020-10-13: .625 mg via INTRAVENOUS

## 2020-10-13 MED ORDER — BUPIVACAINE HCL (PF) 0.25 % IJ SOLN
INTRAMUSCULAR | Status: AC
  Filled 2020-10-13: qty 30

## 2020-10-13 MED ORDER — FENTANYL CITRATE (PF) 100 MCG/2ML IJ SOLN
INTRAMUSCULAR | Status: AC
  Filled 2020-10-13: qty 2

## 2020-10-13 MED ORDER — ONDANSETRON HCL 4 MG/2ML IJ SOLN
INTRAMUSCULAR | Status: AC
  Filled 2020-10-13: qty 2

## 2020-10-13 MED ORDER — LIDOCAINE 2% (20 MG/ML) 5 ML SYRINGE
INTRAMUSCULAR | Status: DC | PRN
  Administered 2020-10-13: 60 mg via INTRAVENOUS

## 2020-10-13 MED ORDER — HYDROCODONE-ACETAMINOPHEN 7.5-325 MG PO TABS: 1.0000 | ORAL_TABLET | Freq: Three times a day (TID) | ORAL | 0 refills | Status: DC | PRN

## 2020-10-13 MED ORDER — MIDAZOLAM HCL 2 MG/2ML IJ SOLN
INTRAMUSCULAR | Status: AC
  Filled 2020-10-13: qty 2

## 2020-10-13 MED ORDER — PROMETHAZINE HCL 25 MG/ML IJ SOLN
6.2500 mg | INTRAMUSCULAR | Status: DC | PRN
  Administered 2020-10-13: 6.25 mg via INTRAVENOUS

## 2020-10-13 MED ORDER — MIDAZOLAM HCL 5 MG/5ML IJ SOLN
INTRAMUSCULAR | Status: DC | PRN
  Administered 2020-10-13: 2 mg via INTRAVENOUS

## 2020-10-13 MED ORDER — PROPOFOL 10 MG/ML IV BOLUS
INTRAVENOUS | Status: DC | PRN
  Administered 2020-10-13: 20 mg via INTRAVENOUS
  Administered 2020-10-13: 200 mg via INTRAVENOUS
  Administered 2020-10-13: 20 mg via INTRAVENOUS

## 2020-10-13 MED ORDER — KETOROLAC TROMETHAMINE 30 MG/ML IJ SOLN
INTRAMUSCULAR | Status: AC
  Filled 2020-10-13: qty 1

## 2020-10-13 MED ORDER — SODIUM CHLORIDE (PF) 0.9 % IJ SOLN
INTRAMUSCULAR | Status: AC
  Filled 2020-10-13: qty 10

## 2020-10-13 MED ORDER — FENTANYL CITRATE (PF) 100 MCG/2ML IJ SOLN
INTRAMUSCULAR | Status: DC | PRN
  Administered 2020-10-13: 50 ug via INTRAVENOUS
  Administered 2020-10-13: 25 ug via INTRAVENOUS
  Administered 2020-10-13 (×2): 50 ug via INTRAVENOUS

## 2020-10-13 MED ORDER — DEXAMETHASONE SODIUM PHOSPHATE 10 MG/ML IJ SOLN
INTRAMUSCULAR | Status: AC
  Filled 2020-10-13: qty 1

## 2020-10-13 MED ORDER — HYDROMORPHONE HCL 1 MG/ML IJ SOLN
0.2500 mg | INTRAMUSCULAR | Status: DC | PRN
  Administered 2020-10-13 (×2): 0.5 mg via INTRAVENOUS

## 2020-10-13 MED ORDER — CEFAZOLIN SODIUM-DEXTROSE 2-4 GM/100ML-% IV SOLN
2.0000 g | INTRAVENOUS | Status: AC
  Administered 2020-10-13: 2 g via INTRAVENOUS

## 2020-10-13 MED ORDER — PROPOFOL 10 MG/ML IV BOLUS
INTRAVENOUS | Status: AC
  Filled 2020-10-13: qty 20

## 2020-10-13 MED ORDER — CEFAZOLIN SODIUM-DEXTROSE 2-4 GM/100ML-% IV SOLN
INTRAVENOUS | Status: AC
  Filled 2020-10-13: qty 100

## 2020-10-13 MED ORDER — DROPERIDOL 2.5 MG/ML IJ SOLN
INTRAMUSCULAR | Status: AC
  Filled 2020-10-13: qty 2

## 2020-10-13 SURGICAL SUPPLY — 36 items
BANDAGE ESMARK 6X9 LF (GAUZE/BANDAGES/DRESSINGS) IMPLANT
BLADE EXCALIBUR 4.0X13 (MISCELLANEOUS) ×2 IMPLANT
BLADE SHAVER TORPEDO 4X13 (MISCELLANEOUS) IMPLANT
BNDG ELASTIC 6X5.8 VLCR STR LF (GAUZE/BANDAGES/DRESSINGS) ×4 IMPLANT
BNDG ESMARK 6X9 LF (GAUZE/BANDAGES/DRESSINGS)
BURR OVAL 8 FLU 4.0X13 (MISCELLANEOUS) ×2 IMPLANT
COOLER ICEMAN CLASSIC (MISCELLANEOUS) IMPLANT
COVER WAND RF STERILE (DRAPES) IMPLANT
CUFF TOURN SGL QUICK 34 (TOURNIQUET CUFF) ×1
CUFF TRNQT CYL 34X4.125X (TOURNIQUET CUFF) ×1 IMPLANT
CUTTER BONE 4.0MM X 13CM (MISCELLANEOUS) ×4 IMPLANT
DRAPE ARTHROSCOPY W/POUCH 90 (DRAPES) ×2 IMPLANT
DRAPE IMP U-DRAPE 54X76 (DRAPES) ×2 IMPLANT
DRAPE U-SHAPE 47X51 STRL (DRAPES) ×2 IMPLANT
DURAPREP 26ML APPLICATOR (WOUND CARE) ×2 IMPLANT
GAUZE SPONGE 4X4 12PLY STRL (GAUZE/BANDAGES/DRESSINGS) ×2 IMPLANT
GAUZE XEROFORM 1X8 LF (GAUZE/BANDAGES/DRESSINGS) ×2 IMPLANT
GLOVE SURG LTX SZ7 (GLOVE) ×2 IMPLANT
GLOVE SURG NEOP MICRO LF SZ7.5 (GLOVE) ×2 IMPLANT
GLOVE SURG PR MICRO ENCORE 7 (GLOVE) ×2 IMPLANT
GLOVE SURG SYN 7.5  E (GLOVE) ×1
GLOVE SURG SYN 7.5 E (GLOVE) ×1 IMPLANT
GLOVE SURG UNDER POLY LF SZ7 (GLOVE) ×4 IMPLANT
GOWN STRL REIN XL XLG (GOWN DISPOSABLE) ×4 IMPLANT
GOWN STRL REUS W/ TWL LRG LVL3 (GOWN DISPOSABLE) ×1 IMPLANT
GOWN STRL REUS W/ TWL XL LVL3 (GOWN DISPOSABLE) IMPLANT
GOWN STRL REUS W/TWL LRG LVL3 (GOWN DISPOSABLE) ×1
GOWN STRL REUS W/TWL XL LVL3 (GOWN DISPOSABLE)
MANIFOLD NEPTUNE II (INSTRUMENTS) ×2 IMPLANT
PACK ARTHROSCOPY DSU (CUSTOM PROCEDURE TRAY) ×2 IMPLANT
PACK BASIN DAY SURGERY FS (CUSTOM PROCEDURE TRAY) ×2 IMPLANT
PAD COLD SHLDR WRAP-ON (PAD) IMPLANT
SHEET MEDIUM DRAPE 40X70 STRL (DRAPES) ×2 IMPLANT
SUT ETHILON 3 0 PS 1 (SUTURE) ×2 IMPLANT
TOWEL GREEN STERILE FF (TOWEL DISPOSABLE) ×2 IMPLANT
TUBING ARTHROSCOPY IRRIG 16FT (MISCELLANEOUS) ×2 IMPLANT

## 2020-10-13 NOTE — Discharge Instructions (Signed)
Next dose of NSAID (Ibuprofen, Motrin, Aleve) can be given after 7:15PM.       Post Anesthesia Home Care Instructions  Activity: Get plenty of rest for the remainder of the day. A responsible individual must stay with you for 24 hours following the procedure.  For the next 24 hours, DO NOT: -Drive a car -Paediatric nurse -Drink alcoholic beverages -Take any medication unless instructed by your physician -Make any legal decisions or sign important papers.  Meals: Start with liquid foods such as gelatin or soup. Progress to regular foods as tolerated. Avoid greasy, spicy, heavy foods. If nausea and/or vomiting occur, drink only clear liquids until the nausea and/or vomiting subsides. Call your physician if vomiting continues.  Special Instructions/Symptoms: Your throat may feel dry or sore from the anesthesia or the breathing tube placed in your throat during surgery. If this causes discomfort, gargle with warm salt water. The discomfort should disappear within 24 hours.  If you had a scopolamine patch placed behind your ear for the management of post- operative nausea and/or vomiting:  1. The medication in the patch is effective for 72 hours, after which it should be removed.  Wrap patch in a tissue and discard in the trash. Wash hands thoroughly with soap and water. 2. You may remove the patch earlier than 72 hours if you experience unpleasant side effects which may include dry mouth, dizziness or visual disturbances. 3. Avoid touching the patch. Wash your hands with soap and water after contact with the patch.

## 2020-10-13 NOTE — Anesthesia Preprocedure Evaluation (Addendum)
Anesthesia Evaluation  Patient identified by MRN, date of birth, ID band Patient awake    Reviewed: Allergy & Precautions, H&P , NPO status , Patient's Chart, lab work & pertinent test results  History of Anesthesia Complications (+) PONV and history of anesthetic complications  Airway Mallampati: I  TM Distance: >3 FB Neck ROM: Full    Dental  (+) Implants, Dental Advisory Given, Teeth Intact   Pulmonary neg pulmonary ROS,    Pulmonary exam normal breath sounds clear to auscultation       Cardiovascular negative cardio ROS Normal cardiovascular exam Rhythm:Regular Rate:Normal     Neuro/Psych PSYCHIATRIC DISORDERS Depression negative neurological ROS     GI/Hepatic negative GI ROS, Neg liver ROS,   Endo/Other  negative endocrine ROS  Renal/GU negative Renal ROS     Musculoskeletal negative musculoskeletal ROS (+)   Abdominal   Peds  Hematology negative hematology ROS (+)   Anesthesia Other Findings   Reproductive/Obstetrics                             Anesthesia Physical  Anesthesia Plan  ASA: II  Anesthesia Plan: General   Post-op Pain Management:    Induction: Intravenous  PONV Risk Score and Plan: 4 or greater and Ondansetron, Dexamethasone, Midazolam, Treatment may vary due to age or medical condition and Droperidol  Airway Management Planned: LMA  Additional Equipment: None  Intra-op Plan:   Post-operative Plan:   Informed Consent: I have reviewed the patients History and Physical, chart, labs and discussed the procedure including the risks, benefits and alternatives for the proposed anesthesia with the patient or authorized representative who has indicated his/her understanding and acceptance.     Dental advisory given  Plan Discussed with: CRNA  Anesthesia Plan Comments:        Anesthesia Quick Evaluation

## 2020-10-13 NOTE — H&P (Signed)
PREOPERATIVE H&P  Chief Complaint: left knee medial meniscal tear  HPI: Yvonne [name] is a 50 y.o. female who presents for surgical treatment of left knee medial meniscal tear.  She denies any changes in medical history.  Past Medical History:  Diagnosis Date  . Allergy   . Depression   . Fibroid 2005  . H/O menorrhagia 01/16/11  . Herpes 2009  . HPV in female   . Hx: UTI (urinary tract infection) 05/06/11  . Hyperlipidemia   . PONV (postoperative nausea and vomiting)   . Vulvitis 02/04/11  . Yeast infection 06/19/2010   Past Surgical History:  Procedure Laterality Date  . ABDOMINAL HYSTERECTOMY    . CESAREAN SECTION  2008 & 2009  . COLONOSCOPY  11/24/2019  . CRYOTHERAPY  2009  . CYSTOSCOPY  06/15/2012   Procedure: CYSTOSCOPY;  Surgeon: Delice Lesch, MD;  Location: [facility];  Service: Gynecology;  Laterality: N/A;  . KNEE ARTHROSCOPY Left 05/16/2020   Procedure: LEFT KNEE ARTHROSCOPY, PARTIAL MENISCECTOMY;  Surgeon: Melrose Nakayama, MD;  Location: [facility];  Service: Orthopedics;  Laterality: Left;  . LAPAROSCOPIC HYSTERECTOMY  06/15/2012   Procedure: HYSTERECTOMY TOTAL LAPAROSCOPIC;  Surgeon: Delice Lesch, MD;  Location: [facility];  Service: Gynecology;  Laterality: N/A;  . MYOMECTOMY  2005  . TUBAL LIGATION     bilateral   Social History   Socioeconomic History  . Marital status: Divorced    Spouse name: Not on file  . Number of children: 2  . Years of education: Not on file  . Highest education level: Not on file  Occupational History  . Occupation: homemaker    Employer: [employer]  Tobacco Use  . Smoking status: Never Smoker  . Smokeless tobacco: Never Used  Vaping Use  . Vaping Use: Never used  Substance and Sexual Activity  . Alcohol use: Yes    Alcohol/week: 6.0 standard drinks    Types: 6 Glasses of wine per week    Comment: wine daily  . Drug use: No  . Sexual activity: Yes    Birth control/protection: Surgical    Comment: BTL  Other  Topics Concern  . Not on file  Social History Narrative  . Not on file   Social Determinants of Health   Financial Resource Strain: Not on file  Food Insecurity: Not on file  Transportation Needs: Not on file  Physical Activity: Not on file  Stress: Not on file  Social Connections: Not on file   Family History  Problem Relation Age of Onset  . Hypertension Mother   . Heart disease Mother   . Diabetes Father   . Alcohol abuse Father   . Diabetes Brother   . Hypertension Brother   . Alcohol abuse Brother   . Heart attack Maternal Grandfather   . Stomach cancer Maternal Grandfather   . Hypertension Brother   . Crohn's disease Other        mat 2nd cousin  . Irritable bowel syndrome Other        mat. nephew  . Colon cancer Neg Hx   . Breast cancer Neg Hx   . Colon polyps Neg Hx   . Esophageal cancer Neg Hx   . Rectal cancer Neg Hx    Allergies  Allergen Reactions  . Sulfa Antibiotics Itching   Prior to Admission medications   Medication Sig Start Date End Date Taking? Authorizing Provider  Adapalene 0.3 % gel Apply 1 application topically as needed (  zits).  08/31/19  Yes [provider]  Cyanocobalamin (VITAMIN B 12 PO) Take 1 tablet by mouth daily.    Yes [provider]  ibuprofen (ADVIL) 800 MG tablet Take 800 mg by mouth every 8 (eight) hours as needed.   Yes [provider]  ipratropium (ATROVENT) 0.03 % nasal spray PLACE 1 SPRAY INTO BOTH NOSTRILS DAILY Patient taking differently: Place 2 sprays into both nostrils daily. 05/03/20  Yes Minette Brine, FNP  MAGNESIUM PO Take 1 tablet by mouth daily.    Yes [provider]  Melatonin (MELATONIN EXTRA STRENGTH) 5 MG CHEW Chew by mouth.   Yes [provider]  Multiple Vitamins-Minerals (CENTRUM ADULTS PO) Take 1 tablet by mouth daily.    Yes [provider]  Omega-3 Fatty Acids (FISH OIL PO) Take 1 capsule by mouth daily.    Yes [provider]  PARoxetine  (PAXIL) 20 MG tablet TAKE 1 TABLET BY MOUTH EVERY DAY Patient taking differently: Take 10 mg by mouth daily. 08/22/20  Yes Ghumman, Ramandeep, NP  valACYclovir (VALTREX) 1000 MG tablet TAKE 1 TABLET BY MOUTH EVERY 12 HOURS Patient taking differently: Take 1,000 mg by mouth every 12 (twelve) hours as needed (break out). 01/18/20  Yes Minette Brine, FNP  VITAMIN D PO Take 1 capsule by mouth daily.   Yes [provider]     Positive ROS: All other systems have been reviewed and were otherwise negative with the exception of those mentioned in the HPI and as above.  Physical Exam: General: Alert, no acute distress Cardiovascular: No pedal edema Respiratory: No cyanosis, no use of accessory musculature GI: abdomen soft Skin: No lesions in the area of chief complaint Neurologic: Sensation intact distally Psychiatric: Patient is competent for consent with normal mood and affect Lymphatic: no lymphedema  MUSCULOSKELETAL: exam stable  Assessment: left knee medial meniscal tear  Plan: Plan for Procedure(s): LEFT KNEE ARTHROSCOPY WITH PARTIAL MEDIAL MENISCECTOMY  The risks benefits and alternatives were discussed with the patient including but not limited to the risks of nonoperative treatment, versus surgical intervention including infection, bleeding, nerve injury,  blood clots, cardiopulmonary complications, morbidity, mortality, among others, and they were willing to proceed.   Preoperative templating of the joint replacement has been completed, documented, and submitted to the Operating Room personnel in order to optimize intra-operative equipment management.   Eduard Roux, MD 10/13/2020 11:35 AM

## 2020-10-13 NOTE — Transfer of Care (Signed)
Immediate Anesthesia Transfer of Care Note  Patient: Yvonne [name]  Procedure(s) Performed: LEFT KNEE ARTHROSCOPY WITH PARTIAL MEDIAL MENISCECTOMY (Left Knee)  Patient Location: PACU  Anesthesia Type:General  Level of Consciousness: drowsy  Airway & Oxygen Therapy: Patient Spontanous Breathing and Patient connected to face mask oxygen  Post-op Assessment: Report given to RN and Post -op Vital signs reviewed and stable  Post vital signs: Reviewed and stable  Last Vitals:  Vitals Value Taken Time  BP    Temp 36.6 C 10/13/20 1329  Pulse 85 10/13/20 1329  Resp 13 10/13/20 1329  SpO2 100 % 10/13/20 1329    Last Pain:  Vitals:   10/13/20 1207  TempSrc: Oral  PainSc: 0-No pain      Patients Stated Pain Goal: 3 (11/05/79 1886)  Complications: No complications documented.

## 2020-10-13 NOTE — Anesthesia Procedure Notes (Signed)
Procedure Name: LMA Insertion Date/Time: 10/13/2020 12:47 PM Performed by: Ezequiel Kayser, CRNA Pre-anesthesia Checklist: Patient identified, Emergency Drugs available, Suction available and Patient being monitored Patient Re-evaluated:Patient Re-evaluated prior to induction Oxygen Delivery Method: Circle System Utilized Preoxygenation: Pre-oxygenation with 100% oxygen Induction Type: IV induction Ventilation: Mask ventilation without difficulty LMA: LMA inserted LMA Size: 4.0 Number of attempts: 1 Airway Equipment and Method: Bite block Placement Confirmation: positive ETCO2 Tube secured with: Tape Dental Injury: Teeth and Oropharynx as per pre-operative assessment  Comments: Eyes taped prior to insertion

## 2020-10-13 NOTE — Anesthesia Postprocedure Evaluation (Signed)
Anesthesia Post Note  Patient: Yvonne [name]  Procedure(s) Performed: LEFT KNEE ARTHROSCOPY WITH PARTIAL MEDIAL MENISCECTOMY (Left Knee)     Patient location during evaluation: PACU Anesthesia Type: General Level of consciousness: awake and alert Pain management: pain level controlled Vital Signs Assessment: post-procedure vital signs reviewed and stable Respiratory status: spontaneous breathing, nonlabored ventilation and respiratory function stable Cardiovascular status: blood pressure returned to baseline and stable Postop Assessment: no apparent nausea or vomiting Anesthetic complications: no   No complications documented.  Last Vitals:  Vitals:   10/13/20 1345 10/13/20 1400  BP: (!) 157/85 (!) 159/97  Pulse: 70 72  Resp: 15 17  Temp:    SpO2: 100% 100%    Last Pain:  Vitals:   10/13/20 1346  TempSrc:   PainSc: [place]

## 2020-10-13 NOTE — Op Note (Signed)
Surgery Date: 10/13/2020  PREOPERATIVE DIAGNOSES:  1. Retear of left knee medial meniscus tear 2. Left knee synovitis 3. 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 Left knee arthroscopy with major synovectomy 2. Left knee arthroscopy with arthroscopic partial medial meniscectomy 3. Left knee arthroscopy with arthroscopic abrasion arthroplasty medial femoral condyle and medial tibial plateau.  SURGEON: N. Eduard Roux, M.D.  ASSIST: Ciro Backer [employer], [place]; necessary for the timely completion of procedure and due to complexity of procedure.  ANESTHESIA:  general  FLUIDS: Per anesthesia record.   ESTIMATED BLOOD LOSS: minimal  DESCRIPTION OF PROCEDURE: Yvonne [name] is a 50 y.o.-year-old female with above mentioned conditions. Full discussion held regarding risks benefits alternatives and complications related surgical intervention. Conservative care options reviewed. All questions answered.  The patient was identified in the preoperative holding area and the operative extremity was marked. The patient was brought to the operating room and transferred to operating table in a supine position. Satisfactory general anesthesia was induced by anesthesiology.    Standard anterolateral, anteromedial arthroscopy portals were obtained. The anteromedial portal was obtained with a spinal needle for localization under direct visualization with subsequent diagnostic findings.   Diagnostic knee arthroscopy was first performed which revealed moderate synovitis in the medial and lateral compartments as well as the superior patellar pouch.  Major synovectomy was performed.  A valgus force was then placed on the knee to address the medial compartment.  There was significant grade III chondromalacia of the medial femoral condyle and medial tibial plateau with focal area of the medial femoral condyle measuring approximately a centimeter  in diameter of grade IV chondromalacia.  There was a corresponding area of the medial tibial plateau that measured approximately 8 mm in diameter of grade IV chondromalacia.  We also identified the recurrent tear of the medial meniscus.  Partial medial meniscectomy was performed back to a stable border.  The majority of the volume of the medial meniscus had to be resected.  The posterior horn was intact.  The anterior horn was unremarkable.  Decision was made to perform abrasion arthroplasty for the focal areas of grade IV chondromalacia especially due to the lack of a functional medial meniscus in that area.  The pressure was turned down and there was good punctate bleeding from the subchondral bone.   The cruciates were unremarkable.  The leg was placed in figure 4 position in the lateral compartment demonstrated some very mild chondromalacia and chondral fraying of the lateral femoral condyle.  There was some fraying of the lateral meniscus.  Knee was then placed into full extension in the patellofemoral compartment demonstrated grade I chondromalacia.  Gutters were checked for loose bodies.  Excess fluid was removed from the knee joint.  Incisions were closed with interrupted nylon sutures.  Sterile dressings were applied.  Patient tolerated procedure well had no immediate complications.  Suprapatellar pouch and gutters: moderate synovitis or debris. Patella chondral surface: Grade 1 Trochlear chondral surface: Grade 1 Patellofemoral tracking: normal Medial meniscus: recurrent mid body tear.  Medial femoral condyle weight bearing surface: Grade 3-4 Medial tibial plateau: Grade 3-4 Anterior cruciate ligament:stable Posterior cruciate ligament:stable Lateral meniscus: normal.   Lateral femoral condyle weight bearing surface: Grade 1 Lateral tibial plateau: Grade 1  DISPOSITION: The patient was awakened from general anesthetic, extubated, taken to the recovery room in medically stable condition, no  apparent complications. The patient may be weightbearing as tolerated to the operative lower  extremity.  Range of motion of right knee as tolerated.  [name], MD [facility] 1:15 PM

## 2020-10-14 IMAGING — CR LEFT KNEE - COMPLETE 4+ VIEW
4 series · 4 of 4 positions shown · non-contrast
Comparison: None.

CLINICAL DATA: Left knee pain and swelling.

EXAM:
LEFT KNEE - COMPLETE 4+ VIEW

[w knee ap left]
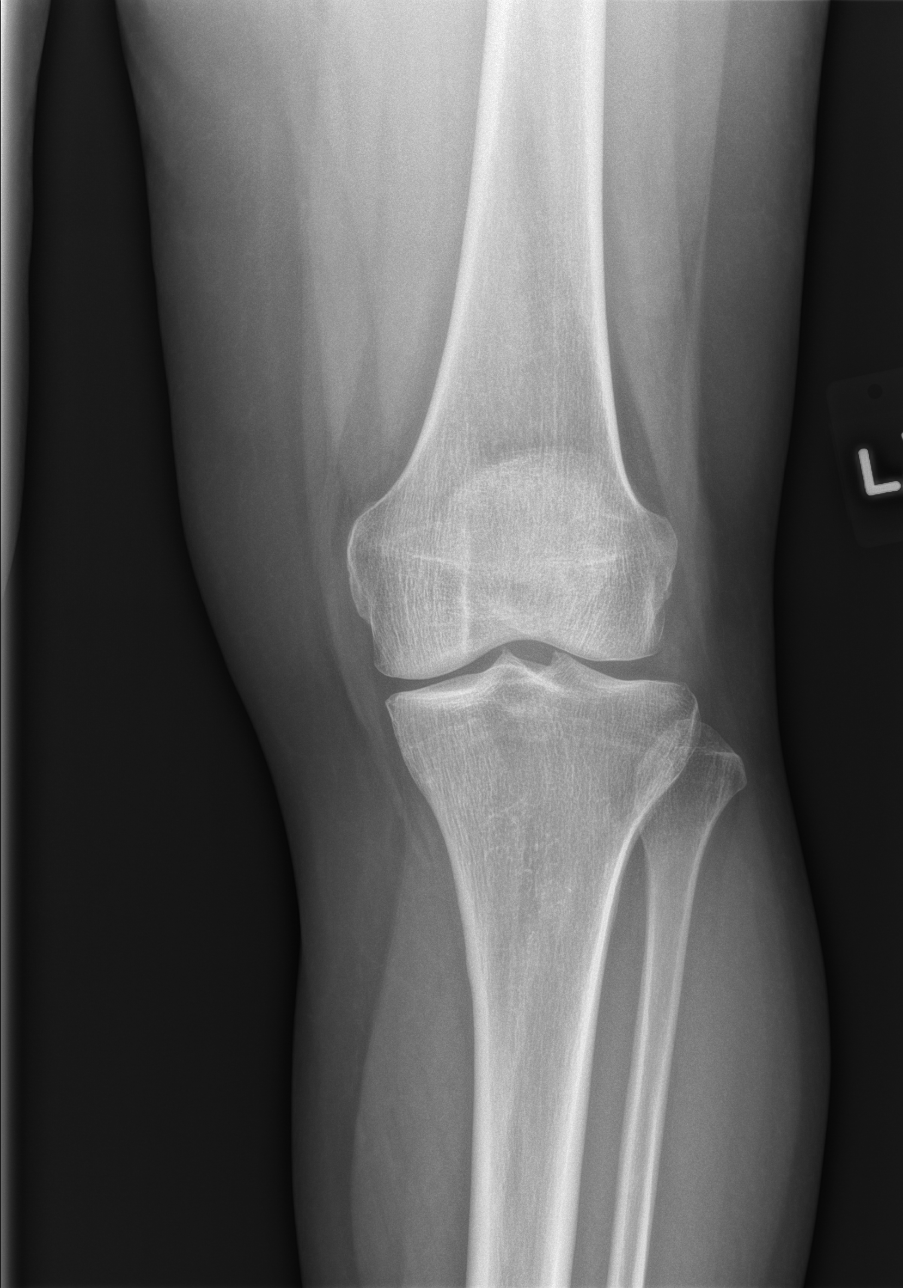

[w knee lat left (1 of 2)]
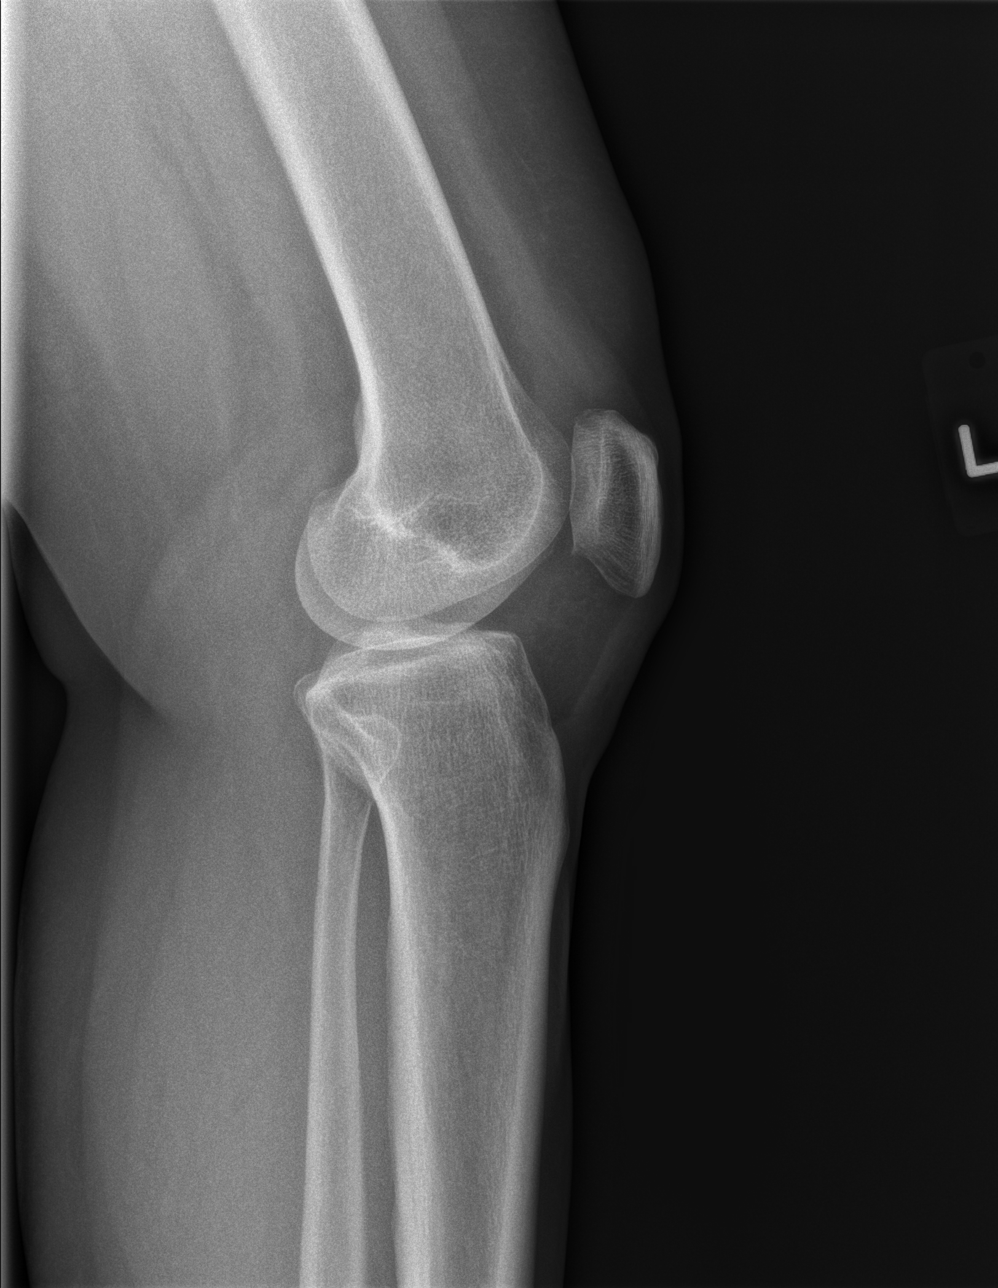

[w knee lat left (2 of 2)]
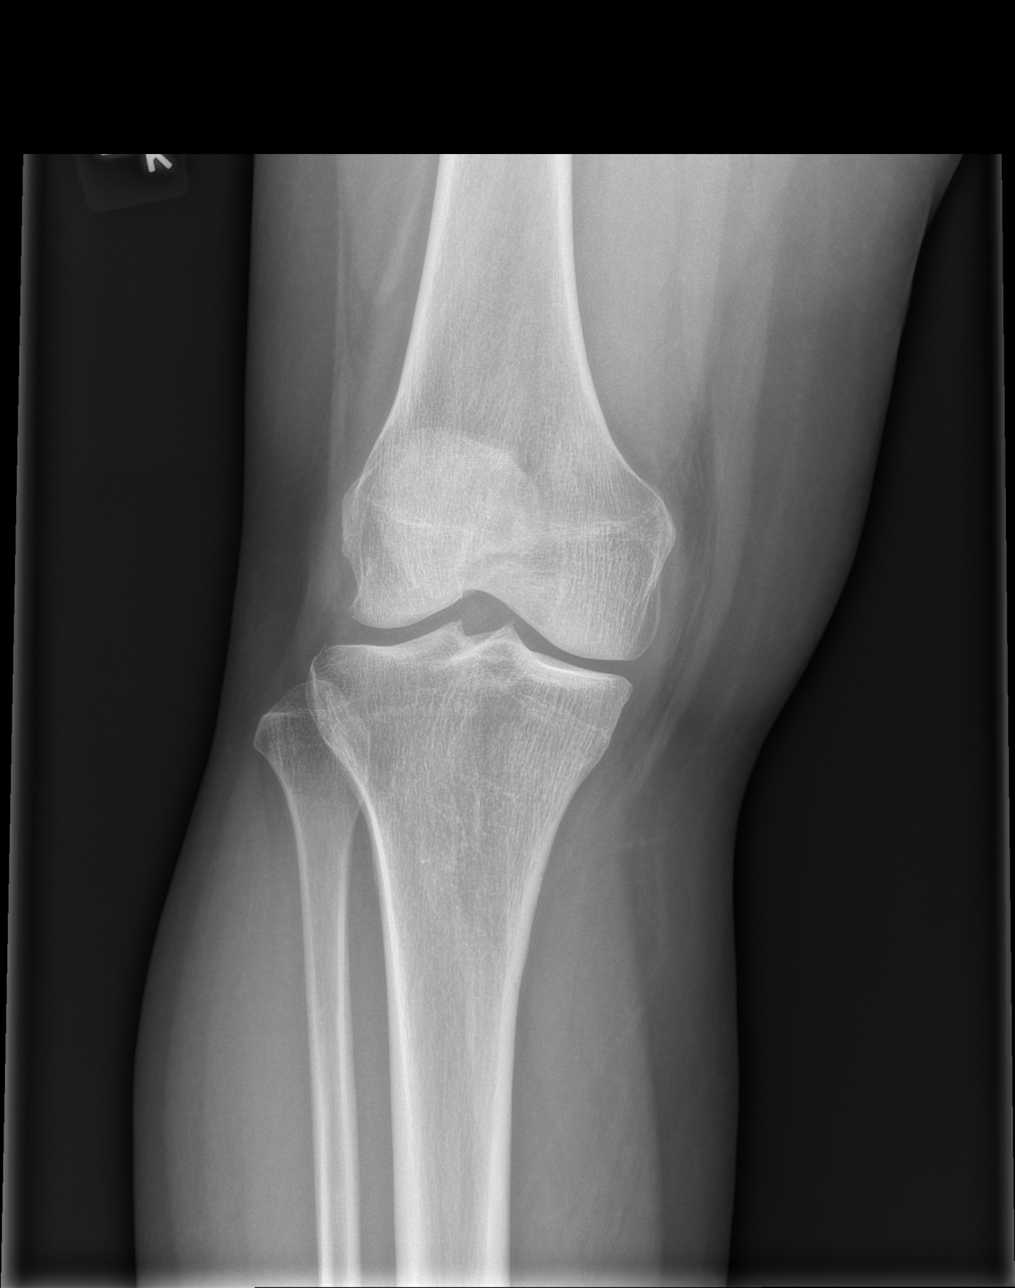

[x knee sunrise left]
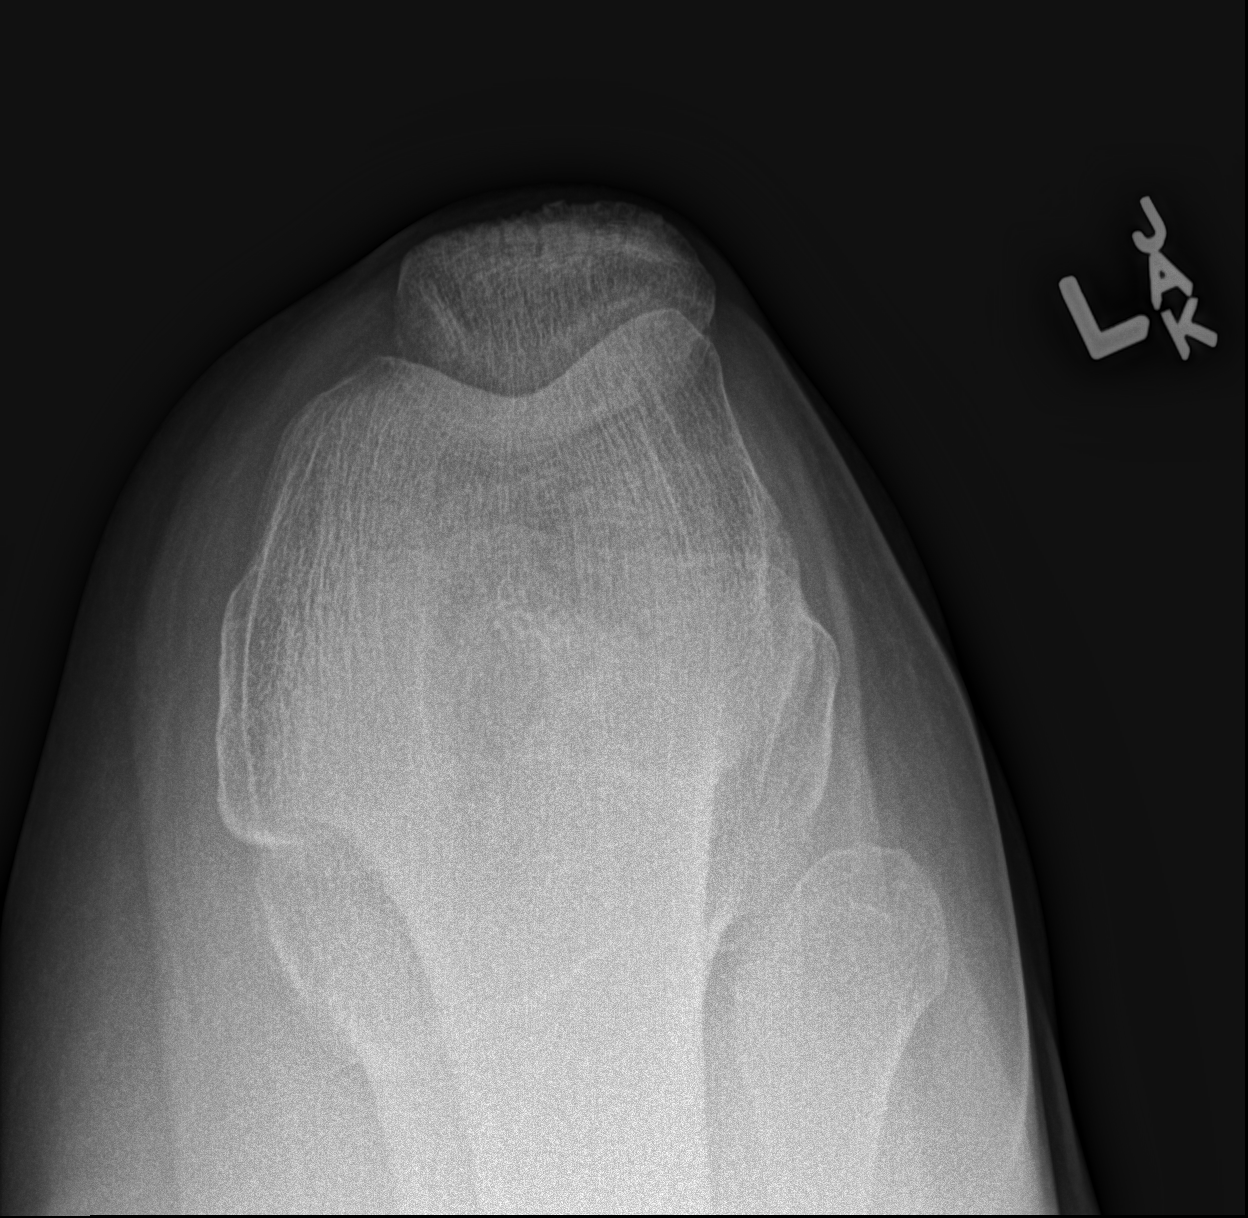

[4 of 4 positions shown; findings below may reference images not displayed]

FINDINGS: There is no acute displaced fracture or dislocation. The joint
spaces are relatively well preserved. There is a small joint
effusion.
IMPRESSION: 1. No acute displaced fracture or dislocation.
2. Small suprapatellar joint effusion.
3. The joint spaces are relatively well preserved.

## 2020-10-16 ENCOUNTER — Encounter (HOSPITAL_BASED_OUTPATIENT_CLINIC_OR_DEPARTMENT_OTHER): Payer: Self-pay | Admitting: Orthopaedic Surgery

## 2020-10-17 ENCOUNTER — Ambulatory Visit (INDEPENDENT_AMBULATORY_CARE_PROVIDER_SITE_OTHER): Payer: 59 | Admitting: Orthopaedic Surgery

## 2020-10-17 ENCOUNTER — Encounter: Payer: Self-pay | Admitting: Orthopaedic Surgery

## 2020-10-17 VITALS — Ht 65.0 in | Wt 171.0 lb

## 2020-10-17 DIAGNOSIS — Z9889 Other specified postprocedural states: Secondary | ICD-10-CM

## 2020-10-17 DIAGNOSIS — M1712 Unilateral primary osteoarthritis, left knee: Secondary | ICD-10-CM

## 2020-10-17 MED ORDER — LIDOCAINE HCL 1 % IJ SOLN
2.0000 mL | INTRAMUSCULAR | Status: AC | PRN
  Administered 2020-10-17: 2 mL

## 2020-10-17 MED ORDER — BUPIVACAINE HCL 0.25 % IJ SOLN
2.0000 mL | INTRAMUSCULAR | Status: AC | PRN
  Administered 2020-10-17: 2 mL via INTRA_ARTICULAR

## 2020-10-17 NOTE — Progress Notes (Signed)
   Procedure Note  Patient: Yvonne [name]             Date of Birth: 12/19/1970           MRN: [id number]             Visit Date: 10/17/2020  Procedures: Visit Diagnoses:  1. Unilateral primary osteoarthritis, left knee   2. S/P arthroscopy of left knee     Large Joint Inj: L knee on 10/17/2020 10:41 AM Indications: pain Details: 22 G needle, anterolateral approach Medications: 2 mL lidocaine 1 %; 2 mL bupivacaine 0.25 %

## 2020-10-17 NOTE — Progress Notes (Signed)
Post-Op Visit Note   Patient: Yvonne [name]           Date of Birth: 1971/03/20           MRN: [id number] Visit Date: 10/17/2020 PCP: Minette Brine, FNP   Assessment & Plan:  Chief Complaint:  Chief Complaint  Patient presents with  . Left Knee - Follow-up    Left knee arthroscopy with partial medial menisectomy 10/13/20   Visit Diagnoses:  1. Unilateral primary osteoarthritis, left knee   2. S/P arthroscopy of left knee     Plan: Patient is a pleasant 50 year old female who comes in today with concerns about her left knee.  She is 4 days status post left knee arthroscopic debridement medial meniscus, major synovectomy and abrasion arthroplasty medial femoral condyle.  She has noticed increased swelling over the past few days.  She denies any injury.  She does note that she started walking without her crutches.  The majority of her pain is more of a discomfort from the swelling.  This is worse when trying to flex her knee.  No fevers or chills.  Examination of the left knee reveals a large effusion.  Range of motion 0 to 90 degrees.  She is neurovascular intact distally.  Today, we aspirated the left knee where 70cc blood as removed.  She will ice and elevate back off activity.  We have called Ryan with [employer] to get her fitted for a medial DJD unloader brace.  She will follow up with [name] Friday at her previously scheduled appointment for suture removal.  Call with concerns or questions in the meantime.  Follow-Up Instructions: Return in about 3 days (around 10/20/2020).   Orders:  No orders of the defined types were placed in this encounter.  No orders of the defined types were placed in this encounter.   Imaging: No new imaging  PMFS History: Patient Active Problem List   Diagnosis Date Noted  . Chondromalacia, left knee 10/13/2020  . Synovitis of knee 10/13/2020  . Acute medial meniscal tear, left, initial encounter 09/22/2020  . Constipation 06/21/2019  . Mixed  hyperlipidemia 12/15/2018  . Depression 12/15/2018  . S/P laparoscopic hysterectomy - 06/15/12 06/23/2012   Past Medical History:  Diagnosis Date  . Allergy   . Depression   . Fibroid 2005  . H/O menorrhagia 01/16/11  . Herpes 2009  . HPV in female   . Hx: UTI (urinary tract infection) 05/06/11  . Hyperlipidemia   . PONV (postoperative nausea and vomiting)   . Vulvitis 02/04/11  . Yeast infection 06/19/2010    Family History  Problem Relation Age of Onset  . Hypertension Mother   . Heart disease Mother   . Diabetes Father   . Alcohol abuse Father   . Diabetes Brother   . Hypertension Brother   . Alcohol abuse Brother   . Heart attack Maternal Grandfather   . Stomach cancer Maternal Grandfather   . Hypertension Brother   . Crohn's disease Other        mat 2nd cousin  . Irritable bowel syndrome Other        mat. nephew  . Colon cancer Neg Hx   . Breast cancer Neg Hx   . Colon polyps Neg Hx   . Esophageal cancer Neg Hx   . Rectal cancer Neg Hx     Past Surgical History:  Procedure Laterality Date  . ABDOMINAL HYSTERECTOMY    . CESAREAN SECTION  2008 & 2009  .  COLONOSCOPY  11/24/2019  . CRYOTHERAPY  2009  . CYSTOSCOPY  06/15/2012   Procedure: CYSTOSCOPY;  Surgeon: Delice Lesch, MD;  Location: [facility];  Service: Gynecology;  Laterality: N/A;  . KNEE ARTHROSCOPY Left 05/16/2020   Procedure: LEFT KNEE ARTHROSCOPY, PARTIAL MENISCECTOMY;  Surgeon: Melrose Nakayama, MD;  Location: [facility];  Service: Orthopedics;  Laterality: Left;  . KNEE ARTHROSCOPY WITH MEDIAL MENISECTOMY Left 10/13/2020   Procedure: LEFT KNEE ARTHROSCOPY WITH PARTIAL MEDIAL MENISCECTOMY;  Surgeon: Leandrew Koyanagi, MD;  Location: [facility];  Service: Orthopedics;  Laterality: Left;  . LAPAROSCOPIC HYSTERECTOMY  06/15/2012   Procedure: HYSTERECTOMY TOTAL LAPAROSCOPIC;  Surgeon: Delice Lesch, MD;  Location: [facility];  Service: Gynecology;  Laterality: N/A;  . MYOMECTOMY  2005  . TUBAL LIGATION      bilateral   Social History   Occupational History  . Occupation: homemaker    Employer: [employer]  Tobacco Use  . Smoking status: Never Smoker  . Smokeless tobacco: Never Used  Vaping Use  . Vaping Use: Never used  Substance and Sexual Activity  . Alcohol use: Yes    Alcohol/week: 6.0 standard drinks    Types: 6 Glasses of wine per week    Comment: wine daily  . Drug use: No  . Sexual activity: Yes    Birth control/protection: Surgical    Comment: BTL

## 2020-10-19 ENCOUNTER — Encounter: Payer: 59 | Attending: Internal Medicine | Admitting: Registered"

## 2020-10-19 ENCOUNTER — Other Ambulatory Visit: Payer: Self-pay

## 2020-10-19 ENCOUNTER — Encounter: Payer: Self-pay | Admitting: Registered"

## 2020-10-19 DIAGNOSIS — E782 Mixed hyperlipidemia: Secondary | ICD-10-CM | POA: Insufficient documentation

## 2020-10-19 NOTE — Patient Instructions (Addendum)
-   Balance our meals with lean protein, fruit, vegetable, dairy, and starch/grain.   - Aim to increase fiber intake with nuts, seeds, whole grain, fruits, and vegetables.   - Try not go longer than 5 hours without eating.   - See handout for details.

## 2020-10-19 NOTE — Progress Notes (Signed)
Medical Nutrition Therapy  Appointment Start time: 3:18  Appointment End time:  4:12  Primary concerns today: high cholesterol  Referral diagnosis: mixed hyperlipidemia Preferred learning style: no preference indicated Learning readiness: change in progress   NUTRITION ASSESSMENT   Pt arrives stating PCP is giving her 4 months to work on her cholesterol numbers. States she has limited physical abilities due to left knee arthroscopy. Reports she used to be a runner.   States she reduced coffee intake from 6 cups/day to 1.5 cups/day.  States she recently participated in keto diet for 4-5 months and noticed increase in cholesterol levels.    Clinical Medical Hx: mixed hyperlipidemia Medications: See list Labs: elevated Chol (294), elevated A1c (6.1) Notable Signs/Symptoms: none reported  Lifestyle & Dietary Hx  Estimated daily fluid intake: 72 oz Supplements: See list Sleep: 5-6 hrs/night Stress / self-care: none Current average weekly physical activity: none; due to knee arthroscopy  24-Hr Dietary Recall First Meal (5 am): 2 PB crackers Snack:  Second Meal (12 pm): homemade chicken pot pie Snack:  Third Mea (6:30 pm): 2 fried chicken wings + broccoli + small amount of corn pudding Snack: 4 sugar-free m&m's with peanuts Beverages: water (60 oz), coffee (12 oz), wine (2 glasses/day on weekend)   NUTRITION DIAGNOSIS  NB-1.1 Food and nutrition-related knowledge deficit As related to mixed hyperlipidemia.  As evidenced by pt verbalizes incomplete information.   NUTRITION INTERVENTION  Nutrition education (E-1) on the following topics: Nutrition education and counseling. Pt was educated and counseled on high cholesterol, nutritional ways to lower cholesterol, and ways to increase fiber intake. Pt was encouraged to have 3 meals/day and keep up the great work staying hydrated. Pt was in agreement with goals listed.   Handouts Provided Include   High Cholesterol Nutrition  Therapy  Learning Style & Readiness for Change Teaching method utilized: Visual & Auditory  Demonstrated degree of understanding via: Teach Back  Barriers to learning/adherence to lifestyle change: none identified  Goals Established by Pt  Balance our meals with lean protein, fruit, vegetable, dairy, and starch/grain.   Aim to increase fiber intake with nuts, seeds, whole grain, fruits, and vegetables.   Try not go longer than 5 hours without eating.   See handout for details.    MONITORING & EVALUATION Dietary intake, weekly physical activity prn.  Next Steps  Patient is to follow-up prn.

## 2020-10-20 ENCOUNTER — Ambulatory Visit (INDEPENDENT_AMBULATORY_CARE_PROVIDER_SITE_OTHER)
Admission: RE | Admit: 2020-10-20 | Discharge: 2020-10-20 | Disposition: A | Discharge status: Home / Self Care | Payer: Self-pay | Source: Ambulatory Visit | Attending: Internal Medicine | Admitting: Internal Medicine

## 2020-10-20 ENCOUNTER — Other Ambulatory Visit: Payer: Self-pay

## 2020-10-20 ENCOUNTER — Encounter: Payer: Self-pay | Admitting: Orthopaedic Surgery

## 2020-10-20 ENCOUNTER — Ambulatory Visit (INDEPENDENT_AMBULATORY_CARE_PROVIDER_SITE_OTHER): Payer: 59 | Admitting: Physician Assistant

## 2020-10-20 VITALS — Ht 65.0 in | Wt 171.0 lb

## 2020-10-20 DIAGNOSIS — Z9889 Other specified postprocedural states: Secondary | ICD-10-CM

## 2020-10-20 DIAGNOSIS — E782 Mixed hyperlipidemia: Secondary | ICD-10-CM

## 2020-10-20 NOTE — Progress Notes (Signed)
Post-Op Visit Note   Patient: Yvonne [name]           Date of Birth: 04/29/1971           MRN: [id number] Visit Date: 10/20/2020 PCP: Minette Brine, FNP   Assessment & Plan:  Chief Complaint:  Chief Complaint  Patient presents with  . Left Knee - Follow-up    Left knee arthroscopy with partial medial menisectomy 10/13/2020   Visit Diagnoses:  1. S/P arthroscopy of left knee     Plan: Patient is a pleasant 50 year old female who comes in today 1 week out left knee arthroscopic debridement medial meniscus and abrasion arthroplasty.  She has been doing well.  She is scheduled to be fitted for her medial unloader brace this coming Thursday.  Examination of her left knee shows well-healing surgical portals with nylon sutures in place.  No evidence of infection.  Calves are soft nontender.  She is neurovascular intact distally.  Today, sutures removed and Steri-Strips applied.  Home exercise program provided.  Follow-up with [name] in 5 weeks time for recheck.  Call with concerns or questions.  Follow-Up Instructions: Return in about 5 weeks (around 11/24/2020).   Orders:  No orders of the defined types were placed in this encounter.  No orders of the defined types were placed in this encounter.   Imaging: No new imaging  PMFS History: Patient Active Problem List   Diagnosis Date Noted  . Chondromalacia, left knee 10/13/2020  . Synovitis of knee 10/13/2020  . Acute medial meniscal tear, left, initial encounter 09/22/2020  . Constipation 06/21/2019  . Mixed hyperlipidemia 12/15/2018  . Depression 12/15/2018  . S/P laparoscopic hysterectomy - 06/15/12 06/23/2012   Past Medical History:  Diagnosis Date  . Allergy   . Depression   . Fibroid 2005  . H/O menorrhagia 01/16/11  . Herpes 2009  . HPV in female   . Hx: UTI (urinary tract infection) 05/06/11  . Hyperlipidemia   . Hypertension   . PONV (postoperative nausea and vomiting)   . Vulvitis 02/04/11  . Yeast infection  06/19/2010    Family History  Problem Relation Age of Onset  . Hypertension Mother   . Heart disease Mother   . Diabetes Father   . Alcohol abuse Father   . Diabetes Brother   . Hypertension Brother   . Alcohol abuse Brother   . Heart attack Maternal Grandfather   . Stomach cancer Maternal Grandfather   . Hypertension Brother   . Crohn's disease Other        mat 2nd cousin  . Hyperlipidemia Other   . Irritable bowel syndrome Other        mat. nephew  . Colon cancer Neg Hx   . Breast cancer Neg Hx   . Colon polyps Neg Hx   . Esophageal cancer Neg Hx   . Rectal cancer Neg Hx     Past Surgical History:  Procedure Laterality Date  . ABDOMINAL HYSTERECTOMY    . CESAREAN SECTION  2008 & 2009  . COLONOSCOPY  11/24/2019  . CRYOTHERAPY  2009  . CYSTOSCOPY  06/15/2012   Procedure: CYSTOSCOPY;  Surgeon: Delice Lesch, MD;  Location: [facility];  Service: Gynecology;  Laterality: N/A;  . KNEE ARTHROSCOPY Left 05/16/2020   Procedure: LEFT KNEE ARTHROSCOPY, PARTIAL MENISCECTOMY;  Surgeon: Melrose Nakayama, MD;  Location: [facility];  Service: Orthopedics;  Laterality: Left;  . KNEE ARTHROSCOPY WITH MEDIAL MENISECTOMY Left 10/13/2020   Procedure: LEFT KNEE ARTHROSCOPY  WITH PARTIAL MEDIAL MENISCECTOMY;  Surgeon: Leandrew Koyanagi, MD;  Location: [facility];  Service: Orthopedics;  Laterality: Left;  . LAPAROSCOPIC HYSTERECTOMY  06/15/2012   Procedure: HYSTERECTOMY TOTAL LAPAROSCOPIC;  Surgeon: Delice Lesch, MD;  Location: [facility];  Service: Gynecology;  Laterality: N/A;  . MYOMECTOMY  2005  . TUBAL LIGATION     bilateral   Social History   Occupational History  . Occupation: homemaker    Employer: [employer]  Tobacco Use  . Smoking status: Never Smoker  . Smokeless tobacco: Never Used  Vaping Use  . Vaping Use: Never used  Substance and Sexual Activity  . Alcohol use: Yes    Alcohol/week: 6.0 standard drinks    Types: 6 Glasses of wine per week    Comment: wine  daily  . Drug use: No  . Sexual activity: Yes    Birth control/protection: Surgical    Comment: BTL

## 2020-10-23 ENCOUNTER — Other Ambulatory Visit: Payer: Self-pay | Admitting: *Deleted

## 2020-10-23 DIAGNOSIS — R911 Solitary pulmonary nodule: Secondary | ICD-10-CM

## 2020-11-17 ENCOUNTER — Telehealth: Payer: Self-pay | Admitting: Orthopaedic Surgery

## 2020-11-17 NOTE — Telephone Encounter (Signed)
Pt called and would like to know if you can send her prescription for 800 mg ibuprofen.

## 2020-11-20 ENCOUNTER — Encounter: Payer: Self-pay | Admitting: Orthopaedic Surgery

## 2020-11-20 ENCOUNTER — Telehealth: Payer: Self-pay | Admitting: Orthopaedic Surgery

## 2020-11-20 MED ORDER — IBUPROFEN 800 MG PO TABS: 800.0000 mg | ORAL_TABLET | Freq: Three times a day (TID) | ORAL | 0 refills | Status: DC | PRN

## 2020-11-20 NOTE — Telephone Encounter (Signed)
Patient called requesting a refill of ibuprofen. Patient asked if another doctor can sign off on meds and send in due to Dr. Erlinda Hong being in surgery. Please send to pharmacy on file. Patient phone number is [phone].

## 2020-11-20 NOTE — Telephone Encounter (Signed)
Refill sent to pharmacy, ok per  Dr. Erlinda Hong. My Chart message sent to patient to advise.

## 2020-11-20 NOTE — Telephone Encounter (Signed)
Message has been sent to Dr. Erlinda Hong to advise.

## 2020-11-20 NOTE — Telephone Encounter (Signed)
Yes please thanks.

## 2020-11-21 ENCOUNTER — Other Ambulatory Visit: Payer: Self-pay | Admitting: Physician Assistant

## 2020-11-21 NOTE — Telephone Encounter (Signed)
I was out yesterday. Looks like xu sent in

## 2020-11-24 ENCOUNTER — Encounter: Payer: Self-pay | Admitting: Orthopaedic Surgery

## 2020-11-24 ENCOUNTER — Ambulatory Visit (INDEPENDENT_AMBULATORY_CARE_PROVIDER_SITE_OTHER): Payer: 59 | Admitting: Orthopaedic Surgery

## 2020-11-24 ENCOUNTER — Other Ambulatory Visit: Payer: Self-pay

## 2020-11-24 VITALS — Ht 65.0 in | Wt 171.0 lb

## 2020-11-24 DIAGNOSIS — Z9889 Other specified postprocedural states: Secondary | ICD-10-CM

## 2020-11-24 DIAGNOSIS — M1712 Unilateral primary osteoarthritis, left knee: Secondary | ICD-10-CM

## 2020-11-24 MED ORDER — BUPIVACAINE HCL 0.5 % IJ SOLN
2.0000 mL | INTRAMUSCULAR | Status: AC | PRN
  Administered 2020-11-24: 2 mL via INTRA_ARTICULAR

## 2020-11-24 MED ORDER — METHYLPREDNISOLONE ACETATE 40 MG/ML IJ SUSP
40.0000 mg | INTRAMUSCULAR | Status: AC | PRN
  Administered 2020-11-24: 40 mg via INTRA_ARTICULAR

## 2020-11-24 MED ORDER — LIDOCAINE HCL 1 % IJ SOLN
2.0000 mL | INTRAMUSCULAR | Status: AC | PRN
  Administered 2020-11-24: 2 mL

## 2020-11-24 NOTE — Progress Notes (Signed)
Office Visit Note   Patient: Yvonne [name]           Date of Birth: 1970/08/29           MRN: [id number] Visit Date: 11/24/2020              Requested by: Minette Brine, [address],   [id number] PCP: Minette Brine, FNP   Assessment & Plan: Visit Diagnoses:  1. S/P arthroscopy of left knee   2. Unilateral primary osteoarthritis, left knee     Plan: Impression is 6 weeks status post left knee arthroscopy partial medial meniscectomy and abrasion arthroplasty medial compartment.  Again we reviewed the arthroscopic pictures which show focal areas of grade IV chondromalacia within the medial compartment.  I explained that this will take some time before we know if it will provide her with relief.  I do believe that the majority of the symptoms are coming from the effusion.  She agreed to an aspiration injection today.  I obtained approximately 30cc of inflammatory fluid.  Cortisone was then injected.  She will continue to use the unloader brace with activity.  We will recheck her in 6 weeks.  Follow-Up Instructions: Return in about 6 weeks (around 01/05/2021).   Orders:  No orders of the defined types were placed in this encounter.  No orders of the defined types were placed in this encounter.     Procedures: Large Joint Inj: L knee on 11/24/2020 8:32 PM Details: 22 G needle Medications: 2 mL bupivacaine 0.5 %; 2 mL lidocaine 1 %; 40 mg methylPREDNISolone acetate 40 MG/ML Outcome: tolerated well, no immediate complications Patient was prepped and draped in the usual sterile fashion.       Clinical Data: No additional findings.   Subjective: Chief Complaint  Patient presents with  . Left Knee - Follow-up    Left knee arthroscopy with partial medial menisectomy 10/13/2020    Yvonne [name] is approximately 6 weeks status post left knee arthroscopy with partial medial meniscectomy and abrasion arthroplasty.  She has been wearing the unloader brace  during activity.  She feels like the knee still swells throughout the day and with exercises.  Overall she is discouraged by her recovery so far.  She was hoping that she would be feeling better by now.   Review of Systems   Objective: Vital Signs: Ht 5\' 5"  (1.651 m)   Wt 171 lb (77.6 kg)   LMP 05/25/2012   BMI 28.46 kg/m   Physical Exam  Ortho Exam Left knee shows well-healed surgical scars.  Moderate joint effusion.  Mild decreased range of motion secondary to the effusion no medial joint line tenderness. Specialty Comments:  No specialty comments available.  Imaging: No results found.   PMFS History: Patient Active Problem List   Diagnosis Date Noted  . Chondromalacia, left knee 10/13/2020  . Synovitis of knee 10/13/2020  . Acute medial meniscal tear, left, initial encounter 09/22/2020  . Constipation 06/21/2019  . Mixed hyperlipidemia 12/15/2018  . Depression 12/15/2018  . S/P laparoscopic hysterectomy - 06/15/12 06/23/2012   Past Medical History:  Diagnosis Date  . Allergy   . Depression   . Fibroid 2005  . H/O menorrhagia 01/16/11  . Herpes 2009  . HPV in female   . Hx: UTI (urinary tract infection) 05/06/11  . Hyperlipidemia   . Hypertension   . PONV (postoperative nausea and vomiting)   . Vulvitis 02/04/11  . Yeast infection 06/19/2010  Family History  Problem Relation Age of Onset  . Hypertension Mother   . Heart disease Mother   . Diabetes Father   . Alcohol abuse Father   . Diabetes Brother   . Hypertension Brother   . Alcohol abuse Brother   . Heart attack Maternal Grandfather   . Stomach cancer Maternal Grandfather   . Hypertension Brother   . Crohn's disease Other        mat 2nd cousin  . Hyperlipidemia Other   . Irritable bowel syndrome Other        mat. nephew  . Colon cancer Neg Hx   . Breast cancer Neg Hx   . Colon polyps Neg Hx   . Esophageal cancer Neg Hx   . Rectal cancer Neg Hx     Past Surgical History:  Procedure  Laterality Date  . ABDOMINAL HYSTERECTOMY    . CESAREAN SECTION  2008 & 2009  . COLONOSCOPY  11/24/2019  . CRYOTHERAPY  2009  . CYSTOSCOPY  06/15/2012   Procedure: CYSTOSCOPY;  Surgeon: Delice Lesch, MD;  Location: [facility];  Service: Gynecology;  Laterality: N/A;  . KNEE ARTHROSCOPY Left 05/16/2020   Procedure: LEFT KNEE ARTHROSCOPY, PARTIAL MENISCECTOMY;  Surgeon: Melrose Nakayama, MD;  Location: [facility];  Service: Orthopedics;  Laterality: Left;  . KNEE ARTHROSCOPY WITH MEDIAL MENISECTOMY Left 10/13/2020   Procedure: LEFT KNEE ARTHROSCOPY WITH PARTIAL MEDIAL MENISCECTOMY;  Surgeon: Leandrew Koyanagi, MD;  Location: [facility];  Service: Orthopedics;  Laterality: Left;  . LAPAROSCOPIC HYSTERECTOMY  06/15/2012   Procedure: HYSTERECTOMY TOTAL LAPAROSCOPIC;  Surgeon: Delice Lesch, MD;  Location: [facility];  Service: Gynecology;  Laterality: N/A;  . MYOMECTOMY  2005  . TUBAL LIGATION     bilateral   Social History   Occupational History  . Occupation: homemaker    Employer: [employer]  Tobacco Use  . Smoking status: Never Smoker  . Smokeless tobacco: Never Used  Vaping Use  . Vaping Use: Never used  Substance and Sexual Activity  . Alcohol use: Yes    Alcohol/week: 6.0 standard drinks    Types: 6 Glasses of wine per week    Comment: wine daily  . Drug use: No  . Sexual activity: Yes    Birth control/protection: Surgical    Comment: BTL

## 2020-11-27 ENCOUNTER — Encounter: Payer: Self-pay | Admitting: Nurse Practitioner

## 2020-11-28 ENCOUNTER — Ambulatory Visit (INDEPENDENT_AMBULATORY_CARE_PROVIDER_SITE_OTHER): Payer: 59 | Admitting: Nurse Practitioner

## 2020-11-28 ENCOUNTER — Encounter: Payer: Self-pay | Admitting: Nurse Practitioner

## 2020-11-28 ENCOUNTER — Other Ambulatory Visit: Payer: Self-pay

## 2020-11-28 VITALS — BP 112/80 | HR 67 | Temp 98.5°F | Ht 65.0 in | Wt 178.0 lb

## 2020-11-28 DIAGNOSIS — M25562 Pain in left knee: Secondary | ICD-10-CM

## 2020-11-28 DIAGNOSIS — G8929 Other chronic pain: Secondary | ICD-10-CM | POA: Diagnosis not present

## 2020-11-28 DIAGNOSIS — Z6829 Body mass index (BMI) 29.0-29.9, adult: Secondary | ICD-10-CM

## 2020-11-28 DIAGNOSIS — R911 Solitary pulmonary nodule: Secondary | ICD-10-CM

## 2020-11-28 MED ORDER — PHENTERMINE HCL 15 MG PO CAPS: 15.0000 mg | ORAL_CAPSULE | ORAL | 0 refills | Status: DC

## 2020-11-28 NOTE — Patient Instructions (Signed)

## 2020-11-28 NOTE — Progress Notes (Signed)
I,Yamilka Roman [employer] as a Education administrator for [employer], FNP.,have documented all relevant documentation on the behalf of Minette Brine, FNP,as directed by  Minette Brine, FNP while in the presence of Minette Brine, [place]. This visit occurred during the SARS-CoV-2 public health emergency.  Safety protocols were in place, including screening questions prior to the visit, additional usage of staff PPE, and extensive cleaning of exam room while observing appropriate contact time as indicated for disinfecting solutions.  Subjective:     Patient ID: Yvonne [name] , female    DOB: 24-Jul-1971 , 50 y.o.   MRN: [id number]   Chief Complaint  Patient presents with  . Weight Check    HPI  Patient presents today for a weight check. She has been having surgery on her meniscus tear but did not go well. Then found out she is bone on bone, she had the area cleaned out and will see how she is in 6 months. She was encouraged to lose weight.  She has to wear her brace when walking. She is advised to do water aerobics and stationary bike. She is planning to purchase a stationary bike. She does struggle with staying on task when eating. She is interested in restart phentermine. Approximately one year ago her weight was 178 lbs.   Wt Readings from Last 3 Encounters: 11/28/20 : 178 lb (80.7 kg) 11/24/20 : 171 lb (77.6 kg) 10/20/20 : 171 lb (77.6 kg)  She is scheduled to see Dr Debara Pickett later this month. Her Cardiac Calcium Score is 0 but they seen an incidental nodule.  She is to have a repeat scan in the summer. She is on an aggressive diet for cholesterol lowering.      Past Medical History:  Diagnosis Date  . Allergy   . Depression   . Fibroid 2005  . H/O menorrhagia 01/16/11  . Herpes 2009  . HPV in female   . Hx: UTI (urinary tract infection) 05/06/11  . Hyperlipidemia   . Hypertension   . PONV (postoperative nausea and vomiting)   . Vulvitis 02/04/11  . Yeast infection 06/19/2010     Family History   Problem Relation Age of Onset  . Hypertension Mother   . Heart disease Mother   . Diabetes Father   . Alcohol abuse Father   . Diabetes Brother   . Hypertension Brother   . Alcohol abuse Brother   . Heart attack Maternal Grandfather   . Stomach cancer Maternal Grandfather   . Hypertension Brother   . Crohn's disease Other        mat 2nd cousin  . Hyperlipidemia Other   . Irritable bowel syndrome Other        mat. nephew  . Colon cancer Neg Hx   . Breast cancer Neg Hx   . Colon polyps Neg Hx   . Esophageal cancer Neg Hx   . Rectal cancer Neg Hx      Current Outpatient Medications:  .  Adapalene 0.3 % gel, Apply 1 application topically as needed (zits). , Disp: , Rfl:  .  Cyanocobalamin (VITAMIN B 12 PO), Take 1 tablet by mouth daily. , Disp: , Rfl:  .  ibuprofen (ADVIL) 800 MG tablet, Take 1 tablet (800 mg total) by mouth every 8 (eight) hours as needed., Disp: 30 tablet, Rfl: 0 .  ipratropium (ATROVENT) 0.03 % nasal spray, PLACE 1 SPRAY INTO BOTH NOSTRILS DAILY (Patient taking differently: Place 2 sprays into both nostrils daily.), Disp: 30 mL, Rfl: 1 .  MAGNESIUM PO, Take 1 tablet by mouth daily. , Disp: , Rfl:  .  Melatonin 5 MG CHEW, Chew by mouth., Disp: , Rfl:  .  Multiple Vitamins-Minerals (CENTRUM ADULTS PO), Take 1 tablet by mouth daily. , Disp: , Rfl:  .  Omega-3 Fatty Acids (FISH OIL PO), Take 1 capsule by mouth daily. , Disp: , Rfl:  .  PARoxetine (PAXIL) 20 MG tablet, TAKE 1 TABLET BY MOUTH EVERY DAY (Patient taking differently: Take 10 mg by mouth daily.), Disp: 90 tablet, Rfl: 1 .  phentermine 15 MG capsule, Take 1 capsule (15 mg total) by mouth every morning., Disp: 30 capsule, Rfl: 0 .  UNABLE TO FIND, Ashwaganda: take one tablet twice daily as needed, Disp: , Rfl:  .  valACYclovir (VALTREX) 1000 MG tablet, TAKE 1 TABLET BY MOUTH EVERY 12 HOURS (Patient taking differently: Take 1,000 mg by mouth every 12 (twelve) hours as needed (break out).), Disp: 180 tablet,  Rfl: 1 .  VITAMIN D PO, Take 1 capsule by mouth daily., Disp: , Rfl:  .  HYDROcodone-acetaminophen (NORCO) 7.5-325 MG tablet, Take 1-2 tablets by mouth 3 (three) times daily as needed for moderate pain. (Patient not taking: Reported on 11/28/2020), Disp: 30 tablet, Rfl: 0   Allergies  Allergen Reactions  . Sulfa Antibiotics Itching     Review of Systems  Constitutional: Negative.   Respiratory: Negative.   Cardiovascular: Negative.  Negative for chest pain, palpitations and leg swelling.  Musculoskeletal:       Brace to her left knee  Psychiatric/Behavioral: Negative.      Today's Vitals   11/28/20 0852  BP: 112/80  Pulse: 67  Temp: 98.5 F (36.9 C)  TempSrc: Oral  Weight: 178 lb (80.7 kg)  Height: 5\' 5"  (1.651 m)  PainSc: 4   PainLoc: Knee   Body mass index is 29.62 kg/m.   Objective:  Physical Exam Vitals reviewed.  Constitutional:      General: She is not in acute distress.    Appearance: Normal appearance.  Cardiovascular:     Rate and Rhythm: Normal rate and regular rhythm.     Pulses: Normal pulses.     Heart sounds: Normal heart sounds. No murmur heard.   Pulmonary:     Effort: Pulmonary effort is normal. No respiratory distress.     Breath sounds: Normal breath sounds.  Skin:    Capillary Refill: Capillary refill takes less than 2 seconds.  Neurological:     General: No focal deficit present.     Mental Status: She is alert and oriented to person, place, and time.     Cranial Nerves: No cranial nerve deficit.  Psychiatric:        Mood and Affect: Mood normal.        Thought Content: Thought content normal.        Judgment: Judgment normal.         Assessment And Plan:     1. Chronic pain of left knee  She continues follow up with orthopedics and has been advised to not gain any weight as this will cause worsening pain to her knee.   2. BMI 29.0-29.9,adult  I will restart her on phentermine as a jump start  She is having a challenging time  exercising due to her knee pain   She does have a stationary bike she is planning to start using  Return in 2 months for weight check - phentermine 15 MG capsule; Take 1 capsule (15 mg  total) by mouth every morning.  Dispense: 30 capsule; Refill: 0  3. Incidental lung nodule  This was seen when she had her calcium score she is to have a repeat CT scan of her chest in the summer     Patient was given opportunity to ask questions. Patient verbalized understanding of the plan and was able to repeat key elements of the plan. All questions were answered to their satisfaction.  Minette Brine, FNP   I, Minette Brine, FNP, have reviewed all documentation for this visit. The documentation on 11/28/20 for the exam, diagnosis, procedures, and orders are all accurate and complete.   IF YOU HAVE BEEN REFERRED TO A SPECIALIST, IT MAY TAKE 1-2 WEEKS TO SCHEDULE/PROCESS THE REFERRAL. IF YOU HAVE NOT HEARD FROM US/SPECIALIST IN TWO WEEKS, PLEASE GIVE [name] A CALL AT [phone].   THE PATIENT IS ENCOURAGED TO PRACTICE SOCIAL DISTANCING DUE TO THE COVID-19 PANDEMIC.

## 2020-12-11 ENCOUNTER — Encounter: Payer: Self-pay | Admitting: Nurse Practitioner

## 2020-12-12 ENCOUNTER — Other Ambulatory Visit: Payer: Self-pay | Admitting: Nurse Practitioner

## 2020-12-12 DIAGNOSIS — Z6829 Body mass index (BMI) 29.0-29.9, adult: Secondary | ICD-10-CM

## 2020-12-12 MED ORDER — PHENTERMINE HCL 37.5 MG PO CAPS: 37.5000 mg | ORAL_CAPSULE | ORAL | 1 refills | Status: DC

## 2021-01-02 ENCOUNTER — Encounter: Payer: Self-pay | Admitting: Nurse Practitioner

## 2021-01-02 ENCOUNTER — Telehealth: Payer: Self-pay

## 2021-01-02 NOTE — Telephone Encounter (Signed)
I called the pt to schedule her an appt for evaluation of a UTI.

## 2021-01-03 ENCOUNTER — Other Ambulatory Visit: Payer: Self-pay

## 2021-01-03 ENCOUNTER — Other Ambulatory Visit (HOSPITAL_COMMUNITY)
Admission: RE | Admit: 2021-01-03 | Discharge: 2021-01-03 | Disposition: A | Discharge status: Home / Self Care | Payer: 59 | Source: Ambulatory Visit | Attending: Nurse Practitioner | Admitting: Nurse Practitioner

## 2021-01-03 ENCOUNTER — Encounter: Payer: Self-pay | Admitting: Nurse Practitioner

## 2021-01-03 ENCOUNTER — Other Ambulatory Visit: Payer: Self-pay | Admitting: Nurse Practitioner

## 2021-01-03 ENCOUNTER — Ambulatory Visit (INDEPENDENT_AMBULATORY_CARE_PROVIDER_SITE_OTHER): Payer: 59 | Admitting: Nurse Practitioner

## 2021-01-03 VITALS — BP 124/80 | HR 66 | Temp 98.6°F | Ht 65.0 in | Wt 173.4 lb

## 2021-01-03 DIAGNOSIS — R350 Frequency of micturition: Secondary | ICD-10-CM | POA: Diagnosis not present

## 2021-01-03 DIAGNOSIS — E782 Mixed hyperlipidemia: Secondary | ICD-10-CM | POA: Diagnosis not present

## 2021-01-03 DIAGNOSIS — R1030 Lower abdominal pain, unspecified: Secondary | ICD-10-CM | POA: Diagnosis present

## 2021-01-03 DIAGNOSIS — E559 Vitamin D deficiency, unspecified: Secondary | ICD-10-CM | POA: Diagnosis not present

## 2021-01-03 LAB — POCT URINALYSIS DIPSTICK
Bilirubin, UA: NEGATIVE
Blood, UA: NEGATIVE
Glucose, UA: NEGATIVE
Ketones, UA: NEGATIVE
Leukocytes, UA: NEGATIVE
Nitrite, UA: NEGATIVE
Protein, UA: NEGATIVE
Spec Grav, UA: 1.01 (ref 1.010–1.025)
Urobilinogen, UA: 0.2 E.U./dL
pH, UA: 7.5 (ref 5.0–8.0)

## 2021-01-03 MED ORDER — URIBEL 118 MG PO CAPS: 1.0000 | ORAL_CAPSULE | Freq: Four times a day (QID) | ORAL | 1 refills | Status: DC | PRN

## 2021-01-03 NOTE — Progress Notes (Signed)
I,Yamilka Roman [employer] as a Education administrator for [employer], FNP.,have documented all relevant documentation on the behalf of Minette Brine, FNP,as directed by  Minette Brine, FNP while in the presence of Minette Brine, [place].  This visit occurred during the SARS-CoV-2 public health emergency.  Safety protocols were in place, including screening questions prior to the visit, additional usage of staff PPE, and extensive cleaning of exam room while observing appropriate contact time as indicated for disinfecting solutions.  Subjective:     Patient ID: Yvonne [name] , female    DOB: 01-16-1971 , 50 y.o.   MRN: [id number]   Chief Complaint  Patient presents with  . Urinary Tract Infection    Pt visited her GYN last week, pt currently does not have one at the moment. Pt does feel discomfort in lower abdomen,having the urge to urinate frequently.     HPI  She is here today with symptoms of a UTI.  She has been taking azo, symptoms are relieved but discomfort returns. She has also been taking Boric Acid.   Wt Readings from Last 3 Encounters: 01/03/21 : 173 lb 6.4 oz (78.7 kg) 11/28/20 : 178 lb (80.7 kg) 11/24/20 : 171 lb (77.6 kg)   Urinary Tract Infection  This is a chronic problem. The current episode started 1 to 4 weeks ago (3 weeks ago). The quality of the pain is described as burning. There has been no fever. She is not sexually active. There is no history of pyelonephritis. Associated symptoms include frequency and urgency. Pertinent negatives include no chills or vomiting. Treatments tried: currently on macrobid. There is no history of catheterization or recurrent UTIs (has had one in the past but has been several years).     Past Medical History:  Diagnosis Date  . Allergy   . Depression   . Fibroid 2005  . H/O menorrhagia 01/16/11  . Herpes 2009  . HPV in female   . Hx: UTI (urinary tract infection) 05/06/11  . Hyperlipidemia   . Hypertension   . PONV (postoperative nausea and  vomiting)   . Vulvitis 02/04/11  . Yeast infection 06/19/2010     Family History  Problem Relation Age of Onset  . Hypertension Mother   . Heart disease Mother   . Diabetes Father   . Alcohol abuse Father   . Diabetes Brother   . Hypertension Brother   . Alcohol abuse Brother   . Heart attack Maternal Grandfather   . Stomach cancer Maternal Grandfather   . Hypertension Brother   . Crohn's disease Other        mat 2nd cousin  . Hyperlipidemia Other   . Irritable bowel syndrome Other        mat. nephew  . Colon cancer Neg Hx   . Breast cancer Neg Hx   . Colon polyps Neg Hx   . Esophageal cancer Neg Hx   . Rectal cancer Neg Hx      Current Outpatient Medications:  .  Adapalene 0.3 % gel, Apply 1 application topically as needed (zits). , Disp: , Rfl:  .  Cyanocobalamin (VITAMIN B 12 PO), Take 1 tablet by mouth daily. , Disp: , Rfl:  .  ibuprofen (ADVIL) 800 MG tablet, Take 1 tablet (800 mg total) by mouth every 8 (eight) hours as needed., Disp: 30 tablet, Rfl: 0 .  ipratropium (ATROVENT) 0.03 % nasal spray, PLACE 1 SPRAY INTO BOTH NOSTRILS DAILY (Patient taking differently: Place 2 sprays into both nostrils daily.), Disp:  30 mL, Rfl: 1 .  MAGNESIUM PO, Take 1 tablet by mouth daily. , Disp: , Rfl:  .  Melatonin 5 MG CHEW, Chew by mouth., Disp: , Rfl:  .  Multiple Vitamins-Minerals (CENTRUM ADULTS PO), Take 1 tablet by mouth daily. , Disp: , Rfl:  .  Omega-3 Fatty Acids (FISH OIL PO), Take 1 capsule by mouth daily. , Disp: , Rfl:  .  PARoxetine (PAXIL) 20 MG tablet, TAKE 1 TABLET BY MOUTH EVERY DAY (Patient taking differently: Take 10 mg by mouth daily.), Disp: 90 tablet, Rfl: 1 .  phentermine 37.5 MG capsule, Take 1 capsule (37.5 mg total) by mouth every morning., Disp: 30 capsule, Rfl: 1 .  UNABLE TO FIND, Ashwaganda: take one tablet twice daily as needed, Disp: , Rfl:  .  valACYclovir (VALTREX) 1000 MG tablet, TAKE 1 TABLET BY MOUTH EVERY 12 HOURS (Patient taking differently:  Take 1,000 mg by mouth every 12 (twelve) hours as needed (break out).), Disp: 180 tablet, Rfl: 1 .  VITAMIN D PO, Take 1 capsule by mouth daily., Disp: , Rfl:  .  HYDROcodone-acetaminophen (NORCO) 7.5-325 MG tablet, Take 1-2 tablets by mouth 3 (three) times daily as needed for moderate pain. (Patient not taking: No sig reported), Disp: 30 tablet, Rfl: 0 .  phenazopyridine (PYRIDIUM) 100 MG tablet, Take 1 tablet (100 mg total) by mouth 3 (three) times daily as needed for pain., Disp: 6 tablet, Rfl: 1   Allergies  Allergen Reactions  . Sulfa Antibiotics Itching     Review of Systems  Constitutional: Negative for chills and fever.  Respiratory: Negative.   Cardiovascular: Negative.   Gastrointestinal: Negative for vomiting.  Endocrine: Positive for polyuria. Negative for polydipsia and polyphagia.  Genitourinary: Positive for dysuria, frequency and urgency. Negative for vaginal discharge and vaginal pain.  Neurological: Negative.  Negative for dizziness and headaches.  Psychiatric/Behavioral: Negative.      Today's Vitals   01/03/21 0942  BP: 124/80  Pulse: 66  Temp: 98.6 F (37 C)  SpO2: 99%  Weight: 173 lb 6.4 oz (78.7 kg)  Height: 5\' 5"  (1.651 m)  PainSc: 0-No pain   Body mass index is 28.86 kg/m.  Wt Readings from Last 3 Encounters:  01/03/21 173 lb 6.4 oz (78.7 kg)  11/28/20 178 lb (80.7 kg)  11/24/20 171 lb (77.6 kg)   Objective:  Physical Exam Vitals reviewed.  Constitutional:      General: She is not in acute distress.    Appearance: Normal appearance.  Cardiovascular:     Rate and Rhythm: Normal rate and regular rhythm.     Pulses: Normal pulses.     Heart sounds: Normal heart sounds. No murmur heard.   Pulmonary:     Effort: Pulmonary effort is normal. No respiratory distress.     Breath sounds: Normal breath sounds. No wheezing.  Abdominal:     General: Abdomen is flat. Bowel sounds are normal.     Palpations: Abdomen is soft.     Tenderness: There is  abdominal tenderness (suprapubic area).  Skin:    Capillary Refill: Capillary refill takes less than 2 seconds.  Neurological:     General: No focal deficit present.     Mental Status: She is alert and oriented to person, place, and time.     Cranial Nerves: No cranial nerve deficit.     Motor: No weakness.  Psychiatric:        Mood and Affect: Mood normal.  Thought Content: Thought content normal.        Judgment: Judgment normal.         Assessment And Plan:     1. Urinary frequency  She was treated with macrobid but not doing better  Will send urine culture  Urinalysis is normal in office - POCT Urinalysis Dipstick (81002) - Culture, Urine - Hemoglobin A1c - Urine cytology ancillary only  2. Mixed hyperlipidemia  Chronic, controlled  No current medications  3. Vitamin D deficiency  Will check vitamin D level and supplement as needed.     Also encouraged to spend 15 minutes in the sun daily.   4. Lower abdominal pain  Tender suprapubic area, will send urine to check for any STDs - Urine cytology ancillary only      Patient was given opportunity to ask questions. Patient verbalized understanding of the plan and was able to repeat key elements of the plan. All questions were answered to their satisfaction.  Minette Brine, FNP   I, Minette Brine, FNP, have reviewed all documentation for this visit. The documentation on 01/03/21 for the exam, diagnosis, procedures, and orders are all accurate and complete.   IF YOU HAVE BEEN REFERRED TO A SPECIALIST, IT MAY TAKE 1-2 WEEKS TO SCHEDULE/PROCESS THE REFERRAL. IF YOU HAVE NOT HEARD FROM US/SPECIALIST IN TWO WEEKS, PLEASE GIVE [name] A CALL AT [phone].   THE PATIENT IS ENCOURAGED TO PRACTICE SOCIAL DISTANCING DUE TO THE COVID-19 PANDEMIC.

## 2021-01-04 ENCOUNTER — Telehealth: Payer: Self-pay

## 2021-01-04 LAB — HEMOGLOBIN A1C
Est. average glucose Bld gHb Est-mCnc: 140 mg/dL
Hgb A1c MFr Bld: 6.5 % — ABNORMAL HIGH (ref 4.8–5.6)

## 2021-01-04 NOTE — Progress Notes (Signed)
If she is willing to take ozempic we can start her on that. She will not qualify for the libre or dexcom since she is not on insulin. If she does not want to do insulin start metformin 500 mg BID and follow up in 6 weeks for med check

## 2021-01-04 NOTE — Telephone Encounter (Signed)
-----   Message from Minette Brine, [place] sent at 01/04/2021 10:00 AM EDT ----- Your HgbA1c is up to 6.5 this is up from 6.1, this may be slightly causing you to have an increase in urinary symptoms. Are you eating more sugary foods and drinks or carbohydrates? We can treat you with a medication that would help lower this number, once over 6.5 this is considered diabetes. What are your thoughts

## 2021-01-04 NOTE — Telephone Encounter (Signed)
I left the pt a message to respond to the questions in her mychart with her lab results or she can call back with the responses.

## 2021-01-05 LAB — URINE CYTOLOGY ANCILLARY ONLY
Candida Urine: NEGATIVE
Chlamydia: NEGATIVE
Comment: NEGATIVE
Comment: NEGATIVE
Comment: NORMAL
Neisseria Gonorrhea: NEGATIVE
Trichomonas: NEGATIVE

## 2021-01-05 LAB — URINE CULTURE

## 2021-01-09 ENCOUNTER — Other Ambulatory Visit: Payer: Self-pay

## 2021-01-09 ENCOUNTER — Ambulatory Visit: Payer: 59

## 2021-01-09 VITALS — BP 114/80 | HR 86 | Temp 98.7°F | Ht 64.6 in | Wt 173.4 lb

## 2021-01-09 DIAGNOSIS — R7309 Other abnormal glucose: Secondary | ICD-10-CM

## 2021-01-09 MED ORDER — OZEMPIC (0.25 OR 0.5 MG/DOSE) 2 MG/1.5ML ~~LOC~~ SOPN: 0.5000 mg | PEN_INJECTOR | SUBCUTANEOUS | 1 refills | Status: DC

## 2021-01-09 NOTE — Progress Notes (Signed)
Patient presents today for ozempic teaching. Patient self administered and understood instructions she will f/u with provider. YL,RMA

## 2021-01-16 ENCOUNTER — Ambulatory Visit: Payer: 59 | Admitting: Nurse Practitioner

## 2021-01-29 ENCOUNTER — Ambulatory Visit: Payer: 59 | Admitting: Nurse Practitioner

## 2021-02-02 LAB — LIPID PANEL
Chol/HDL Ratio: 3.7 ratio (ref 0.0–4.4)
Cholesterol, Total: 272 mg/dL — ABNORMAL HIGH (ref 100–199)
HDL: 73 mg/dL (ref >39)
LDL Chol Calc (NIH): 191 mg/dL — ABNORMAL HIGH (ref 0–99)
Triglycerides: 55 mg/dL (ref 0–149)
VLDL Cholesterol Cal: 8 mg/dL (ref 5–40)

## 2021-02-06 ENCOUNTER — Telehealth (INDEPENDENT_AMBULATORY_CARE_PROVIDER_SITE_OTHER): Payer: 59 | Admitting: Internal Medicine

## 2021-02-06 ENCOUNTER — Encounter: Payer: Self-pay | Admitting: Internal Medicine

## 2021-02-06 ENCOUNTER — Other Ambulatory Visit: Payer: Self-pay | Admitting: *Deleted

## 2021-02-06 ENCOUNTER — Telehealth: Payer: Self-pay | Admitting: *Deleted

## 2021-02-06 VITALS — BP 132/87 | HR 70 | Ht 65.0 in | Wt 166.0 lb

## 2021-02-06 DIAGNOSIS — Z7189 Other specified counseling: Secondary | ICD-10-CM | POA: Diagnosis not present

## 2021-02-06 DIAGNOSIS — E7849 Other hyperlipidemia: Secondary | ICD-10-CM | POA: Diagnosis not present

## 2021-02-06 DIAGNOSIS — R911 Solitary pulmonary nodule: Secondary | ICD-10-CM

## 2021-02-06 MED ORDER — REPATHA SURECLICK 140 MG/ML ~~LOC~~ SOAJ: 1.0000 | SUBCUTANEOUS | 3 refills | Status: DC

## 2021-02-06 NOTE — Progress Notes (Signed)
Virtual Visit via Video Note   This visit type was conducted due to national recommendations for restrictions regarding the COVID-19 Pandemic (e.g. social distancing) in an effort to limit this patient's exposure and mitigate transmission in our community.  Due to her co-morbid illnesses, this patient is at least at moderate risk for complications without adequate follow up.  This format is felt to be most appropriate for this patient at this time.  All issues noted in this document were discussed and addressed.  A limited physical exam was performed with this format.  Please refer to the patient's chart for her consent to telehealth for [facility]      Date:  02/06/2021   ID:  Yvonne [name], DOB 02/17/1971, MRN [id number] The patient was identified using 2 identifiers.  Evaluation Performed:  Follow-Up Visit  Patient Location:  [place] [address]  Provider location:   [address] [place], [place] [address]  PCP:  Minette Brine, FNP  Cardiologist:  None Electrophysiologist:  None   Chief Complaint:  Follow-up dyslipidemia  History of Present Illness:    Yvonne [name] is a 50 y.o. female who presents via audio/video conferencing for a telehealth visit today.  This is a pleasant female kindly referred for evaluation and management of dyslipidemia.  She reports history of elevated cholesterol however more recently her cholesterol has trended upwards.  This is also notable to be associated with weight gain, less activity due to struggles with left knee pain (she has an upcoming repeat knee surgery) and an atherogenic diet.  She reports a family history of heart disease including her maternal grandfather had an MI in his 12s and her mother who had high cholesterol and is of heart care patient.  Also more recently she tried a ketogenic diet.  This seems to correlate with her increasing cholesterol particularly her LDL.  Her most recent lipid  profile showed total cholesterol 294, triglycerides 85, HDL 78 and LDL 202.  Hemoglobin A1c was 6.1.  She is not on treatment for diabetes or any medications for that.  02/06/21  [name] was seen today for follow-up.  She underwent coronary calcium scoring in February 2022 which showed his calcium score is 0.  Incidentally there was a 6 mm nodule noted in the right middle lobe.  A repeat contrast chest CT was recommended in 6 months and that will be due in the end of August/2022.  She was planning to work on dietary changes and lifestyle modifications in order to lower her cholesterol further.  She has subsequently lost more than 10 pounds and has made significant dietary changes.  This, her cholesterol remains elevated with total 272, triglyceride 55, HDL 73 and LDL 191.  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  The patient does not have symptoms concerning for COVID-19 infection (fever, chills, cough, or new SHORTNESS OF BREATH).    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    Hx: UTI (urinary tract infection) 05/06/11   Hyperlipidemia    Hypertension    PONV (postoperative nausea and vomiting)    Vulvitis 02/04/11   Yeast infection 06/19/2010    Past Surgical History:  Procedure Laterality Date   ABDOMINAL  HYSTERECTOMY     CESAREAN SECTION  2008 & 2009   COLONOSCOPY  11/24/2019   CRYOTHERAPY  2009   CYSTOSCOPY  06/15/2012   Procedure: CYSTOSCOPY;  Surgeon: Delice Lesch, MD;  Location: [facility];  Service: Gynecology;  Laterality: N/A;   KNEE ARTHROSCOPY Left 05/16/2020   Procedure: LEFT KNEE ARTHROSCOPY, PARTIAL MENISCECTOMY;  Surgeon: Melrose Nakayama, MD;  Location: [facility];  Service: Orthopedics;  Laterality: Left;   KNEE ARTHROSCOPY WITH MEDIAL MENISECTOMY Left  10/13/2020   Procedure: LEFT KNEE ARTHROSCOPY WITH PARTIAL MEDIAL MENISCECTOMY;  Surgeon: Leandrew Koyanagi, MD;  Location: [facility];  Service: Orthopedics;  Laterality: Left;   LAPAROSCOPIC HYSTERECTOMY  06/15/2012   Procedure: HYSTERECTOMY TOTAL LAPAROSCOPIC;  Surgeon: Delice Lesch, MD;  Location: [facility];  Service: Gynecology;  Laterality: N/A;   MYOMECTOMY  2005   TUBAL LIGATION     bilateral    FAMHx:  Family History  Problem Relation Age of Onset   Hypertension Mother    Heart disease Mother    Diabetes Father    Alcohol abuse Father    Diabetes Brother    Hypertension Brother    Alcohol abuse Brother    Heart attack Maternal Grandfather    Stomach cancer Maternal Grandfather    Hypertension Brother    Crohn's disease Other        mat 2nd cousin   Hyperlipidemia Other    Irritable bowel syndrome Other        mat. nephew   Colon cancer Neg Hx    Breast cancer Neg Hx    Colon polyps Neg Hx    Esophageal cancer Neg Hx    Rectal cancer Neg Hx     SOCHx:   reports that she has never smoked. She has never used smokeless tobacco. She reports current alcohol use of about 6.0 standard drinks of alcohol per week. She reports that she does not use drugs.  ALLERGIES:  Allergies  Allergen Reactions   Statins Other (See Comments)    myalgias   Sulfa Antibiotics Itching    MEDS:  Current Meds  Medication Sig   Adapalene 0.3 % gel Apply 1 application topically as needed (zits).    Cyanocobalamin (VITAMIN B 12 PO) Take 1 tablet by mouth daily.    HYDROcodone-acetaminophen (NORCO) 7.5-325 MG tablet Take 1-2 tablets by mouth 3 (three) times daily as needed for moderate pain.   ibuprofen (ADVIL) 800 MG tablet Take 1 tablet (800 mg total) by mouth every 8 (eight) hours as needed.   ipratropium (ATROVENT) 0.03 % nasal spray PLACE 1 SPRAY INTO BOTH NOSTRILS DAILY (Patient taking differently: Place 2 sprays into both nostrils daily.)   MAGNESIUM PO Take 1 tablet by  mouth daily.    Melatonin 5 MG CHEW Chew by mouth.   Multiple Vitamins-Minerals (CENTRUM ADULTS PO) Take 1 tablet by mouth daily.    Omega-3 Fatty Acids (FISH OIL PO) Take 1 capsule by mouth daily.    PARoxetine (PAXIL) 20 MG tablet TAKE 1 TABLET BY MOUTH EVERY DAY (Patient taking differently: Take 10 mg by mouth daily.)   phentermine 37.5 MG capsule Take 1 capsule (37.5 mg total) by mouth every morning.   Semaglutide,0.25 or 0.5MG /DOS, (OZEMPIC, 0.25 OR 0.5 MG/DOSE,) 2 MG/1.5ML SOPN Inject 0.5 mg into the skin once a week.   UNABLE TO FIND Ashwaganda: take one tablet twice daily as needed   valACYclovir (VALTREX) 1000 MG tablet TAKE 1 TABLET BY MOUTH EVERY 12  HOURS (Patient taking differently: Take 1,000 mg by mouth every 12 (twelve) hours as needed (break out).)   VITAMIN D PO Take 1 capsule by mouth daily.   [DISCONTINUED] phenazopyridine (PYRIDIUM) 100 MG tablet Take 1 tablet (100 mg total) by mouth 3 (three) times daily as needed for pain.     ROS: Pertinent items noted in HPI and remainder of comprehensive ROS otherwise negative.  Labs/Other Tests and Data Reviewed:    Recent Labs: 07/18/2020: ALT 16; BUN 10; Creatinine, Ser 0.77; Hemoglobin 14.1; Platelets 275; Potassium 4.8; Sodium 140   Recent Lipid Panel Lab Results  Component Value Date/Time   CHOL 272 (H) 02/02/2021 09:58 AM   TRIG 55 02/02/2021 09:58 AM   HDL 73 02/02/2021 09:58 AM   CHOLHDL 3.7 02/02/2021 09:58 AM   LDLCALC 191 (H) 02/02/2021 09:58 AM    Wt Readings from Last 3 Encounters:  02/06/21 166 lb (75.3 kg)  01/09/21 173 lb 6.4 oz (78.7 kg)  01/03/21 173 lb 6.4 oz (78.7 kg)     Exam:    Vital Signs:  BP 132/87   Pulse 70   Ht 5\' 5"  (1.651 m)   Wt 166 lb (75.3 kg)   LMP 05/25/2012   BMI 27.62 kg/m    General appearance: alert and no distress Lungs: No visual respiratory difficulty Abdomen: Mildly overweight Extremities: extremities normal, atraumatic, no cyanosis or edema Skin: Skin color,  texture, turgor normal. 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  Although the genetics cannot be clearly traced in her family, her cholesterol remains high despite eating a very healthy diet and recent 10 to 15 pound weight loss.  LDL remains above 190.  She was not found to have any coronary calcification.  Unfortunately she cannot tolerate statins including atorvastatin and rosuvastatin and will not likely meet the guideline recommendations of greater than 50% LDL reduction for patients with LDL greater than 190.  Based on that and the concern that this is a familial hyperlipidemia, would recommend monotherapy with a PCSK9 inhibitor.  We will pursue Repatha 140 mg every 2 weeks.  Plan repeat lipids including an NMR and LP(a) in about 3 months.  COVID-19 Education: The signs and symptoms of COVID-19 were discussed with the patient and how to seek care for testing (follow up with PCP or arrange E-visit).  The importance of social distancing was discussed today.  Patient Risk:   After full review of this patients clinical status, I feel that they are at least moderate risk at this time.  Time:   Today, I have spent 25 minutes with the patient with telehealth technology discussing dyslipidemia, FH, statin intolerance.     Medication Adjustments/Labs and Tests Ordered: Current medicines are reviewed at length with the patient today.  Concerns regarding medicines are outlined above.   Tests Ordered: Orders Placed This Encounter  Procedures   Lipoprotein A (LPA)   NMR, lipoprofile    Medication Changes: No orders of the defined types were placed in this  encounter.   Disposition:  in 3 month(s)  Pixie Casino, MD, [facility], [place] Director of the [facility] Lipid [facility]  Cardi[facility] Diplomate of the American  Board of Clinical Lipidology Attending Cardiologist  Direct Dial: [phone]  Fax: [phone]  Website:  [email or web address]  Pixie Casino, MD  02/06/2021 10:08 AM

## 2021-02-06 NOTE — Telephone Encounter (Signed)
  Patient Consent for Virtual Visit        Yvonne [name] has provided verbal consent on 02/06/2021 for a virtual visit (video or telephone).   CONSENT FOR VIRTUAL VISIT FOR:  Yvonne [name]  By participating in this virtual visit I agree to the following:  I hereby voluntarily request, consent and authorize [facility] and its employed or contracted physicians, physician assistants, nurse practitioners or other licensed health care professionals (the Practitioner), to provide me with telemedicine health care services (the "Services") as deemed necessary by the treating Practitioner. I acknowledge and consent to receive the Services by the Practitioner via telemedicine. I understand that the telemedicine visit will involve communicating with the Practitioner through live audiovisual communication technology and the disclosure of certain medical information by electronic transmission. I acknowledge that I have been given the opportunity to request an in-person assessment or other available alternative prior to the telemedicine visit and am voluntarily participating in the telemedicine visit.  I understand that I have the right to withhold or withdraw my consent to the use of telemedicine in the course of my care at any time, without affecting my right to future care or treatment, and that the Practitioner or I may terminate the telemedicine visit at any time. I understand that I have the right to inspect all information obtained and/or recorded in the course of the telemedicine visit and may receive copies of available information for a reasonable fee.  I understand that some of the potential risks of receiving the Services via telemedicine include:  Delay or interruption in medical evaluation due to technological equipment failure or disruption; Information transmitted may not be sufficient (e.g. poor resolution of images) to allow for appropriate medical decision making by the Practitioner; and/or   In rare instances, security protocols could fail, causing a breach of personal health information.  Furthermore, I acknowledge that it is my responsibility to provide information about my medical history, conditions and care that is complete and accurate to the best of my ability. I acknowledge that Practitioner's advice, recommendations, and/or decision may be based on factors not within their control, such as incomplete or inaccurate data provided by me or distortions of diagnostic images or specimens that may result from electronic transmissions. I understand that the practice of medicine is not an exact science and that Practitioner makes no warranties or guarantees regarding treatment outcomes. I acknowledge that a copy of this consent can be made available to me via my patient portal ([facility]), or I can request a printed copy by calling the office of [facility].    I understand that my insurance will be billed for this visit.   I have read or had this consent read to me. I understand the contents of this consent, which adequately explains the benefits and risks of the Services being provided via telemedicine.  I have been provided ample opportunity to ask questions regarding this consent and the Services and have had my questions answered to my satisfaction. I give my informed consent for the services to be provided through the use of telemedicine in my medical care

## 2021-02-06 NOTE — Addendum Note (Signed)
Addended by: Fidel Levy on: 02/06/2021 10:22 AM   Modules accepted: Orders

## 2021-02-06 NOTE — Patient Instructions (Signed)
Medication Instructions:  Your physician has recommended you make the following change in your medication:   START Repatha Injections.  Will let you know once we have it approved by your insurance.  *If you need a refill on your cardiac medications before your next appointment, please call your pharmacy*   Lab Work: 3 MONTHS:  COME FOR FASTING LABS:  LIPID NMR, LP(A)  If you have labs (blood work) drawn today and your tests are completely normal, you will receive your results only by: MyChart Message (if you have MyChart) OR A paper copy in the mail If you have any lab test that is abnormal or we need to change your treatment, we will call you to review the results.   Testing/Procedures: None ordered   Follow-Up: At [facility], you and your health needs are our priority.  As part of our continuing mission to provide you with exceptional heart care, we have created designated Provider Care Teams.  These Care Teams include your primary Cardiologist (physician) and Advanced Practice Providers (APPs -  Physician Assistants and Nurse Practitioners) who all work together to provide you with the care you need, when you need it.  We recommend signing up for the patient portal called "MyChart".  Sign up information is provided on this After Visit Summary.  MyChart is used to connect with patients for Virtual Visits (Telemedicine).  Patients are able to view lab/test results, encounter notes, upcoming appointments, etc.  Non-urgent messages can be sent to your provider as well.   To learn more about what you can do with MyChart, go to [email or web address].    Your next appointment:   3 month(s)  The format for your next appointment:   Either  Provider:   K. Mali Hilty, MD   Other Instructions

## 2021-02-07 ENCOUNTER — Encounter: Payer: Self-pay | Admitting: Nurse Practitioner

## 2021-02-07 ENCOUNTER — Ambulatory Visit (INDEPENDENT_AMBULATORY_CARE_PROVIDER_SITE_OTHER): Payer: 59 | Admitting: Nurse Practitioner

## 2021-02-07 ENCOUNTER — Other Ambulatory Visit: Payer: Self-pay

## 2021-02-07 VITALS — BP 120/78 | HR 83 | Temp 98.3°F | Ht 66.4 in | Wt 168.4 lb

## 2021-02-07 DIAGNOSIS — Z6827 Body mass index (BMI) 27.0-27.9, adult: Secondary | ICD-10-CM

## 2021-02-07 DIAGNOSIS — R7309 Other abnormal glucose: Secondary | ICD-10-CM | POA: Diagnosis not present

## 2021-02-07 NOTE — Patient Instructions (Signed)
Exercising to Lose Weight Exercise is structured, repetitive physical activity to improve fitness and health. Getting regular exercise is important for everyone. It is especially important if you are overweight. Being overweight increases your risk of heart disease, stroke, diabetes, high blood pressure, and several types of cancer.Reducing your calorie intake and exercising can help you lose weight. Exercise is usually categorized as moderate or vigorous intensity. To lose weight, most people need to do a certain amount of moderate-intensity orvigorous-intensity exercise each week. Moderate-intensity exercise  Moderate-intensity exercise is any activity that gets you moving enough to burn at least three times more energy (calories) than if you were sitting. Examples of moderate exercise include: Walking a mile in 15 minutes. Doing light yard work. Biking at an easy pace. Most people should get at least 150 minutes (2 hours and 30 minutes) a week ofmoderate-intensity exercise to maintain their body weight. Vigorous-intensity exercise Vigorous-intensity exercise is any activity that gets you moving enough to burn at least six times more calories than if you were sitting. When you exercise at this intensity, you should be working hard enough that you are not able tocarry on a conversation. Examples of vigorous exercise include: Running. Playing a team sport, such as football, basketball, and soccer. Jumping rope. Most people should get at least 75 minutes (1 hour and 15 minutes) a week ofvigorous-intensity exercise to maintain their body weight. How can exercise affect me? When you exercise enough to burn more calories than you eat, you lose weight. Exercise also reduces body fat and builds muscle. The more muscle you have, the more calories you burn. Exercise also: Improves mood. Reduces stress and tension. Improves your overall fitness, flexibility, and endurance. Increases bone strength. The  amount of exercise you need to lose weight depends on: Your age. The type of exercise. Any health conditions you have. Your overall physical ability. Talk to your health care provider about how much exercise you need and whattypes of activities are safe for you. What actions can I take to lose weight? Nutrition  Make changes to your diet as told by your health care provider or diet and nutrition specialist (dietitian). This may include: Eating fewer calories. Eating more protein. Eating less unhealthy fats. Eating a diet that includes fresh fruits and vegetables, whole grains, low-fat dairy products, and lean protein. Avoiding foods with added fat, salt, and sugar. Drink plenty of water while you exercise to prevent dehydration or heat stroke.  Activity Choose an activity that you enjoy and set realistic goals. Your health care provider can help you make an exercise plan that works for you. Exercise at a moderate or vigorous intensity most days of the week. The intensity of exercise may vary from person to person. You can tell how intense a workout is for you by paying attention to your breathing and heartbeat. Most people will notice their breathing and heartbeat get faster with more intense exercise. Do resistance training twice each week, such as: Push-ups. Sit-ups. Lifting weights. Using resistance bands. Getting short amounts of exercise can be just as helpful as long structured periods of exercise. If you have trouble finding time to exercise, try to include exercise in your daily routine. Get up, stretch, and walk around every 30 minutes throughout the day. Go for a walk during your lunch break. Park your car farther away from your destination. If you take public transportation, get off one stop early and walk the rest of the way. Make phone calls while standing up and   walking around. Take the stairs instead of elevators or escalators. Wear comfortable clothes and shoes with  good support. Do not exercise so much that you hurt yourself, feel dizzy, or get very short of breath. Where to find more information U.S. Department of Health and Human Services: www.hhs.gov Centers for Disease Control and Prevention (CDC): www.cdc.gov Contact a health care provider: Before starting a new exercise program. If you have questions or concerns about your weight. If you have a medical problem that keeps you from exercising. Get help right away if you have any of the following while exercising: Injury. Dizziness. Difficulty breathing or shortness of breath that does not go away when you stop exercising. Chest pain. Rapid heartbeat. Summary Being overweight increases your risk of heart disease, stroke, diabetes, high blood pressure, and several types of cancer. Losing weight happens when you burn more calories than you eat. Reducing the amount of calories you eat in addition to getting regular moderate or vigorous exercise each week helps you lose weight. This information is not intended to replace advice given to you by your health care provider. Make sure you discuss any questions you have with your healthcare provider. Document Revised: 11/22/2019 Document Reviewed: 12/09/2019 Elsevier Patient Education  2022 [employer]  

## 2021-02-07 NOTE — Progress Notes (Signed)
I,Yamilka Roman [employer] as a Education administrator for [employer], FNP.,have documented all relevant documentation on the behalf of Minette Brine, FNP,as directed by  Minette Brine, FNP while in the presence of Minette Brine, [place].  This visit occurred during the SARS-CoV-2 public health emergency.  Safety protocols were in place, including screening questions prior to the visit, additional usage of staff PPE, and extensive cleaning of exam room while observing appropriate contact time as indicated for disinfecting solutions.  Subjective:     Patient ID: Yvonne [name] , female    DOB: Jan 22, 1971 , 50 y.o.   MRN: [id number]   Chief Complaint  Patient presents with   Weight Check    HPI  Patient presents today for a weight check, she is tolerating phentermine well. She is taking Ozempic 0.5 mg weekly - denies any nausea. The phentermine does not work as well as before.   Wt Readings from Last 3 Encounters: 02/07/21 : 168 lb 6.4 oz (76.4 kg) 02/06/21 : 166 lb (75.3 kg) 01/09/21 : 173 lb 6.4 oz (78.7 kg)   She had started with Dr. Latanya Maudlin and now seeing Dr. Sherrian Divers. She is to follow up later this month     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    Hx: UTI (urinary tract infection) 05/06/11   Hyperlipidemia    Hypertension    PONV (postoperative nausea and vomiting)    Vulvitis 02/04/11   Yeast infection 06/19/2010     Family History  Problem Relation Age of Onset   Hypertension Mother    Heart disease Mother    Diabetes Father    Alcohol abuse Father    Diabetes Brother    Hypertension Brother    Alcohol abuse Brother    Heart attack Maternal Grandfather    Stomach cancer Maternal Grandfather    Hypertension Brother    Crohn's disease Other        mat 2nd cousin   Hyperlipidemia Other    Irritable bowel syndrome Other        mat. nephew   Colon cancer Neg Hx    Breast cancer Neg Hx     Colon polyps Neg Hx    Esophageal cancer Neg Hx    Rectal cancer Neg Hx      Current Outpatient Medications:    Adapalene 0.3 % gel, Apply 1 application topically as needed (zits). , Disp: , Rfl:    Cyanocobalamin (VITAMIN B 12 PO), Take 1 tablet by mouth daily. , Disp: , Rfl:    Evolocumab (REPATHA SURECLICK) 283 MG/ML SOAJ, Inject 1 Dose into the skin every 14 (fourteen) days., Disp: 6 mL, Rfl: 3   HYDROcodone-acetaminophen (NORCO) 7.5-325 MG tablet, Take 1-2 tablets by mouth 3 (three) times daily as needed for moderate pain., Disp: 30 tablet, Rfl: 0   ibuprofen (ADVIL) 800 MG tablet, Take 1 tablet (800 mg total) by mouth every 8 (eight) hours as needed., Disp: 30 tablet, Rfl: 0   ipratropium (ATROVENT) 0.03 % nasal spray, PLACE 1 SPRAY INTO BOTH NOSTRILS DAILY (Patient taking differently: Place 2 sprays into both nostrils daily.), Disp: 30 mL, Rfl: 1   MAGNESIUM PO, Take 1 tablet by mouth daily. , Disp: , Rfl:    Melatonin 5 MG CHEW, Chew by mouth., Disp: , Rfl:    Multiple Vitamins-Minerals (CENTRUM ADULTS PO), Take 1 tablet  by mouth daily. , Disp: , Rfl:    Omega-3 Fatty Acids (FISH OIL PO), Take 1 capsule by mouth daily. , Disp: , Rfl:    PARoxetine (PAXIL) 20 MG tablet, TAKE 1 TABLET BY MOUTH EVERY DAY (Patient taking differently: Take 10 mg by mouth daily.), Disp: 90 tablet, Rfl: 1   phentermine 37.5 MG capsule, Take 1 capsule (37.5 mg total) by mouth every morning., Disp: 30 capsule, Rfl: 1   Semaglutide,0.25 or 0.5MG /DOS, (OZEMPIC, 0.25 OR 0.5 MG/DOSE,) 2 MG/1.5ML SOPN, Inject 0.5 mg into the skin once a week., Disp: 3 mL, Rfl: 1   UNABLE TO FIND, Ashwaganda: take one tablet twice daily as needed, Disp: , Rfl:    valACYclovir (VALTREX) 1000 MG tablet, TAKE 1 TABLET BY MOUTH EVERY 12 HOURS (Patient taking differently: Take 1,000 mg by mouth every 12 (twelve) hours as needed (break out).), Disp: 180 tablet, Rfl: 1   VITAMIN D PO, Take 1 capsule by mouth daily., Disp: , Rfl:     Allergies  Allergen Reactions   Statins Other (See Comments)    myalgias   Sulfa Antibiotics Itching     Review of Systems  Constitutional:  Negative for chills and fever.  Respiratory: Negative.    Cardiovascular: Negative.  Negative for chest pain, palpitations and leg swelling.  Gastrointestinal:  Negative for vomiting.  Endocrine: Negative for polydipsia, polyphagia and polyuria.  Genitourinary:  Negative for dysuria, frequency, urgency, vaginal discharge and vaginal pain.  Neurological: Negative.  Negative for dizziness and headaches.  Psychiatric/Behavioral: Negative.      Today's Vitals   02/07/21 1058  BP: 120/78  Pulse: 83  Temp: 98.3 F (36.8 C)  Weight: 168 lb 6.4 oz (76.4 kg)  Height: 5' 6.4" (1.687 m)  PainSc: 6   PainLoc: Knee   Body mass index is 26.85 kg/m.   Objective:  Physical Exam Vitals reviewed.  Constitutional:      General: She is not in acute distress.    Appearance: Normal appearance.  Cardiovascular:     Rate and Rhythm: Normal rate and regular rhythm.     Pulses: Normal pulses.     Heart sounds: Normal heart sounds. No murmur heard. Pulmonary:     Effort: Pulmonary effort is normal. No respiratory distress.     Breath sounds: Normal breath sounds. No wheezing.  Skin:    Capillary Refill: Capillary refill takes less than 2 seconds.  Neurological:     General: No focal deficit present.     Mental Status: She is alert and oriented to person, place, and time.     Cranial Nerves: No cranial nerve deficit.     Motor: No weakness.  Psychiatric:        Mood and Affect: Mood normal.        Thought Content: Thought content normal.        Judgment: Judgment normal.        Assessment And Plan:     1. Abnormal glucose She is tolerating Ozempic well  Continue with current dose.  Will recheck her HgbA1c at next visit  2. BMI 27.0-27.9,adult  She has lost approximately 5 lbs since her last visit Encouraged to continue with healthy  diet She is unable to exercise due to her knee pain She received a lipo b injection today    Patient was given opportunity to ask questions. Patient verbalized understanding of the plan and was able to repeat key elements of the plan. All questions were answered to  their satisfaction.  Minette Brine, FNP   I, Minette Brine, FNP, have reviewed all documentation for this visit. The documentation on 02/07/21 for the exam, diagnosis, procedures, and orders are all accurate and complete.   IF YOU HAVE BEEN REFERRED TO A SPECIALIST, IT MAY TAKE 1-2 WEEKS TO SCHEDULE/PROCESS THE REFERRAL. IF YOU HAVE NOT HEARD FROM US/SPECIALIST IN TWO WEEKS, PLEASE GIVE [name] A CALL AT [phone].   THE PATIENT IS ENCOURAGED TO PRACTICE SOCIAL DISTANCING DUE TO THE COVID-19 PANDEMIC.

## 2021-02-09 ENCOUNTER — Encounter: Payer: Self-pay | Admitting: Nurse Practitioner

## 2021-02-10 ENCOUNTER — Other Ambulatory Visit: Payer: Self-pay | Admitting: Nurse Practitioner

## 2021-02-10 DIAGNOSIS — Z6829 Body mass index (BMI) 29.0-29.9, adult: Secondary | ICD-10-CM

## 2021-02-16 ENCOUNTER — Telehealth: Payer: Self-pay

## 2021-02-16 NOTE — Telephone Encounter (Signed)
I called patient to notify her that her PA for ozempic has been approved through 02/12/22. YL,RMA

## 2021-02-19 NOTE — Telephone Encounter (Signed)
PA for repatha submitted via CMM (Key: [id number])

## 2021-03-07 ENCOUNTER — Telehealth: Payer: Self-pay | Admitting: Orthopaedic Surgery

## 2021-03-07 NOTE — Telephone Encounter (Signed)
Received call from patient requesting copy of records. I advised need to sign release form, she will come in to sign and I will call when ready. Ph [phone]

## 2021-03-15 ENCOUNTER — Other Ambulatory Visit: Payer: Self-pay

## 2021-03-15 ENCOUNTER — Ambulatory Visit (INDEPENDENT_AMBULATORY_CARE_PROVIDER_SITE_OTHER): Payer: 59 | Admitting: Nurse Practitioner

## 2021-03-15 ENCOUNTER — Other Ambulatory Visit: Payer: Self-pay | Admitting: Nurse Practitioner

## 2021-03-15 ENCOUNTER — Encounter: Payer: Self-pay | Admitting: Nurse Practitioner

## 2021-03-15 VITALS — BP 124/78 | HR 61 | Temp 98.4°F | Ht 64.6 in | Wt 167.6 lb

## 2021-03-15 DIAGNOSIS — K219 Gastro-esophageal reflux disease without esophagitis: Secondary | ICD-10-CM | POA: Diagnosis not present

## 2021-03-15 DIAGNOSIS — Z23 Encounter for immunization: Secondary | ICD-10-CM

## 2021-03-15 DIAGNOSIS — R142 Eructation: Secondary | ICD-10-CM | POA: Diagnosis not present

## 2021-03-15 MED ORDER — OMEPRAZOLE MAGNESIUM 20 MG PO TBEC: 20.0000 mg | DELAYED_RELEASE_TABLET | Freq: Every day | ORAL | 1 refills | Status: DC

## 2021-03-15 NOTE — Progress Notes (Signed)
I,Yamilka Roman [employer] as a Education administrator for [employer], FNP.,have documented all relevant documentation on the behalf of Minette Brine, FNP,as directed by  Minette Brine, FNP while in the presence of Minette Brine, [place].  This visit occurred during the SARS-CoV-2 public health emergency.  Safety protocols were in place, including screening questions prior to the visit, additional usage of staff PPE, and extensive cleaning of exam room while observing appropriate contact time as indicated for disinfecting solutions.  Subjective:     Patient ID: Yvonne [name] , female    DOB: 1971/06/23 , 50 y.o.   MRN: [id number]   Chief Complaint  Patient presents with   Heartburn    HPI  Patient presents today for heartburn. She stated she has burping.  She feels like her belching has improved. She has cut out on her coffee. She is having bloating to her stomach as well. She limits her intake of dairy as well. She does drink out of straws   Wt Readings from Last 3 Encounters: 03/15/21 : 167 lb 9.6 oz (76 kg) 02/07/21 : 168 lb 6.4 oz (76.4 kg) 02/06/21 : 166 lb (75.3 kg)    Heartburn She complains of heartburn and nausea. She reports no abdominal pain or no coughing. The heartburn wakes her from sleep. The heartburn does not limit her activity. The heartburn doesn't change with position.    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    Hx: UTI (urinary tract infection) 05/06/11   Hyperlipidemia    Hypertension    PONV (postoperative nausea and vomiting)    Vulvitis 02/04/11   Yeast infection 06/19/2010     Family History  Problem Relation Age of Onset   Hypertension Mother    Heart disease Mother    Diabetes Father    Alcohol abuse Father    Diabetes Brother    Hypertension Brother    Alcohol abuse Brother    Heart attack Maternal Grandfather    Stomach cancer Maternal Grandfather     Hypertension Brother    Crohn's disease Other        mat 2nd cousin   Hyperlipidemia Other    Irritable bowel syndrome Other        mat. nephew   Colon cancer Neg Hx    Breast cancer Neg Hx    Colon polyps Neg Hx    Esophageal cancer Neg Hx    Rectal cancer Neg Hx      Current Outpatient Medications:    Adapalene 0.3 % gel, Apply 1 application topically as needed (zits). , Disp: , Rfl:    Cyanocobalamin (VITAMIN B 12 PO), Take 1 tablet by mouth daily. , Disp: , Rfl:    Evolocumab (REPATHA SURECLICK) 191 MG/ML SOAJ, Inject 1 Dose into the skin every 14 (fourteen) days. (Patient not taking: Reported on 04/12/2021), Disp: 6 mL, Rfl: 3   ipratropium (ATROVENT) 0.03 % nasal spray, PLACE 1 SPRAY INTO BOTH NOSTRILS DAILY (Patient taking differently: Place 2 sprays into both nostrils daily.), Disp: 30 mL, Rfl: 1   MAGNESIUM PO, Take 1 tablet by mouth daily.  (Patient not taking: Reported on 04/12/2021), Disp: , Rfl:    Melatonin 5 MG CHEW, Chew by mouth., Disp: , Rfl:    Multiple Vitamins-Minerals (CENTRUM ADULTS PO), Take 1 tablet by mouth daily.  (Patient not taking: Reported on 04/12/2021),  Disp: , Rfl:    Omega-3 Fatty Acids (FISH OIL PO), Take 1 capsule by mouth daily.  (Patient not taking: Reported on 04/12/2021), Disp: , Rfl:    PARoxetine (PAXIL) 20 MG tablet, TAKE 1 TABLET BY MOUTH EVERY DAY (Patient taking differently: Take 10 mg by mouth daily.), Disp: 90 tablet, Rfl: 1   UNABLE TO FIND, Ashwaganda: take one tablet twice daily as needed (Patient not taking: Reported on 04/12/2021), Disp: , Rfl:    valACYclovir (VALTREX) 1000 MG tablet, TAKE 1 TABLET BY MOUTH EVERY 12 HOURS, Disp: 180 tablet, Rfl: 1   VITAMIN D PO, Take 1 capsule by mouth daily. (Patient not taking: Reported on 04/12/2021), Disp: , Rfl:    omeprazole (PRILOSEC OTC) 20 MG tablet, TAKE 1 TABLET BY MOUTH EVERY DAY (Patient not taking: Reported on 04/12/2021), Disp: 30 tablet, Rfl: 1   OZEMPIC, 0.25 OR 0.5 MG/DOSE, 2 MG/1.5ML  SOPN, INJECT 0.5 MG INTO THE SKIN ONCE A WEEK., Disp: 1.5 mL, Rfl: 1   phentermine 37.5 MG capsule, Take 1 capsule (37.5 mg total) by mouth every morning., Disp: 30 capsule, Rfl: 1   Allergies  Allergen Reactions   Statins Other (See Comments)    myalgias   Sulfa Antibiotics Itching     Review of Systems  Constitutional: Negative.   Eyes: Negative.   Respiratory:  Negative for cough.   Cardiovascular: Negative.   Gastrointestinal:  Positive for abdominal distention, constipation, heartburn and nausea. Negative for abdominal pain and vomiting.  Musculoskeletal: Negative.   Skin: Negative.   Psychiatric/Behavioral: Negative.      Today's Vitals   03/15/21 1142  BP: 124/78  Pulse: 61  Temp: 98.4 F (36.9 C)  Weight: 167 lb 9.6 oz (76 kg)  Height: 5' 4.6" (1.641 m)  PainSc: 0-No pain   Body mass index is 28.24 kg/m.   Objective:  Physical Exam Vitals reviewed.  Constitutional:      General: She is not in acute distress.    Appearance: Normal appearance.  Cardiovascular:     Rate and Rhythm: Normal rate and regular rhythm.     Pulses: Normal pulses.     Heart sounds: Normal heart sounds. No murmur heard. Pulmonary:     Effort: Pulmonary effort is normal. No respiratory distress.     Breath sounds: Normal breath sounds. No wheezing.  Abdominal:     General: Abdomen is flat. There is no distension.     Palpations: Abdomen is soft. There is no mass.     Tenderness: There is no abdominal tenderness.     Comments: Belching noted during visit  Skin:    Capillary Refill: Capillary refill takes less than 2 seconds.  Neurological:     General: No focal deficit present.     Mental Status: She is alert and oriented to person, place, and time.     Cranial Nerves: No cranial nerve deficit.     Motor: No weakness.  Psychiatric:        Mood and Affect: Mood normal.        Thought Content: Thought content normal.        Judgment: Judgment normal.        Assessment And  Plan:     1. Gastroesophageal reflux disease, unspecified whether esophagitis present Comments: Will treat with omeprazole Encouraged to take a probiotic daily  2. Belching  3. Encounter for immunization - Varicella-zoster vaccine IM (Shingrix)    Patient was given opportunity to ask questions. Patient verbalized  understanding of the plan and was able to repeat key elements of the plan. All questions were answered to their satisfaction.  Minette Brine, FNP   I, Minette Brine, FNP, have reviewed all documentation for this visit. The documentation on 03/15/21 for the exam, diagnosis, procedures, and orders are all accurate and complete.   IF YOU HAVE BEEN REFERRED TO A SPECIALIST, IT MAY TAKE 1-2 WEEKS TO SCHEDULE/PROCESS THE REFERRAL. IF YOU HAVE NOT HEARD FROM US/SPECIALIST IN TWO WEEKS, PLEASE GIVE [name] A CALL AT [phone].   THE PATIENT IS ENCOURAGED TO PRACTICE SOCIAL DISTANCING DUE TO THE COVID-19 PANDEMIC.

## 2021-03-22 ENCOUNTER — Telehealth: Payer: Self-pay

## 2021-03-22 NOTE — Telephone Encounter (Signed)
Pt would like a copy of her xrays. I told pt she would have to get the copy of her MRI from [facility].

## 2021-03-23 ENCOUNTER — Telehealth: Payer: Self-pay | Admitting: Internal Medicine

## 2021-03-23 ENCOUNTER — Encounter: Payer: Self-pay | Admitting: Nurse Practitioner

## 2021-03-23 NOTE — Telephone Encounter (Signed)
Called patient. She did not have any Xrays done in our office.

## 2021-03-23 NOTE — Telephone Encounter (Signed)
PA for repatha that was submitted via CMM in June was cancelled for unknown reason and without notification. Called patient's insurance company and was able to re-initiate a PA request. Faxed clinical notes to [phone]  Ref: [id number]

## 2021-03-27 ENCOUNTER — Other Ambulatory Visit: Payer: Self-pay | Admitting: Nurse Practitioner

## 2021-03-27 DIAGNOSIS — Z6829 Body mass index (BMI) 29.0-29.9, adult: Secondary | ICD-10-CM

## 2021-03-27 MED ORDER — PHENTERMINE HCL 37.5 MG PO CAPS
37.5000 mg | ORAL_CAPSULE | Freq: Every morning | ORAL | 1 refills | Status: DC
Start: 2021-03-27 — End: 2021-07-10

## 2021-03-29 ENCOUNTER — Encounter: Payer: Self-pay | Admitting: Nurse Practitioner

## 2021-04-02 NOTE — Telephone Encounter (Signed)
Patient approved for coverage of Repatha [employer] from [id number] - 03/27/2022

## 2021-04-12 ENCOUNTER — Encounter: Payer: Self-pay | Admitting: Nurse Practitioner

## 2021-04-12 ENCOUNTER — Ambulatory Visit (INDEPENDENT_AMBULATORY_CARE_PROVIDER_SITE_OTHER): Payer: 59 | Admitting: Nurse Practitioner

## 2021-04-12 ENCOUNTER — Other Ambulatory Visit: Payer: Self-pay

## 2021-04-12 VITALS — BP 120/78 | HR 82 | Temp 98.4°F | Ht 64.6 in | Wt 164.4 lb

## 2021-04-12 DIAGNOSIS — Z6829 Body mass index (BMI) 29.0-29.9, adult: Secondary | ICD-10-CM | POA: Diagnosis not present

## 2021-04-12 DIAGNOSIS — R7309 Other abnormal glucose: Secondary | ICD-10-CM | POA: Diagnosis not present

## 2021-04-12 DIAGNOSIS — R142 Eructation: Secondary | ICD-10-CM | POA: Diagnosis not present

## 2021-04-12 NOTE — Progress Notes (Signed)
I,Katawbba Wiggins,acting as a Education administrator for [employer], FNP.,have documented all relevant documentation on the behalf of Minette Brine, FNP,as directed by  Minette Brine, FNP while in the presence of Minette Brine, [place].  This visit occurred during the SARS-CoV-2 public health emergency.  Safety protocols were in place, including screening questions prior to the visit, additional usage of staff PPE, and extensive cleaning of exam room while observing appropriate contact time as indicated for disinfecting solutions.  Subjective:     Patient ID: Yvonne [name] , female    DOB: 11-26-1970 , 50 y.o.   MRN: [id number]   Chief Complaint  Patient presents with   Obesity    HPI  Here for weight check. Continues with phentermine 15 mg daily. She is also tolerating ozempic well. She continues to have belching with slight improvement but not enough to be comfortable with the frequency  Wt Readings from Last 3 Encounters: 04/12/21 : 164 lb 6.4 oz (74.6 kg) 03/15/21 : 167 lb 9.6 oz (76 kg) 02/07/21 : 168 lb 6.4 oz (76.4 kg)      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    Hx: UTI (urinary tract infection) 05/06/11   Hyperlipidemia    Hypertension    PONV (postoperative nausea and vomiting)    Vulvitis 02/04/11   Yeast infection 06/19/2010     Family History  Problem Relation Age of Onset   Hypertension Mother    Heart disease Mother    Diabetes Father    Alcohol abuse Father    Diabetes Brother    Hypertension Brother    Alcohol abuse Brother    Heart attack Maternal Grandfather    Stomach cancer Maternal Grandfather    Hypertension Brother    Crohn's disease Other        mat 2nd cousin   Hyperlipidemia Other    Irritable bowel syndrome Other        mat. nephew   Colon cancer Neg Hx    Breast cancer Neg Hx    Colon polyps Neg Hx    Esophageal cancer Neg Hx    Rectal cancer Neg Hx       Current Outpatient Medications:    Adapalene 0.3 % gel, Apply 1 application topically as needed (zits). , Disp: , Rfl:    Cyanocobalamin (VITAMIN B 12 PO), Take 1 tablet by mouth daily. , Disp: , Rfl:    ipratropium (ATROVENT) 0.03 % nasal spray, PLACE 1 SPRAY INTO BOTH NOSTRILS DAILY (Patient taking differently: Place 2 sprays into both nostrils daily.), Disp: 30 mL, Rfl: 1   Melatonin 5 MG CHEW, Chew by mouth., Disp: , Rfl:    PARoxetine (PAXIL) 20 MG tablet, TAKE 1 TABLET BY MOUTH EVERY DAY (Patient taking differently: Take 10 mg by mouth daily.), Disp: 90 tablet, Rfl: 1   phentermine 37.5 MG capsule, Take 1 capsule (37.5 mg total) by mouth every morning., Disp: 30 capsule, Rfl: 1   Semaglutide,0.25 or 0.5MG/DOS, (OZEMPIC, 0.25 OR 0.5 MG/DOSE,) 2 MG/1.5ML SOPN, Inject 0.5 mg into the skin once a week., Disp: 3 mL, Rfl: 1   valACYclovir (VALTREX) 1000 MG tablet, TAKE 1 TABLET BY MOUTH EVERY 12 HOURS, Disp: 180 tablet, Rfl: 1   Evolocumab (REPATHA SURECLICK) 458 MG/ML SOAJ, Inject 1 Dose into the skin every 14 (fourteen) days. (Patient not taking: Reported on 04/12/2021),  Disp: 6 mL, Rfl: 3   MAGNESIUM PO, Take 1 tablet by mouth daily.  (Patient not taking: Reported on 04/12/2021), Disp: , Rfl:    Multiple Vitamins-Minerals (CENTRUM ADULTS PO), Take 1 tablet by mouth daily.  (Patient not taking: Reported on 04/12/2021), Disp: , Rfl:    Omega-3 Fatty Acids (FISH OIL PO), Take 1 capsule by mouth daily.  (Patient not taking: Reported on 04/12/2021), Disp: , Rfl:    omeprazole (PRILOSEC OTC) 20 MG tablet, TAKE 1 TABLET BY MOUTH EVERY DAY (Patient not taking: Reported on 04/12/2021), Disp: 30 tablet, Rfl: 1   UNABLE TO FIND, Ashwaganda: take one tablet twice daily as needed (Patient not taking: Reported on 04/12/2021), Disp: , Rfl:    VITAMIN D PO, Take 1 capsule by mouth daily. (Patient not taking: Reported on 04/12/2021), Disp: , Rfl:    Allergies  Allergen Reactions   Statins Other (See Comments)     myalgias   Sulfa Antibiotics Itching     Review of Systems  Constitutional: Negative.   Eyes: Negative.   Respiratory:  Negative for cough.   Cardiovascular: Negative.   Gastrointestinal:  Positive for abdominal distention, constipation and nausea. Negative for abdominal pain and vomiting.  Musculoskeletal: Negative.   Skin: Negative.   Psychiatric/Behavioral: Negative.      Today's Vitals   04/12/21 1122  BP: 120/78  Pulse: 82  Temp: 98.4 F (36.9 C)  TempSrc: Oral  Weight: 164 lb 6.4 oz (74.6 kg)  Height: 5' 4.6" (1.641 m)   Body mass index is 27.7 kg/m.  Wt Readings from Last 3 Encounters:  04/12/21 164 lb 6.4 oz (74.6 kg)  03/15/21 167 lb 9.6 oz (76 kg)  02/07/21 168 lb 6.4 oz (76.4 kg)    BP Readings from Last 3 Encounters:  04/12/21 120/78  03/15/21 124/78  02/07/21 120/78    Objective:  Physical Exam Vitals reviewed.  Constitutional:      General: She is not in acute distress.    Appearance: Normal appearance.  Cardiovascular:     Rate and Rhythm: Normal rate and regular rhythm.     Pulses: Normal pulses.     Heart sounds: Normal heart sounds. No murmur heard. Pulmonary:     Effort: Pulmonary effort is normal. No respiratory distress.     Breath sounds: Normal breath sounds. No wheezing.  Skin:    Capillary Refill: Capillary refill takes less than 2 seconds.  Neurological:     General: No focal deficit present.     Mental Status: She is alert and oriented to person, place, and time.     Cranial Nerves: No cranial nerve deficit.     Motor: No weakness.  Psychiatric:        Mood and Affect: Mood normal.        Thought Content: Thought content normal.        Judgment: Judgment normal.        Assessment And Plan:     1. Belching Comments: continues to have belching in spite of omeprazole and probiotic Also cut out on fried, fatty and spicy foods Will refer to Dr. Tarri Glenn for further evaluation - Ambulatory referral to Gastroenterology  2.  Abnormal glucose Comments: Slightly elevated at last visit, tolerating Ozempic well Will check HgbA1c today Discussed difference between diabetes and prediabetes She has stopped all her medications except for omeprazole and probiotic, phentermine and Ozempic.  - Hemoglobin A1c - BMP8+eGFR  3. BMI 29.0-29.9,adult Comments: Continue phentermine 37 mg daily  Continue with healthy diet    Patient was given opportunity to ask questions. Patient verbalized understanding of the plan and was able to repeat key elements of the plan. All questions were answered to their satisfaction.  Minette Brine, FNP   I, Minette Brine, FNP, have reviewed all documentation for this visit. The documentation on 04/12/21 for the exam, diagnosis, procedures, and orders are all accurate and complete.   IF YOU HAVE BEEN REFERRED TO A SPECIALIST, IT MAY TAKE 1-2 WEEKS TO SCHEDULE/PROCESS THE REFERRAL. IF YOU HAVE NOT HEARD FROM US/SPECIALIST IN TWO WEEKS, PLEASE GIVE [name] A CALL AT [phone].   THE PATIENT IS ENCOURAGED TO PRACTICE SOCIAL DISTANCING DUE TO THE COVID-19 PANDEMIC.

## 2021-04-12 NOTE — Patient Instructions (Signed)
Exercising to Lose Weight Exercise is structured, repetitive physical activity to improve fitness and health. Getting regular exercise is important for everyone. It is especially important if you are overweight. Being overweight increases your risk of heart disease, stroke, diabetes, high blood pressure, and several types of cancer.Reducing your calorie intake and exercising can help you lose weight. Exercise is usually categorized as moderate or vigorous intensity. To lose weight, most people need to do a certain amount of moderate-intensity orvigorous-intensity exercise each week. Moderate-intensity exercise  Moderate-intensity exercise is any activity that gets you moving enough to burn at least three times more energy (calories) than if you were sitting. Examples of moderate exercise include: Walking a mile in 15 minutes. Doing light yard work. Biking at an easy pace. Most people should get at least 150 minutes (2 hours and 30 minutes) a week ofmoderate-intensity exercise to maintain their body weight. Vigorous-intensity exercise Vigorous-intensity exercise is any activity that gets you moving enough to burn at least six times more calories than if you were sitting. When you exercise at this intensity, you should be working hard enough that you are not able tocarry on a conversation. Examples of vigorous exercise include: Running. Playing a team sport, such as football, basketball, and soccer. Jumping rope. Most people should get at least 75 minutes (1 hour and 15 minutes) a week ofvigorous-intensity exercise to maintain their body weight. How can exercise affect me? When you exercise enough to burn more calories than you eat, you lose weight. Exercise also reduces body fat and builds muscle. The more muscle you have, the more calories you burn. Exercise also: Improves mood. Reduces stress and tension. Improves your overall fitness, flexibility, and endurance. Increases bone strength. The  amount of exercise you need to lose weight depends on: Your age. The type of exercise. Any health conditions you have. Your overall physical ability. Talk to your health care provider about how much exercise you need and whattypes of activities are safe for you. What actions can I take to lose weight? Nutrition  Make changes to your diet as told by your health care provider or diet and nutrition specialist (dietitian). This may include: Eating fewer calories. Eating more protein. Eating less unhealthy fats. Eating a diet that includes fresh fruits and vegetables, whole grains, low-fat dairy products, and lean protein. Avoiding foods with added fat, salt, and sugar. Drink plenty of water while you exercise to prevent dehydration or heat stroke.  Activity Choose an activity that you enjoy and set realistic goals. Your health care provider can help you make an exercise plan that works for you. Exercise at a moderate or vigorous intensity most days of the week. The intensity of exercise may vary from person to person. You can tell how intense a workout is for you by paying attention to your breathing and heartbeat. Most people will notice their breathing and heartbeat get faster with more intense exercise. Do resistance training twice each week, such as: Push-ups. Sit-ups. Lifting weights. Using resistance bands. Getting short amounts of exercise can be just as helpful as long structured periods of exercise. If you have trouble finding time to exercise, try to include exercise in your daily routine. Get up, stretch, and walk around every 30 minutes throughout the day. Go for a walk during your lunch break. Park your car farther away from your destination. If you take public transportation, get off one stop early and walk the rest of the way. Make phone calls while standing up and   walking around. Take the stairs instead of elevators or escalators. Wear comfortable clothes and shoes with  good support. Do not exercise so much that you hurt yourself, feel dizzy, or get very short of breath. Where to find more information U.S. Department of Health and Human Services: www.hhs.gov Centers for Disease Control and Prevention (CDC): www.cdc.gov Contact a health care provider: Before starting a new exercise program. If you have questions or concerns about your weight. If you have a medical problem that keeps you from exercising. Get help right away if you have any of the following while exercising: Injury. Dizziness. Difficulty breathing or shortness of breath that does not go away when you stop exercising. Chest pain. Rapid heartbeat. Summary Being overweight increases your risk of heart disease, stroke, diabetes, high blood pressure, and several types of cancer. Losing weight happens when you burn more calories than you eat. Reducing the amount of calories you eat in addition to getting regular moderate or vigorous exercise each week helps you lose weight. This information is not intended to replace advice given to you by your health care provider. Make sure you discuss any questions you have with your healthcare provider. Document Revised: 11/22/2019 Document Reviewed: 12/09/2019 Elsevier Patient Education  2022 [employer]  

## 2021-04-13 ENCOUNTER — Other Ambulatory Visit: Payer: Self-pay | Admitting: Nurse Practitioner

## 2021-04-13 DIAGNOSIS — R7309 Other abnormal glucose: Secondary | ICD-10-CM

## 2021-04-13 LAB — HEMOGLOBIN A1C
Est. average glucose Bld gHb Est-mCnc: 117 mg/dL
Hgb A1c MFr Bld: 5.7 % — ABNORMAL HIGH (ref 4.8–5.6)

## 2021-04-13 LAB — BMP8+EGFR
BUN/Creatinine Ratio: 15 (ref 9–23)
BUN: 11 mg/dL (ref 6–24)
CO2: 22 mmol/L (ref 20–29)
Calcium: 9.8 mg/dL (ref 8.7–10.2)
Chloride: 103 mmol/L (ref 96–106)
Creatinine, Ser: 0.74 mg/dL (ref 0.57–1.00)
Glucose: 99 mg/dL (ref 65–99)
Potassium: 4.5 mmol/L (ref 3.5–5.2)
Sodium: 140 mmol/L (ref 134–144)
eGFR: 97 mL/min/{1.73_m2} (ref >59)

## 2021-04-20 MED ORDER — SHINGRIX 50 MCG/0.5ML IM SUSR: 0.5000 mL | Freq: Once | INTRAMUSCULAR | 0 refills | Status: AC

## 2021-04-23 ENCOUNTER — Other Ambulatory Visit: Payer: Self-pay | Admitting: *Deleted

## 2021-04-23 ENCOUNTER — Telehealth: Payer: Self-pay | Admitting: Internal Medicine

## 2021-04-23 ENCOUNTER — Inpatient Hospital Stay: Admission: RE | Admit: 2021-04-23 | Payer: 59 | Source: Ambulatory Visit

## 2021-04-23 DIAGNOSIS — R911 Solitary pulmonary nodule: Secondary | ICD-10-CM

## 2021-04-23 NOTE — Progress Notes (Signed)
Per pt chest ct is not covered at [facility] by insurance  Order reentered for [employer] CT at [address] at pt's request .pt to call back if no one calls to schedule ct ./cy

## 2021-04-23 NOTE — Telephone Encounter (Signed)
Patient states she was supposed to have a follow up imaging test done today (04/23/21) as a follow up to the CT done 10/20/20. She was supposed to have it done at [facility]. She was told on Friday by [facility] that they were out of network and her insurance would not cover the test that was supposed to be scheduled.   The patient needs to know what to do to have the proper test ordered at a location that her insurance covers.  Please advise

## 2021-04-25 ENCOUNTER — Other Ambulatory Visit: Payer: Self-pay | Admitting: Nurse Practitioner

## 2021-04-25 ENCOUNTER — Encounter: Payer: Self-pay | Admitting: Nurse Practitioner

## 2021-04-25 DIAGNOSIS — R142 Eructation: Secondary | ICD-10-CM

## 2021-04-25 MED ORDER — RESTORA PO CAPS: 1.0000 | ORAL_CAPSULE | Freq: Every day | ORAL | 1 refills | Status: DC

## 2021-05-01 ENCOUNTER — Ambulatory Visit (INDEPENDENT_AMBULATORY_CARE_PROVIDER_SITE_OTHER)
Admission: RE | Admit: 2021-05-01 | Discharge: 2021-05-01 | Disposition: A | Discharge status: Home / Self Care | Payer: 59 | Source: Ambulatory Visit | Attending: Internal Medicine | Admitting: Internal Medicine

## 2021-05-01 ENCOUNTER — Other Ambulatory Visit: Payer: Self-pay

## 2021-05-01 DIAGNOSIS — R911 Solitary pulmonary nodule: Secondary | ICD-10-CM | POA: Diagnosis not present

## 2021-05-02 ENCOUNTER — Other Ambulatory Visit: Payer: Self-pay | Admitting: Nurse Practitioner

## 2021-05-02 DIAGNOSIS — R232 Flushing: Secondary | ICD-10-CM

## 2021-05-10 ENCOUNTER — Telehealth: Payer: 59 | Admitting: Internal Medicine

## 2021-05-29 ENCOUNTER — Other Ambulatory Visit: Payer: Self-pay | Admitting: Nurse Practitioner

## 2021-05-29 DIAGNOSIS — R7309 Other abnormal glucose: Secondary | ICD-10-CM

## 2021-06-24 LAB — NMR, LIPOPROFILE
Cholesterol, Total: 212 mg/dL — ABNORMAL HIGH (ref 100–199)
HDL Particle Number: 42.7 umol/L (ref >=30.5)
HDL-C: 79 mg/dL (ref >39)
LDL Particle Number: 1364 nmol/L — ABNORMAL HIGH (ref <1000)
LDL Size: 21.7 nm (ref >20.5)
LDL-C (NIH Calc): 122 mg/dL — ABNORMAL HIGH (ref 0–99)
LP-IR Score: <25 (ref <=45)
Small LDL Particle Number: 396 nmol/L (ref <=527)
Triglycerides: 59 mg/dL (ref 0–149)

## 2021-06-25 LAB — LIPOPROTEIN A (LPA): Lipoprotein (a): 362.4 nmol/L — ABNORMAL HIGH (ref <75.0)

## 2021-06-28 ENCOUNTER — Encounter: Payer: Self-pay | Admitting: Internal Medicine

## 2021-06-28 ENCOUNTER — Other Ambulatory Visit: Payer: Self-pay | Admitting: *Deleted

## 2021-06-28 ENCOUNTER — Telehealth (INDEPENDENT_AMBULATORY_CARE_PROVIDER_SITE_OTHER): Payer: 59 | Admitting: Internal Medicine

## 2021-06-28 VITALS — BP 145/94 | HR 70 | Wt 157.0 lb

## 2021-06-28 DIAGNOSIS — R911 Solitary pulmonary nodule: Secondary | ICD-10-CM

## 2021-06-28 DIAGNOSIS — E7849 Other hyperlipidemia: Secondary | ICD-10-CM

## 2021-06-28 DIAGNOSIS — E7841 Elevated Lipoprotein(a): Secondary | ICD-10-CM | POA: Diagnosis not present

## 2021-06-28 MED ORDER — ROSUVASTATIN CALCIUM 5 MG PO TABS: 5.0000 mg | ORAL_TABLET | Freq: Every day | ORAL | 3 refills | Status: DC

## 2021-06-28 NOTE — Patient Instructions (Signed)
Medication Instructions:  START crestor 5mg  daily  *If you need a refill on your cardiac medications before your next appointment, please call your pharmacy*   Lab Work: FASTING lab work to check cholesterol in 3-4 months -- please complete about 1 week before your next visit   If you have labs (blood work) drawn today and your tests are completely normal, you will receive your results only by: [place] (if you have MyChart) OR A paper copy in the mail If you have any lab test that is abnormal or we need to change your treatment, we will call you to review the results.   Testing/Procedures: Chest CT in September 2023   Follow-Up: At [facility], you and your health needs are our priority.  As part of our continuing mission to provide you with exceptional heart care, we have created designated Provider Care Teams.  These Care Teams include your primary Cardiologist (physician) and Advanced Practice Providers (APPs -  Physician Assistants and Nurse Practitioners) who all work together to provide you with the care you need, when you need it.  We recommend signing up for the patient portal called "MyChart".  Sign up information is provided on this After Visit Summary.  MyChart is used to connect with patients for Virtual Visits (Telemedicine).  Patients are able to view lab/test results, encounter notes, upcoming appointments, etc.  Non-urgent messages can be sent to your provider as well.   To learn more about what you can do with MyChart, go to [email or web address].    Your next appointment:   3-4 month(s) - lipid clinic  The format for your next appointment:   In Person  Provider:   K. Mali Hilty, MD   Other Instructions

## 2021-06-28 NOTE — Progress Notes (Signed)
Virtual Visit via Video Note   This visit type was conducted due to national recommendations for restrictions regarding the COVID-19 Pandemic (e.g. social distancing) in an effort to limit this patient's exposure and mitigate transmission in our community.  Due to her co-morbid illnesses, this patient is at least at moderate risk for complications without adequate follow up.  This format is felt to be most appropriate for this patient at this time.  All issues noted in this document were discussed and addressed.  A limited physical exam was performed with this format.  Please refer to the patient's chart for her consent to telehealth for [facility].      Date:  06/28/2021   ID:  Yvonne [name], DOB 1971/08/23, MRN [id number] The patient was identified using 2 identifiers.  Evaluation Performed:  Follow-Up Visit  Patient Location:  [place] [id number]  Provider location:   [address], [address] [place], [place] [address]  PCP:  Minette Brine, FNP  Cardiologist:  None Electrophysiologist:  None   Chief Complaint:  Follow-up dyslipidemia  History of Present Illness:    Yvonne [name] is a 50 y.o. female who presents via audio/video conferencing for a telehealth visit today.  This is a pleasant female kindly referred for evaluation and management of dyslipidemia.  She reports history of elevated cholesterol however more recently her cholesterol has trended upwards.  This is also notable to be associated with weight gain, less activity due to struggles with left knee pain (she has an upcoming repeat knee surgery) and an atherogenic diet.  She reports a family history of heart disease including her maternal grandfather had an MI in his 72s and her mother who had high cholesterol and is of heart care patient.  Also more recently she tried a ketogenic diet.  This seems to correlate with her increasing cholesterol particularly her LDL.  Her most recent lipid  profile showed total cholesterol 294, triglycerides 85, HDL 78 and LDL 202.  Hemoglobin A1c was 6.1.  She is not on treatment for diabetes or any medications for that.  02/06/21  [name] was seen today for follow-up.  She underwent coronary calcium scoring in February 2022 which showed his calcium score is 0.  Incidentally there was a 6 mm nodule noted in the right middle lobe.  A repeat contrast chest CT was recommended in 6 months and that will be due in the end of August/2022.  She was planning to work on dietary changes and lifestyle modifications in order to lower her cholesterol further.  She has subsequently lost more than 10 pounds and has made significant dietary changes.  This, her cholesterol remains elevated with total 272, triglyceride 55, HDL 73 and LDL 191.  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  06/28/2021  [name] returns today for video follow-up.  She has responded to [place] but less than expected.  LDL particle number now 1003 and 64, LDL-C1 22, HDL 79 and triglycerides 59.  Previous LDL was 191.  A less than expected response may be due to high LP(a) and in fact she does have a high LP(a) at 362.4.  I discussed this with her today.  She was also noted to have a nodule on her CT.  This was recently reassessed and found to be stable by CT at a 9-month interval.  We will plan to reassess that in 1 year to ensure stability and hopefully will continue to be benign.  The  patient does not have symptoms concerning for COVID-19 infection (fever, chills, cough, or new SHORTNESS OF BREATH).    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    Hx: UTI (urinary tract infection) 05/06/11   Hyperlipidemia    Hypertension    PONV (postoperative nausea and vomiting)     Vulvitis 02/04/11   Yeast infection 06/19/2010    Past Surgical History:  Procedure Laterality Date   ABDOMINAL HYSTERECTOMY     CESAREAN SECTION  2008 & 2009   COLONOSCOPY  11/24/2019   CRYOTHERAPY  2009   CYSTOSCOPY  06/15/2012   Procedure: CYSTOSCOPY;  Surgeon: Delice Lesch, MD;  Location: [facility];  Service: Gynecology;  Laterality: N/A;   KNEE ARTHROSCOPY Left 05/16/2020   Procedure: LEFT KNEE ARTHROSCOPY, PARTIAL MENISCECTOMY;  Surgeon: Melrose Nakayama, MD;  Location: [facility];  Service: Orthopedics;  Laterality: Left;   KNEE ARTHROSCOPY WITH MEDIAL MENISECTOMY Left 10/13/2020   Procedure: LEFT KNEE ARTHROSCOPY WITH PARTIAL MEDIAL MENISCECTOMY;  Surgeon: Leandrew Koyanagi, MD;  Location: [place] [facility];  Service: Orthopedics;  Laterality: Left;   LAPAROSCOPIC HYSTERECTOMY  06/15/2012   Procedure: HYSTERECTOMY TOTAL LAPAROSCOPIC;  Surgeon: Delice Lesch, MD;  Location: [facility];  Service: Gynecology;  Laterality: N/A;   MYOMECTOMY  2005   TUBAL LIGATION     bilateral    FAMHx:  Family History  Problem Relation Age of Onset   Hypertension Mother    Heart disease Mother    Diabetes Father    Alcohol abuse Father    Diabetes Brother    Hypertension Brother    Alcohol abuse Brother    Heart attack Maternal Grandfather    Stomach cancer Maternal Grandfather    Hypertension Brother    Crohn's disease Other        mat 2nd cousin   Hyperlipidemia Other    Irritable bowel syndrome Other        mat. nephew   Colon cancer Neg Hx    Breast cancer Neg Hx    Colon polyps Neg Hx    Esophageal cancer Neg Hx    Rectal cancer Neg Hx     SOCHx:   reports that she has never smoked. She has never used smokeless tobacco. She reports current alcohol use of about 6.0 standard drinks per week. She reports that she does not use drugs.  ALLERGIES:  Allergies  Allergen Reactions   Statins Other (See Comments)    myalgias   Sulfa Antibiotics Itching    MEDS:  Current Meds   Medication Sig   Adapalene 0.3 % gel Apply 1 application topically as needed (zits).    Cyanocobalamin (VITAMIN B 12 PO) Take 1 tablet by mouth daily.    Evolocumab (REPATHA SURECLICK) 657 MG/ML SOAJ Inject 1 Dose into the skin every 14 (fourteen) days.   ipratropium (ATROVENT) 0.03 % nasal spray PLACE 1 SPRAY INTO BOTH NOSTRILS DAILY (Patient taking differently: Place 2 sprays into both nostrils daily.)   MAGNESIUM PO Take 1 tablet by mouth daily.   Melatonin 5 MG CHEW Chew by mouth as needed.   Multiple Vitamins-Minerals (CENTRUM ADULTS PO) Take 1 tablet by mouth daily.   Omega-3 Fatty Acids (FISH OIL PO) Take 1 capsule  by mouth daily.   omeprazole (PRILOSEC OTC) 20 MG tablet TAKE 1 TABLET BY MOUTH EVERY DAY   OZEMPIC, 0.25 OR 0.5 MG/DOSE, 2 MG/1.5ML SOPN INJECT 0.5 MG INTO THE SKIN ONCE A WEEK.   PARoxetine (PAXIL) 20 MG tablet TAKE 1 TABLET BY MOUTH EVERY DAY (Patient taking differently: Take 10 mg by mouth daily.)   phentermine 37.5 MG capsule Take 1 capsule (37.5 mg total) by mouth every morning.     ROS: Pertinent items noted in HPI and remainder of comprehensive ROS otherwise negative.  Labs/Other Tests and Data Reviewed:    Recent Labs: 07/18/2020: ALT 16; Hemoglobin 14.1; Platelets 275 04/12/2021: BUN 11; Creatinine, Ser 0.74; Potassium 4.5; Sodium 140   Recent Lipid Panel Lab Results  Component Value Date/Time   CHOL 272 (H) 02/02/2021 09:58 AM   TRIG 55 02/02/2021 09:58 AM   HDL 73 02/02/2021 09:58 AM   CHOLHDL 3.7 02/02/2021 09:58 AM   LDLCALC 191 (H) 02/02/2021 09:58 AM    Wt Readings from Last 3 Encounters:  06/28/21 157 lb (71.2 kg)  04/12/21 164 lb 6.4 oz (74.6 kg)  03/15/21 167 lb 9.6 oz (76 kg)     Exam:    Vital Signs:  BP (!) 145/94   Pulse 70   Wt 157 lb (71.2 kg)   LMP 05/25/2012   BMI 26.45 kg/m    General appearance: alert and no distress Lungs: No visual respiratory difficulty Abdomen: Mildly overweight Extremities: extremities normal,  atraumatic, no cyanosis or edema Skin: Skin color, texture, turgor normal. No rashes or lesions Neurologic: Grossly normal Psych: Pleasant  ASSESSMENT & PLAN:    Dyslipidemia with LDL greater than 190, suggestive of dyslipidemia Family history of coronary disease Zero coronary artery calcium (09/2020) Subcentimeter right middle lobe pulmonary nodule (09/2020) Statin intolerance-myalgias High LP(a)-362.4  Ms. Tiedeman has a high LP(a) which likely represents a suboptimal response to Repatha.  Nonetheless her LDL is now down to 122.  She is only taken 3 doses but this should be what is expected.  I would recommend she continue on that but she will need some additional assistance.  She tried to statins in the past and had myalgias with them.  I suspect at higher doses.  She is willing to try low-dose rosuvastatin 5 mg daily which can give her up to 35% more lipid-lowering.  We will start that and plan repeat lipid NMR in about 3 months.  Her repeat chest CT has been ordered for 1 year to follow-up on her nodule which has been stable.  COVID-19 Education: The signs and symptoms of COVID-19 were discussed with the patient and how to seek care for testing (follow up with PCP or arrange E-visit).  The importance of social distancing was discussed today.  Patient Risk:   After full review of this patients clinical status, I feel that they are at least moderate risk at this time.  Time:   Today, I have spent 25 minutes with the patient with telehealth technology discussing dyslipidemia, FH, statin intolerance.     Medication Adjustments/Labs and Tests Ordered: Current medicines are reviewed at length with the patient today.  Concerns regarding medicines are outlined above.   Tests Ordered: No orders of the defined types were placed in this encounter.   Medication Changes: No orders of the defined types were placed in this encounter.   Disposition:  in 3 month(s)  Pixie Casino, MD, [facility],  [place]  Director of the [place] of the American Board of Clinical Lipidology Attending Cardiologist  Direct Dial: [phone]  Fax: [phone]  Website:  [email or web address]  Pixie Casino, MD  06/28/2021 9:43 AM

## 2021-06-29 ENCOUNTER — Other Ambulatory Visit: Payer: Self-pay | Admitting: Nurse Practitioner

## 2021-06-29 DIAGNOSIS — Z6829 Body mass index (BMI) 29.0-29.9, adult: Secondary | ICD-10-CM

## 2021-07-04 ENCOUNTER — Encounter: Payer: Self-pay | Admitting: Nurse Practitioner

## 2021-07-10 ENCOUNTER — Other Ambulatory Visit: Payer: Self-pay

## 2021-07-10 ENCOUNTER — Encounter: Payer: Self-pay | Admitting: Nurse Practitioner

## 2021-07-10 ENCOUNTER — Other Ambulatory Visit: Payer: Self-pay | Admitting: Nurse Practitioner

## 2021-07-10 ENCOUNTER — Ambulatory Visit (INDEPENDENT_AMBULATORY_CARE_PROVIDER_SITE_OTHER): Payer: 59 | Admitting: Nurse Practitioner

## 2021-07-10 VITALS — BP 120/84 | HR 69 | Temp 98.2°F | Ht 64.0 in | Wt 161.2 lb

## 2021-07-10 DIAGNOSIS — R7309 Other abnormal glucose: Secondary | ICD-10-CM

## 2021-07-10 DIAGNOSIS — Z6825 Body mass index (BMI) 25.0-25.9, adult: Secondary | ICD-10-CM | POA: Diagnosis not present

## 2021-07-10 DIAGNOSIS — Z23 Encounter for immunization: Secondary | ICD-10-CM

## 2021-07-10 DIAGNOSIS — Z6827 Body mass index (BMI) 27.0-27.9, adult: Secondary | ICD-10-CM

## 2021-07-10 DIAGNOSIS — Z6829 Body mass index (BMI) 29.0-29.9, adult: Secondary | ICD-10-CM

## 2021-07-10 MED ORDER — PHENTERMINE HCL 37.5 MG PO CAPS
37.5000 mg | ORAL_CAPSULE | Freq: Every morning | ORAL | 1 refills | Status: DC
Start: 2021-07-10 — End: 2021-09-14

## 2021-07-10 MED ORDER — OZEMPIC (1 MG/DOSE) 4 MG/3ML ~~LOC~~ SOPN: 1.0000 mg | PEN_INJECTOR | SUBCUTANEOUS | 1 refills | Status: DC

## 2021-07-10 NOTE — Patient Instructions (Signed)

## 2021-07-10 NOTE — Progress Notes (Signed)
I,Victoria T Hamilton,acting as a Education administrator for Minette Brine, FNP.,have documented all relevant documentation on the behalf of Minette Brine, FNP,as directed by  Minette Brine, FNP while in the presence of Minette Brine, [place].  This visit occurred during the SARS-CoV-2 public health emergency.  Safety protocols were in place, including screening questions prior to the visit, additional usage of staff PPE, and extensive cleaning of exam room while observing appropriate contact time as indicated for disinfecting solutions.  Subjective:     Patient ID: Yvonne [name] , female    DOB: 1970-09-23 , 50 y.o.   MRN: [id number]   Chief Complaint  Patient presents with   Weight Check     HPI  Pt here for weight check. She feels like the last 2-3 weeks does not feel like she has been taking any medications. She has been eating more sugars before Halloween.   She is on Repatha due to poor tolerance to statin. Dr Debara Pickett feels her elevated cholesterol is related to genetics. She is taking rosuvastatin.     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 nephew   Colon cancer Neg Hx    Breast cancer Neg Hx    Colon polyps Neg Hx    Esophageal cancer Neg Hx    Rectal cancer Neg Hx      Current Outpatient  Medications:    Adapalene 0.3 % gel, Apply 1 application topically as needed (zits). , Disp: , Rfl:    Cyanocobalamin (VITAMIN B 12 PO), Take 1 tablet by mouth daily. , Disp: , Rfl:    Evolocumab (REPATHA SURECLICK) 767 MG/ML SOAJ, Inject 1 Dose into the skin every 14 (fourteen) days., Disp: 6 mL, Rfl: 3   MAGNESIUM PO, Take 1 tablet by mouth daily., Disp: , Rfl:    Melatonin 5 MG CHEW, Chew by mouth as needed., Disp: , Rfl:    Multiple Vitamins-Minerals (CENTRUM ADULTS PO), Take 1 tablet by mouth daily., Disp: , Rfl:    Omega-3 Fatty Acids (FISH OIL PO), Take 1 capsule by mouth daily., Disp: , Rfl:    rosuvastatin (CRESTOR) 5 MG tablet, Take 1 tablet (5 mg total) by mouth daily., Disp: 90 tablet, Rfl: 3   Semaglutide, 1 MG/DOSE, (OZEMPIC, 1 MG/DOSE,) 4 MG/3ML SOPN, Inject 1 mg into the skin once a week., Disp: 9 mL, Rfl: 1   ipratropium (ATROVENT) 0.03 % nasal spray, PLACE 1 SPRAY INTO BOTH NOSTRILS DAILY (Patient taking differently: Place 2 sprays into both nostrils daily.), Disp: 30 mL, Rfl: 1   omeprazole (PRILOSEC) 40 MG  capsule, Take 1 capsule (40 mg total) by mouth daily., Disp: 90 capsule, Rfl: 3   PARoxetine (PAXIL) 20 MG tablet, TAKE 1 TABLET BY MOUTH EVERY DAY (Patient taking differently: Take 10 mg by mouth daily.), Disp: 90 tablet, Rfl: 1   phentermine 37.5 MG capsule, Take 1 capsule (37.5 mg total) by mouth every morning., Disp: 30 capsule, Rfl: 1   Probiotic Product (RESTORA) CAPS, Take 1 capsule by mouth daily., Disp: 90 capsule, Rfl: 1   valACYclovir (VALTREX) 1000 MG tablet, TAKE 1 TABLET BY MOUTH EVERY 12 HOURS, Disp: 180 tablet, Rfl: 1   Allergies  Allergen Reactions   Statins Other (See Comments)    myalgias   Sulfa Antibiotics Itching     Review of Systems  Constitutional: Negative.  Negative for fatigue.  Respiratory: Negative.    Cardiovascular: Negative.  Negative for chest pain, palpitations and leg swelling.  Neurological: Negative.  Negative for dizziness and  headaches.  Psychiatric/Behavioral: Negative.      Today's Vitals   07/10/21 1140  BP: 120/84  Pulse: 69  Temp: 98.2 F (36.8 C)  Weight: 161 lb 3.2 oz (73.1 kg)  Height: 5\' 4"  (1.626 m)   Body mass index is 27.67 kg/m.  Wt Readings from Last 3 Encounters:  07/31/21 155 lb 4 oz (70.4 kg)  07/10/21 161 lb 3.2 oz (73.1 kg)  06/28/21 157 lb (71.2 kg)    Objective:  Physical Exam Vitals reviewed.  Constitutional:      General: She is not in acute distress.    Appearance: Normal appearance.  Cardiovascular:     Rate and Rhythm: Normal rate and regular rhythm.     Pulses: Normal pulses.     Heart sounds: Normal heart sounds. No murmur heard. Pulmonary:     Effort: Pulmonary effort is normal. No respiratory distress.     Breath sounds: Normal breath sounds. No wheezing.  Skin:    Capillary Refill: Capillary refill takes less than 2 seconds.  Neurological:     General: No focal deficit present.     Mental Status: She is alert and oriented to person, place, and time.     Cranial Nerves: No cranial nerve deficit.     Motor: No weakness.  Psychiatric:        Mood and Affect: Mood normal.        Thought Content: Thought content normal.        Judgment: Judgment normal.        Assessment And Plan:     1. Abnormal glucose Comments: Continue with Ozempic doing well.  - Semaglutide, 1 MG/DOSE, (OZEMPIC, 1 MG/DOSE,) 4 MG/3ML SOPN; Inject 1 mg into the skin once a week.  Dispense: 9 mL; Refill: 1  2. BMI 25.0-25.9,adult Comments: She continues a gradual decline with her weight - phentermine 37.5 MG capsule; Take 1 capsule (37.5 mg total) by mouth every morning.  Dispense: 30 capsule; Refill: 1    Patient was given opportunity to ask questions. Patient verbalized understanding of the plan and was able to repeat key elements of the plan. All questions were answered to their satisfaction.  Minette Brine, FNP   I, Minette Brine, FNP, have reviewed all documentation for this  visit. The documentation on 07/10/21 for the exam, diagnosis, procedures, and orders are all accurate and complete.   IF YOU HAVE BEEN REFERRED TO A SPECIALIST, IT MAY TAKE 1-2 WEEKS TO SCHEDULE/PROCESS THE REFERRAL. IF YOU HAVE NOT HEARD FROM US/SPECIALIST IN TWO WEEKS, PLEASE  GIVE [name] A CALL AT [phone].   THE PATIENT IS ENCOURAGED TO PRACTICE SOCIAL DISTANCING DUE TO THE COVID-19 PANDEMIC.

## 2021-07-11 ENCOUNTER — Other Ambulatory Visit (HOSPITAL_BASED_OUTPATIENT_CLINIC_OR_DEPARTMENT_OTHER): Payer: Self-pay | Admitting: Nurse Practitioner

## 2021-07-11 DIAGNOSIS — Z1231 Encounter for screening mammogram for malignant neoplasm of breast: Secondary | ICD-10-CM

## 2021-07-17 ENCOUNTER — Ambulatory Visit (HOSPITAL_BASED_OUTPATIENT_CLINIC_OR_DEPARTMENT_OTHER)
Admission: RE | Admit: 2021-07-17 | Discharge: 2021-07-17 | Disposition: A | Discharge status: Home / Self Care | Payer: 59 | Source: Ambulatory Visit | Attending: Nurse Practitioner | Admitting: Nurse Practitioner

## 2021-07-17 ENCOUNTER — Other Ambulatory Visit: Payer: Self-pay

## 2021-07-17 DIAGNOSIS — Z1231 Encounter for screening mammogram for malignant neoplasm of breast: Secondary | ICD-10-CM | POA: Insufficient documentation

## 2021-07-31 ENCOUNTER — Ambulatory Visit (INDEPENDENT_AMBULATORY_CARE_PROVIDER_SITE_OTHER): Payer: 59 | Admitting: Gastroenterology

## 2021-07-31 ENCOUNTER — Encounter: Payer: Self-pay | Admitting: Gastroenterology

## 2021-07-31 VITALS — BP 116/70 | HR 76 | Ht 65.0 in | Wt 155.2 lb

## 2021-07-31 DIAGNOSIS — R142 Eructation: Secondary | ICD-10-CM | POA: Diagnosis not present

## 2021-07-31 MED ORDER — OMEPRAZOLE 40 MG PO CPDR: 40.0000 mg | DELAYED_RELEASE_CAPSULE | Freq: Every day | ORAL | 3 refills | Status: AC

## 2021-07-31 NOTE — Progress Notes (Signed)
Referring Provider: Minette Brine, FNP Primary Care Physician:  Minette Brine, FNP   Reason for Consultation:  Belching   IMPRESSION:  Belching without alarm features  Temporal association with symptom onset and starting Ozympic    Differential: GERD, functional dyspepsia, H pylori, supragastric belching, SIBO, side effect of Ozempic  I've recommended efforts decrease air swallowing. I've recommended the discontinuation of gum chewing, smoking, drinking carbonated beverages, and gulping foods and liquids. Any underlying anxiety or depression should be treated.   Increase PPI to prescription strength is recommended for management of possible reflux. EGD recommended. If EGD is negative and there is no response to empiric treatment, will consider 24 hour pH/impedence testing to evaluate for supragastric belching and air swallowing.   May want to consider alternatives to Ozempic.   Other treatment options to consider in the future include: therapy (cognitive behavioral therapist or speech therapist) with special training in diaphragmatic breathing techniques and/or baclofen 10 mg TID which reduces transient lower esophageal sphincter relaxations and centrally suppressing the swallowing rate, possibly decreasing both supragastric and gastric belching. Will use as a last resort given the potential side effects.   PLAN: - Increase omeprazole 40 mg daily - Lifestyle modifications as outlined in patient instructions - EGD with biopsies - If no clinical improvement will try breath testing for SIBO +/- empiric treatment with Xifaxan    HPI: Yvonne [name] is a 50 y.o. female whom I initially met for screening colonoscopy in 2021. This is our first office visit. She was referred by NP Laurance Flatten for further evaluation of eructation.   She reports a 3-4 month history of persistent eructation despite omeprazole and probiotics. There is no associated abdominal pain, dysphagia, heartburn, or  regurgitation. No malodorous eructation. Symptoms developed after starting Ozempic which has otherwise caused some mild, intermittent nausea.  She stopped coffee and wine. Reduced fatty, fried, and spicy foods. She has stopped using straws and chewing gum.    ` She is not interested an upper endoscopy although will consider it.   She had a screening colonoscopy with me 11/24/2019 revealing adenomatous appearing area in the descending colon.  On pathology this was benign colonic mucosa.  The exam was otherwise normal.  Son has EOE followed at [facility]. Mother with a history of ulcer disease. Maternal grandfather required gastric surgery.    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    Hx: UTI (urinary tract infection) 05/06/11   Hyperlipidemia    Hypertension    PONV (postoperative nausea and vomiting)    Vulvitis 02/04/11   Yeast infection 06/19/2010    Past Surgical History:  Procedure Laterality Date   ABDOMINAL HYSTERECTOMY     CESAREAN SECTION  2008 & 2009   COLONOSCOPY  11/24/2019   CRYOTHERAPY  2009   CYSTOSCOPY  06/15/2012   Procedure: CYSTOSCOPY;  Surgeon: Delice Lesch, MD;  Location: [facility];  Service: Gynecology;  Laterality: N/A;   KNEE ARTHROSCOPY Left 05/16/2020   Procedure: LEFT KNEE ARTHROSCOPY, PARTIAL MENISCECTOMY;  Surgeon: Melrose Nakayama, MD;  Location: [facility];  Service: Orthopedics;  Laterality: Left;   KNEE ARTHROSCOPY WITH MEDIAL MENISECTOMY Left 10/13/2020   Procedure: LEFT KNEE ARTHROSCOPY WITH PARTIAL MEDIAL MENISCECTOMY;  Surgeon: Leandrew Koyanagi, MD;  Location: [facility];  Service: Orthopedics;  Laterality: Left;   LAPAROSCOPIC HYSTERECTOMY  06/15/2012  Procedure: HYSTERECTOMY TOTAL LAPAROSCOPIC;  Surgeon: Delice Lesch, MD;  Location: [facility];  Service: Gynecology;  Laterality: N/A;   MYOMECTOMY  2005   TUBAL LIGATION     bilateral     Current Outpatient Medications  Medication Sig Dispense Refill   Adapalene 0.3 % gel Apply 1 application topically as needed (zits).      Cyanocobalamin (VITAMIN B 12 PO) Take 1 tablet by mouth daily.      Evolocumab (REPATHA SURECLICK) 734 MG/ML SOAJ Inject 1 Dose into the skin every 14 (fourteen) days. 6 mL 3   ipratropium (ATROVENT) 0.03 % nasal spray PLACE 1 SPRAY INTO BOTH NOSTRILS DAILY (Patient taking differently: Place 2 sprays into both nostrils daily.) 30 mL 1   MAGNESIUM PO Take 1 tablet by mouth daily.     Melatonin 5 MG CHEW Chew by mouth as needed.     Multiple Vitamins-Minerals (CENTRUM ADULTS PO) Take 1 tablet by mouth daily.     Omega-3 Fatty Acids (FISH OIL PO) Take 1 capsule by mouth daily.     omeprazole (PRILOSEC) 40 MG capsule Take 1 capsule (40 mg total) by mouth daily. 90 capsule 3   PARoxetine (PAXIL) 20 MG tablet TAKE 1 TABLET BY MOUTH EVERY DAY (Patient taking differently: Take 10 mg by mouth daily.) 90 tablet 1   phentermine 37.5 MG capsule Take 1 capsule (37.5 mg total) by mouth every morning. 30 capsule 1   Probiotic Product (RESTORA) CAPS Take 1 capsule by mouth daily. 90 capsule 1   rosuvastatin (CRESTOR) 5 MG tablet Take 1 tablet (5 mg total) by mouth daily. 90 tablet 3   Semaglutide, 1 MG/DOSE, (OZEMPIC, 1 MG/DOSE,) 4 MG/3ML SOPN Inject 1 mg into the skin once a week. 9 mL 1   valACYclovir (VALTREX) 1000 MG tablet TAKE 1 TABLET BY MOUTH EVERY 12 HOURS 180 tablet 1   No current facility-administered medications for this visit.    Allergies as of 07/31/2021 - Review Complete 07/31/2021  Allergen Reaction Noted   Statins Other (See Comments) 02/06/2021   Sulfa antibiotics Itching 06/19/2010    Family History  Problem Relation Age of Onset   Hypertension Mother    Heart disease Mother    Diabetes Father    Alcohol abuse Father    Diabetes Brother    Hypertension Brother    Alcohol abuse Brother    Heart attack Maternal Grandfather    Stomach  cancer Maternal Grandfather    Hypertension Brother    Crohn's disease Other        mat 2nd cousin   Hyperlipidemia Other    Irritable bowel syndrome Other        mat. nephew   Colon cancer Neg Hx    Breast cancer Neg Hx    Colon polyps Neg Hx    Esophageal cancer Neg Hx    Rectal cancer Neg Hx     Physical Exam: General:   Alert,  well-nourished, pleasant and cooperative in NAD Head:  Normocephalic and atraumatic. Eyes:  Sclera clear, no icterus.   Conjunctiva pink. Ears:  Normal auditory acuity. Nose:  No deformity, discharge,  or lesions. Mouth:  No deformity or lesions.   Neck:  Supple; no masses or thyromegaly. Lungs:  Clear throughout to auscultation.   No wheezes. Heart:  Regular rate and rhythm; no murmurs. Abdomen:  Soft,nontender, nondistended, normal bowel sounds, no rebound or guarding. No hepatosplenomegaly.   Rectal:  Deferred  Msk:  Symmetrical. No boney deformities  LAD: No inguinal or umbilical LAD Extremities:  No clubbing or edema. Neurologic:  Alert and  oriented x4;  grossly nonfocal Skin:  Intact without significant lesions or rashes. Psych:  Alert and cooperative. Normal mood and affect.     Anik Wesch L. Tarri Glenn, MD, MPH 07/31/2021, 11:58 AM

## 2021-07-31 NOTE — Patient Instructions (Addendum)
It was my pleasure to provide care to you today. Based on our discussion, I am providing you with my recommendations below:  RECOMMENDATION(S):   I recommend that you continue to use your omeprazole 40 mg daily to control your symptoms.  PRESCRIPTION MEDICATION(S):   We have sent the following medication(s) to your pharmacy:  Omeprazole  NOTE: If your medication(s) requires a PRIOR AUTHORIZATION, we will receive notification from your pharmacy. Once received, the process to submit for approval may take up to 7-10 business days. You will be contacted about any denials we have received from your insurance company as well as alternatives recommended by your provider.  I have recommended an upper endoscopy to evaluate for infection and inflammation.   ENDOSCOPY:   You have been scheduled for an endoscopy. Please follow written instructions given to you at your visit today.  INHALERS:   If you use inhalers (even only as needed), please bring them with you on the day of your procedure.  MEDICATIONS TO HOLD:  We will contact your provider to request permission for you to hold [place]. Once we receive a response, you will be contacted by our office. If you do not hear from our office 1 week prior to your scheduled procedure, please contact our office at [phone].    Constant burping can be annoying -- and embarrassing! But there are some simple steps that can help you squelch belching. The key is to reduce the amount of air you swallow.  Start by looking at some simple habits.   Two of the biggest culprits behind swallowing too much air are chewing gum and smoking. Drop these habits and you'll be gulping less air -- and quitting smoking has even more important health benefits!  And avoid "high-air" foods and beverages like carbonated beverages and whipped desserts.   After eating, consider taking a stroll rather than plunking down in front of the TV. Staying upright and moving helps  your stomach empty and relieves bloated feelings. When it's time to go to bed, try sleeping on your stomach or right side to aid in the escape of gas and alleviate fullness.  Some people swear by eating brown rice or barley broth regularly. Papaya and pineapple are also said to help.   Whatever you eat, chew foods slowly, avoid washing meals down with liquids, and try to eat smaller servings.   Swallowing a lot of air (aerophagia) can be related to stress or anxiety, and treating these underlying issues may help calm your digestive tract.  FOLLOW UP:  After your procedure, you will receive a call from my office staff regarding my recommendation for follow up.  MY CHART:  The [facility] providers would like to encourage you to use [facility] to communicate with providers for non-urgent requests or questions.  Due to long hold times on the telephone, sending your provider a message by [facility] may be a faster and more efficient way to get a response.  Please allow 48 business hours for a response.  Please remember that this is for non-urgent requests.   Thank you for trusting me with your gastrointestinal care!    Thornton Park, MD, MPH

## 2021-08-01 ENCOUNTER — Encounter: Payer: Self-pay | Admitting: Nurse Practitioner

## 2021-08-01 ENCOUNTER — Other Ambulatory Visit: Payer: Self-pay | Admitting: Nurse Practitioner

## 2021-08-01 DIAGNOSIS — R7309 Other abnormal glucose: Secondary | ICD-10-CM

## 2021-08-22 ENCOUNTER — Encounter: Payer: 59 | Admitting: Gastroenterology

## 2021-08-27 ENCOUNTER — Encounter: Payer: Self-pay | Admitting: Nurse Practitioner

## 2021-08-28 ENCOUNTER — Ambulatory Visit: Payer: BC Managed Care – PPO | Admitting: Nurse Practitioner

## 2021-08-28 ENCOUNTER — Encounter: Payer: Self-pay | Admitting: Nurse Practitioner

## 2021-08-28 ENCOUNTER — Other Ambulatory Visit: Payer: Self-pay

## 2021-08-28 VITALS — BP 118/62 | HR 95 | Temp 98.7°F | Ht 64.4 in | Wt 155.8 lb

## 2021-08-28 DIAGNOSIS — L0291 Cutaneous abscess, unspecified: Secondary | ICD-10-CM | POA: Diagnosis not present

## 2021-08-28 DIAGNOSIS — R7303 Prediabetes: Secondary | ICD-10-CM | POA: Diagnosis not present

## 2021-08-28 MED ORDER — DOXYCYCLINE MONOHYDRATE 100 MG PO CAPS: 100.0000 mg | ORAL_CAPSULE | Freq: Two times a day (BID) | ORAL | 0 refills | Status: DC

## 2021-08-28 NOTE — Progress Notes (Signed)
I,Tianna Badgett,acting as a Education administrator for [employer], NP.,have documented all relevant documentation on the behalf of [employer], NP,as directed by  Bary Castilla, NP while in the presence of Bary Castilla, NP.  This visit occurred during the SARS-CoV-2 public health emergency.  Safety protocols were in place, including screening questions prior to the visit, additional usage of staff PPE, and extensive cleaning of exam room while observing appropriate contact time as indicated for disinfecting solutions.  Subjective:     Patient ID: Yvonne [name] , female    DOB: 24-Apr-1971 , 51 y.o.   MRN: [id number]   Chief Complaint  Patient presents with   Mass   Abscess    HPI  Patient is here for abscess on her back. She does not recall of a bite. She does do a lot of yard work. Denies fever, chills.     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 nephew   Colon cancer Neg Hx    Breast cancer Neg Hx    Colon polyps Neg Hx    Esophageal cancer Neg Hx    Rectal cancer Neg Hx      Current Outpatient Medications:    doxycycline (MONODOX) 100 MG capsule, Take 1 capsule (100 mg total) by mouth 2 (two) times daily., Disp: 14 capsule, Rfl: 0   Adapalene 0.3 % gel, Apply 1  application topically as needed (zits). , Disp: , Rfl:    Cyanocobalamin (VITAMIN B 12 PO), Take 1 tablet by mouth daily. , Disp: , Rfl:    Evolocumab (REPATHA SURECLICK) 371 MG/ML SOAJ, Inject 1 Dose into the skin every 14 (fourteen) days., Disp: 6 mL, Rfl: 3   ipratropium (ATROVENT) 0.03 % nasal spray, PLACE 1 SPRAY INTO BOTH NOSTRILS DAILY (Patient taking differently: Place 2 sprays into both nostrils daily.), Disp: 30 mL, Rfl: 1   MAGNESIUM PO, Take 1 tablet by mouth daily., Disp: , Rfl:    Melatonin 5 MG CHEW, Chew by mouth as needed., Disp: , Rfl:    Multiple Vitamins-Minerals (CENTRUM ADULTS PO), Take 1 tablet by mouth daily., Disp: , Rfl:    Omega-3 Fatty Acids (FISH OIL PO), Take 1 capsule by mouth daily., Disp: , Rfl:    omeprazole (PRILOSEC) 40 MG capsule, Take 1 capsule (40 mg total) by mouth daily., Disp: 90 capsule, Rfl: 3   PARoxetine (PAXIL) 20 MG tablet, TAKE 1 TABLET BY MOUTH EVERY DAY (Patient taking differently: Take 10 mg by mouth daily.), Disp: 90 tablet, Rfl: 1   phentermine 37.5 MG capsule, Take 1 capsule (  37.5 mg total) by mouth every morning., Disp: 30 capsule, Rfl: 1   Probiotic Product (RESTORA) CAPS, Take 1 capsule by mouth daily., Disp: 90 capsule, Rfl: 1   rosuvastatin (CRESTOR) 5 MG tablet, Take 1 tablet (5 mg total) by mouth daily., Disp: 90 tablet, Rfl: 3   Semaglutide, 1 MG/DOSE, (OZEMPIC, 1 MG/DOSE,) 4 MG/3ML SOPN, Inject 1 mg into the skin once a week., Disp: 9 mL, Rfl: 1   valACYclovir (VALTREX) 1000 MG tablet, TAKE 1 TABLET BY MOUTH EVERY 12 HOURS, Disp: 180 tablet, Rfl: 1   Allergies  Allergen Reactions   Statins Other (See Comments)    myalgias   Sulfa Antibiotics Itching     Review of Systems  Constitutional: Negative.  Negative for chills, fatigue and fever.  Respiratory: Negative.  Negative for cough, shortness of breath and wheezing.   Cardiovascular: Negative.  Negative for chest pain and palpitations.  Gastrointestinal: Negative.   Skin:   Positive for wound.       Abscess/boil to right mid back.   Neurological: Negative.  Negative for dizziness and headaches.    Today's Vitals   08/28/21 0944  BP: 118/62  Pulse: 95  Temp: 98.7 F (37.1 C)  TempSrc: Oral  Weight: 155 lb 12.8 oz (70.7 kg)  Height: 5' 4.4" (1.636 m)   Body mass index is 26.41 kg/m.  Wt Readings from Last 3 Encounters:  08/28/21 155 lb 12.8 oz (70.7 kg)  07/31/21 155 lb 4 oz (70.4 kg)  07/10/21 161 lb 3.2 oz (73.1 kg)    Objective:  Physical Exam Constitutional:      Appearance: Normal appearance.  HENT:     Head: Normocephalic and atraumatic.  Cardiovascular:     Rate and Rhythm: Normal rate and regular rhythm.     Pulses: Normal pulses.     Heart sounds: Normal heart sounds. No murmur heard. Pulmonary:     Effort: No respiratory distress.     Breath sounds: Normal breath sounds. No wheezing.  Skin:    General: Skin is warm and dry.     Capillary Refill: Capillary refill takes less than 2 seconds.     Findings: Erythema and wound present.          Comments: Painful, tender, firm, pus filled abscess/boil. No drainage noted.   Neurological:     Mental Status: She is alert.        Assessment And Plan:     1. Abscess - doxycycline (MONODOX) 100 MG capsule; Take 1 capsule (100 mg total) by mouth 2 (two) times daily.  Dispense: 14 capsule; Refill: 0 - CBC with Diff - CMP14+EGFR -Will treat it with doxycyline; will check WBC  -Advised patient if area gets bigger, she has a fever to call us or go to the nearest urgent care.   2. Prediabetes -Chronic, stable, continue ozempic.  -Will checks labs today.  --Discussed with patient the importance of glycemic control and long term complications from uncontrolled diabetes. Discussed with the patient the importance of compliance with home glucose monitoring, diet which includes decrease amount of sugary drinks and foods. Importance of exercise was also discussed with the patient. Importance of  eye exams, self foot care and compliance to office visits was also discussed with the patient.  - Hemoglobin A1c   The patient was encouraged to call or send a message through [place] for any questions or concerns.   Follow up: if symptoms persist or do not get better.   Side  effects and appropriate use of all the medication(s) were discussed with the patient today. Patient advised to use the medication(s) as directed by their healthcare provider. The patient was encouraged to read, review, and understand all associated package inserts and contact our office with any questions or concerns. The patient accepts the risks of the treatment plan and had an opportunity to ask questions.   Patient was given opportunity to ask questions. Patient verbalized understanding of the plan and was able to repeat key elements of the plan. All questions were answered to their satisfaction.  Yvonne Samaya Boardley, DNP   I, Yvonne [name] have reviewed all documentation for this visit. The documentation on 08/28/20 for the exam, diagnosis, procedures, and orders are all accurate and complete.    IF YOU HAVE BEEN REFERRED TO A SPECIALIST, IT MAY TAKE 1-2 WEEKS TO SCHEDULE/PROCESS THE REFERRAL. IF YOU HAVE NOT HEARD FROM US/SPECIALIST IN TWO WEEKS, PLEASE GIVE [name] A CALL AT [phone].   THE PATIENT IS ENCOURAGED TO PRACTICE SOCIAL DISTANCING DUE TO THE COVID-19 PANDEMIC.

## 2021-08-28 NOTE — Patient Instructions (Signed)
Skin Abscess A skin abscess is an infected area of your skin that contains pus and other material. An abscess can happen in any part of your body. Some abscesses break open (rupture) on their own. Most continue to get worse unless they are treated. The infection can spread deeper into the body and into your blood, which can make you feel sick. A skin abscess is caused by germs that enter the skin through a cut or scrape. It can also be caused by blocked oil and sweat glands or infected hair follicles. This condition is usually treated by: Draining the pus. Taking antibiotic medicines. Placing a warm, wet washcloth over the abscess. Follow these instructions at home: Medicines  Take over-the-counter and prescription medicines only as told by your doctor. If you were prescribed an antibiotic medicine, take it as told by your doctor. Do not stop taking the antibiotic even if you start to feel better. Abscess care  If you have an abscess that has not drained, place a warm, clean, wet washcloth over the abscess several times a day. Do this as told by your doctor. Follow instructions from your doctor about how to take care of your abscess. Make sure you: Cover the abscess with a bandage (dressing). Change your bandage or gauze as told by your doctor. Wash your hands with soap and water before you change the bandage or gauze. If you cannot use soap and water, use hand sanitizer. Check your abscess every day for signs that the infection is getting worse. Check for: More redness, swelling, or pain. More fluid or blood. Warmth. More pus or a bad smell. General instructions To avoid spreading the infection: Do not share personal care items, towels, or hot tubs with others. Avoid making skin-to-skin contact with other people. Keep all follow-up visits as told by your doctor. This is important. Contact a doctor if: You have more redness, swelling, or pain around your abscess. You have more fluid or  blood coming from your abscess. Your abscess feels warm when you touch it. You have more pus or a bad smell coming from your abscess. You have a fever. Your muscles ache. You have chills. You feel sick. Get help right away if: You have very bad (severe) pain. You see red streaks on your skin spreading away from the abscess. Summary A skin abscess is an infected area of your skin that contains pus and other material. The abscess is caused by germs that enter the skin through a cut or scrape. It can also be caused by blocked oil and sweat glands or infected hair follicles. Follow your doctor's instructions on caring for your abscess, taking medicines, preventing infections, and keeping follow-up visits. This information is not intended to replace advice given to you by your health care provider. Make sure you discuss any questions you have with your health care provider. Document Revised: 03/18/2019 Document Reviewed: 09/25/2017 Elsevier Patient Education  2022 [employer].

## 2021-08-29 ENCOUNTER — Telehealth: Payer: Self-pay | Admitting: Gastroenterology

## 2021-08-29 DIAGNOSIS — L720 Epidermal cyst: Secondary | ICD-10-CM | POA: Diagnosis not present

## 2021-08-29 LAB — CMP14+EGFR
ALT: 17 IU/L (ref 0–32)
AST: 20 IU/L (ref 0–40)
Albumin/Globulin Ratio: 2.2 (ref 1.2–2.2)
Albumin: 4.7 g/dL (ref 3.8–4.9)
Alkaline Phosphatase: 84 IU/L (ref 44–121)
BUN/Creatinine Ratio: 13 (ref 9–23)
BUN: 10 mg/dL (ref 6–24)
Bilirubin Total: 0.4 mg/dL (ref 0.0–1.2)
CO2: 26 mmol/L (ref 20–29)
Calcium: 9.7 mg/dL (ref 8.7–10.2)
Chloride: 103 mmol/L (ref 96–106)
Creatinine, Ser: 0.77 mg/dL (ref 0.57–1.00)
Globulin, Total: 2.1 g/dL (ref 1.5–4.5)
Glucose: 92 mg/dL (ref 70–99)
Potassium: 5 mmol/L (ref 3.5–5.2)
Sodium: 142 mmol/L (ref 134–144)
Total Protein: 6.8 g/dL (ref 6.0–8.5)
eGFR: 93 mL/min/{1.73_m2} (ref >59)

## 2021-08-29 LAB — CBC WITH DIFFERENTIAL/PLATELET
Basophils Absolute: 0 10*3/uL (ref 0.0–0.2)
Basos: 1 %
EOS (ABSOLUTE): 0.1 10*3/uL (ref 0.0–0.4)
Eos: 2 %
Hematocrit: 44 % (ref 34.0–46.6)
Hemoglobin: 14.9 g/dL (ref 11.1–15.9)
Immature Grans (Abs): 0 10*3/uL (ref 0.0–0.1)
Immature Granulocytes: 0 %
Lymphocytes Absolute: 1.1 10*3/uL (ref 0.7–3.1)
Lymphs: 28 %
MCH: 30.3 pg (ref 26.6–33.0)
MCHC: 33.9 g/dL (ref 31.5–35.7)
MCV: 90 fL (ref 79–97)
Monocytes Absolute: 0.4 10*3/uL (ref 0.1–0.9)
Monocytes: 9 %
Neutrophils Absolute: 2.4 10*3/uL (ref 1.4–7.0)
Neutrophils: 60 %
Platelets: 289 10*3/uL (ref 150–450)
RBC: 4.91 x10E6/uL (ref 3.77–5.28)
RDW: 12.6 % (ref 11.7–15.4)
WBC: 4 10*3/uL (ref 3.4–10.8)

## 2021-08-29 LAB — HEMOGLOBIN A1C
Est. average glucose Bld gHb Est-mCnc: 111 mg/dL
Hgb A1c MFr Bld: 5.5 % (ref 4.8–5.6)

## 2021-08-29 NOTE — Telephone Encounter (Signed)
Noted. Thanks.

## 2021-08-29 NOTE — Telephone Encounter (Signed)
Good Morning Dr. Tarri Glenn,   Patient called to cancel procedure for tomorrow at 2:00 due to her ride having Covid.  Patient stated that she will call back at a later time to reschedule.

## 2021-08-30 ENCOUNTER — Encounter: Payer: 59 | Admitting: Gastroenterology

## 2021-08-31 DIAGNOSIS — L720 Epidermal cyst: Secondary | ICD-10-CM | POA: Diagnosis not present

## 2021-09-02 ENCOUNTER — Encounter: Payer: Self-pay | Admitting: Gastroenterology

## 2021-09-06 ENCOUNTER — Encounter: Payer: Self-pay | Admitting: Nurse Practitioner

## 2021-09-06 ENCOUNTER — Encounter: Payer: Self-pay | Admitting: Gastroenterology

## 2021-09-08 ENCOUNTER — Other Ambulatory Visit: Payer: Self-pay | Admitting: Nurse Practitioner

## 2021-09-08 DIAGNOSIS — Z6825 Body mass index (BMI) 25.0-25.9, adult: Secondary | ICD-10-CM

## 2021-09-11 ENCOUNTER — Ambulatory Visit: Payer: 59 | Admitting: Nurse Practitioner

## 2021-09-11 NOTE — Telephone Encounter (Signed)
Needs appt before next fill, no appt on file

## 2021-09-14 ENCOUNTER — Encounter: Payer: Self-pay | Admitting: Gastroenterology

## 2021-09-14 ENCOUNTER — Ambulatory Visit: Payer: BC Managed Care – PPO | Admitting: Gastroenterology

## 2021-09-14 ENCOUNTER — Other Ambulatory Visit: Payer: Self-pay | Admitting: Nurse Practitioner

## 2021-09-14 VITALS — BP 134/92 | HR 73 | Temp 97.7°F | Resp 10 | Ht 65.0 in | Wt 155.0 lb

## 2021-09-14 DIAGNOSIS — Z6825 Body mass index (BMI) 25.0-25.9, adult: Secondary | ICD-10-CM

## 2021-09-14 DIAGNOSIS — R142 Eructation: Secondary | ICD-10-CM | POA: Diagnosis not present

## 2021-09-14 DIAGNOSIS — K219 Gastro-esophageal reflux disease without esophagitis: Secondary | ICD-10-CM | POA: Diagnosis not present

## 2021-09-14 MED ORDER — SODIUM CHLORIDE 0.9 % IV SOLN: 500.0000 mL | Freq: Once | INTRAVENOUS | Status: DC

## 2021-09-14 MED ORDER — PHENTERMINE HCL 37.5 MG PO CAPS: 37.5000 mg | ORAL_CAPSULE | Freq: Every morning | ORAL | 0 refills | Status: DC

## 2021-09-14 NOTE — Progress Notes (Signed)
Called to room to assist during endoscopic procedure.  Patient ID and intended procedure confirmed with present staff. Received instructions for my participation in the procedure from the performing physician.  

## 2021-09-14 NOTE — Progress Notes (Signed)
Report to PACU, RN, vss, BBS= Clear.  

## 2021-09-14 NOTE — Patient Instructions (Signed)
Resume previous diet.  Continue present medication to include Omeprazole 40 mg every morning.  Await pathology results.  YOU HAD AN ENDOSCOPIC PROCEDURE TODAY AT [facility]:   Refer to the procedure report that was given to you for any specific questions about what was found during the examination.  If the procedure report does not answer your questions, please call your gastroenterologist to clarify.  If you requested that your care partner not be given the details of your procedure findings, then the procedure report has been included in a sealed envelope for you to review at your convenience later.  YOU SHOULD EXPECT: Some feelings of bloating in the abdomen. Passage of more gas than usual.  Walking can help get rid of the air that was put into your GI tract during the procedure and reduce the bloating. If you had a lower endoscopy (such as a colonoscopy or flexible sigmoidoscopy) you may notice spotting of blood in your stool or on the toilet paper. If you underwent a bowel prep for your procedure, you may not have a normal bowel movement for a few days.  Please Note:  You might notice some irritation and congestion in your nose or some drainage.  This is from the oxygen used during your procedure.  There is no need for concern and it should clear up in a day or so.  SYMPTOMS TO REPORT IMMEDIATELY:   Following upper endoscopy (EGD)  Vomiting of blood or coffee ground material  New chest pain or pain under the shoulder blades  Painful or persistently difficult swallowing  New shortness of breath  Fever of 100F or higher  Black, tarry-looking stools  For urgent or emergent issues, a gastroenterologist can be reached at any hour by calling [phone]. Do not use MyChart messaging for urgent concerns.    DIET:  We do recommend a small meal at first, but then you may proceed to your regular diet.  Drink plenty of fluids but you should avoid alcoholic beverages for 24  hours.  ACTIVITY:  You should plan to take it easy for the rest of today and you should NOT DRIVE or use heavy machinery until tomorrow (because of the sedation medicines used during the test).    FOLLOW UP: Our staff will call the number listed on your records 48-72 hours following your procedure to check on you and address any questions or concerns that you may have regarding the information given to you following your procedure. If we do not reach you, we will leave a message.  We will attempt to reach you two times.  During this call, we will ask if you have developed any symptoms of COVID 19. If you develop any symptoms (ie: fever, flu-like symptoms, shortness of breath, cough etc.) before then, please call [phone].  If you test positive for Covid 19 in the 2 weeks post procedure, please call and report this information to [name].    If any biopsies were taken you will be contacted by phone or by letter within the next 1-3 weeks.  Please call us at [phone] if you have not heard about the biopsies in 3 weeks.    SIGNATURES/CONFIDENTIALITY: You and/or your care partner have signed paperwork which will be entered into your electronic medical record.  These signatures attest to the fact that that the information above on your After Visit Summary has been reviewed and is understood.  Full responsibility of the confidentiality of this discharge information lies  with you and/or your care-partner.

## 2021-09-14 NOTE — Progress Notes (Signed)
VS by DT    

## 2021-09-14 NOTE — Progress Notes (Signed)
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 female presents for endoscopic evaluation of eructation. See my office note from 07/31/21 for complete details. There has been no significant change in history of physical exam.   She reports a 3-4 month history of persistent eructation despite omeprazole and probiotics. There is no associated abdominal pain, dysphagia, heartburn, or regurgitation. No malodorous eructation. Symptoms developed after starting Ozempic which has otherwise caused some mild, intermittent nausea.  She stopped coffee and wine. Reduced fatty, fried, and spicy foods. She has stopped using straws and chewing gum.    Past Medical History:  Diagnosis Date   Allergy    Depression    Diabetes mellitus without complication ([place])    Fibroid 2005   H/O menorrhagia 01/16/2011   Herpes 2009   HPV in female    Hx: UTI (urinary tract infection) 05/06/2011   Hyperlipidemia    Hypertension    PONV (postoperative nausea and vomiting)    Vulvitis 02/04/2011   Yeast infection 06/19/2010    Past Surgical History:  Procedure Laterality Date   ABDOMINAL HYSTERECTOMY     CESAREAN SECTION  2008 & 2009   COLONOSCOPY  11/24/2019   CRYOTHERAPY  2009   CYSTOSCOPY  06/15/2012   Procedure: CYSTOSCOPY;  Surgeon: Delice Lesch, MD;  Location: [facility];  Service: Gynecology;  Laterality: N/A;   KNEE ARTHROSCOPY Left 05/16/2020   Procedure: LEFT KNEE ARTHROSCOPY, PARTIAL MENISCECTOMY;  Surgeon: Melrose Nakayama, MD;  Location: [facility];  Service: Orthopedics;  Laterality: Left;   KNEE ARTHROSCOPY WITH MEDIAL MENISECTOMY Left 10/13/2020   Procedure: LEFT KNEE ARTHROSCOPY WITH PARTIAL MEDIAL MENISCECTOMY;  Surgeon: Leandrew Koyanagi, MD;   Location: [facility];  Service: Orthopedics;  Laterality: Left;   LAPAROSCOPIC HYSTERECTOMY  06/15/2012   Procedure: HYSTERECTOMY TOTAL LAPAROSCOPIC;  Surgeon: Delice Lesch, MD;  Location: [facility];  Service: Gynecology;  Laterality: N/A;   MYOMECTOMY  2005   TUBAL LIGATION     bilateral    Current Outpatient Medications  Medication Sig Dispense Refill   Cyanocobalamin (VITAMIN B 12 PO) Take 1 tablet by mouth daily.      ipratropium (ATROVENT) 0.03 % nasal spray PLACE 1 SPRAY INTO BOTH NOSTRILS DAILY (Patient taking differently: Place 2 sprays into both nostrils daily.) 30 mL 1   MAGNESIUM PO Take 1 tablet by mouth daily.     Multiple Vitamins-Minerals (CENTRUM ADULTS PO) Take 1 tablet by mouth daily.     Omega-3 Fatty Acids (FISH OIL PO) Take 1 capsule by mouth daily.     omeprazole (PRILOSEC) 40 MG capsule Take 1 capsule (40 mg total) by mouth daily. 90 capsule 3   PARoxetine (PAXIL) 20 MG tablet TAKE 1 TABLET BY MOUTH EVERY DAY (Patient taking differently: Take 10 mg by mouth daily.) 90 tablet 1   rosuvastatin (CRESTOR) 5 MG tablet Take 1 tablet (5 mg total) by mouth daily. 90 tablet 3   Adapalene 0.3 % gel Apply 1 application topically as needed (zits).      Evolocumab (REPATHA SURECLICK) 790 MG/ML SOAJ Inject 1 Dose into the skin every 14 (fourteen) days. 6 mL 3   Melatonin 5 MG CHEW Chew by mouth as needed.     phentermine 37.5 MG  capsule Take 1 capsule (37.5 mg total) by mouth every morning. 30 capsule 1   Semaglutide, 1 MG/DOSE, (OZEMPIC, 1 MG/DOSE,) 4 MG/3ML SOPN Inject 1 mg into the skin once a week. 9 mL 1   valACYclovir (VALTREX) 1000 MG tablet TAKE 1 TABLET BY MOUTH EVERY 12 HOURS 180 tablet 1   Current Facility-Administered Medications  Medication Dose Route Frequency Provider Last Rate Last Admin   0.9 %  sodium chloride infusion  500 mL Intravenous Once Thornton Park, MD        Allergies as of 09/14/2021 - Review Complete 09/14/2021  Allergen  Reaction Noted   Statins Other (See Comments) 02/06/2021   Sulfa antibiotics Itching 06/19/2010    Family History  Problem Relation Age of Onset   Hypertension Mother    Heart disease Mother    Diabetes Father    Alcohol abuse Father    Diabetes Brother    Hypertension Brother    Alcohol abuse Brother    Heart attack Maternal Grandfather    Stomach cancer Maternal Grandfather    Hypertension Brother    Crohn's disease Other        mat 2nd cousin   Hyperlipidemia Other    Irritable bowel syndrome Other        mat. nephew   Colon cancer Neg Hx    Breast cancer Neg Hx    Colon polyps Neg Hx    Esophageal cancer Neg Hx    Rectal cancer Neg Hx      Physical Exam: General:   Alert,  well-nourished, pleasant and cooperative in NAD Head:  Normocephalic and atraumatic. Eyes:  Sclera clear, no icterus.   Conjunctiva pink. Mouth:  No deformity or lesions.   Neck:  Supple; no masses or thyromegaly. Lungs:  Clear throughout to auscultation.   No wheezes. Heart:  Regular rate and rhythm; no murmurs. Abdomen:  Soft, non-tender, nondistended, normal bowel sounds, no rebound or guarding.  Msk:  Symmetrical. No boney deformities LAD: No inguinal or umbilical LAD Extremities:  No clubbing or edema. Neurologic:  Alert and  oriented x4;  grossly nonfocal Skin:  No obvious rash or bruise. Psych:  Alert and cooperative. Normal mood and affect.     Studies/Results: No results found.    Huma Imhoff L. Tarri Glenn, MD, MPH 09/14/2021, 11:15 AM

## 2021-09-14 NOTE — Op Note (Signed)
[facility] Patient Name: Yvonne [name] Procedure Date: 09/14/2021 11:19 AM MRN: [id number] Endoscopist: Thornton Park MD, MD Age: 51 Referring MD:  Date of Birth: 1970/09/25 Gender: Female Account #: [id number] Procedure:                Upper GI endoscopy Indications:              Eructation Medicines:                Monitored Anesthesia Care Procedure:                Pre-Anesthesia Assessment:                           - Prior to the procedure, a History and Physical                            was performed, and patient medications and                            allergies were reviewed. The patient's tolerance of                            previous anesthesia was also reviewed. The risks                            and benefits of the procedure and the sedation                            options and risks were discussed with the patient.                            All questions were answered, and informed consent                            was obtained. Prior Anticoagulants: The patient has                            taken no previous anticoagulant or antiplatelet                            agents. ASA Grade Assessment: II - A patient with                            mild systemic disease. After reviewing the risks                            and benefits, the patient was deemed in                            satisfactory condition to undergo the procedure.                           After obtaining informed consent, the endoscope was  passed under direct vision. Throughout the                            procedure, the patient's blood pressure, pulse, and                            oxygen saturations were monitored continuously. The                            Olympus Endoscope [id number] was introduced through                            the mouth, and advanced to the third part of                            duodenum. The upper GI endoscopy was  accomplished                            without difficulty. The patient tolerated the                            procedure well. Scope In: Scope Out: Findings:                 The examined esophagus was normal. Biopsies were                            taken from the distal esophagus with a cold forceps                            for histology. Estimated blood loss was minimal.                           The entire examined stomach was normal. Biopsies                            were taken from the antrum, body, and fundus with a                            cold forceps for histology. Estimated blood loss                            was minimal.                           The examined duodenum was normal. Biopsies were                            taken with a cold forceps for histology. Estimated                            blood loss was minimal.                           A medium-sized hiatal hernia was  present.                           The cardia and gastric fundus were normal on                            retroflexion. Complications:            No immediate complications. Estimated blood loss:                            Minimal. Estimated Blood Loss:     Estimated blood loss was minimal. Estimated blood                            loss was minimal. Impression:               - Normal esophagus. Biopsied.                           - Normal stomach. Biopsied.                           - Normal examined duodenum. Biopsied.                           - The examination was otherwise normal. Recommendation:           - Patient has a contact number available for                            emergencies. The signs and symptoms of potential                            delayed complications were discussed with the                            patient. Return to normal activities tomorrow.                            Written discharge instructions were provided to the                            patient.                            - Resume previous diet.                           - Continue present medications including omeprazole                            40 mg every morning.                           - Await pathology results. Thornton Park MD, MD 09/14/2021 11:33:04 AM This report has been signed electronically.

## 2021-09-17 ENCOUNTER — Encounter: Payer: Self-pay | Admitting: Gastroenterology

## 2021-09-18 ENCOUNTER — Telehealth: Payer: Self-pay | Admitting: Internal Medicine

## 2021-09-18 ENCOUNTER — Telehealth: Payer: Self-pay

## 2021-09-18 NOTE — Telephone Encounter (Signed)
°  Follow up Call-  Call back number 09/14/2021 11/24/2019  Post procedure Call Back phone  # [phone]  Permission to leave phone message Yes Yes  Some recent data might be hidden     Patient questions:  Do you have a fever, pain , or abdominal swelling? No. Pain Score  0 *  Have you tolerated food without any problems? Yes.    Have you been able to return to your normal activities? Yes.    Do you have any questions about your discharge instructions: Diet   No. Medications  No. Follow up visit  No.  Do you have questions or concerns about your Care? No.  Actions: * If pain score is 4 or above: No action needed, pain <4.  Have you developed a fever since your procedure? no  2.   Have you had an respiratory symptoms (SOB or cough) since your procedure? no  3.   Have you tested positive for COVID 19 since your procedure no  4.   Have you had any family members/close contacts diagnosed with the COVID 19 since your procedure?  no   If yes to any of these questions please route to Joylene John, RN and Joella Prince, RN

## 2021-09-18 NOTE — Telephone Encounter (Signed)
PA for repatha submitted via CMM (Key: [id number])

## 2021-09-21 ENCOUNTER — Encounter: Payer: Self-pay | Admitting: Gastroenterology

## 2021-09-24 NOTE — Telephone Encounter (Signed)
According to the denial - she will have to try atorvastatin and fail before considering her eligible - doesn't sound like insurance will accept anything else. Would do 40 mg Atorva QHS.  Dr Lemmie Evens

## 2021-09-24 NOTE — Telephone Encounter (Signed)
Patient updated via MyChart about denial/recommendations per Dr. Debara Pickett

## 2021-09-24 NOTE — Telephone Encounter (Signed)
Repatha is denied with new insurance [employer]  The medication is approved when the member is not able to tolerate statin therapy. The member must meet one of the following: Had statin-related rhabdomyolysis or myalgia which occurred with separate trials of BOTH atorvastatin and rosuvastatin AND the muscle symptoms went away when each trial of atorvastatin & rosuvastatin were stopped The member had elevated liver enzymes which occurred during separate trial of BOTH atorvastatin and rosuvastatin   Patient has ONLY tried/failed rosuvastatin - not atorvastatin  Per chart review, there is a historical med niacin-simvastatin (Simcor) 500-20mg  24hr tablet from 2016  Appeal can be submitted: [employer] of [employer]. Level 1 Fax: [phone]  Patient would need to complete the following form to be sent with appeal: [email or web address]

## 2021-09-25 ENCOUNTER — Telehealth: Payer: BC Managed Care – PPO | Admitting: Physician Assistant

## 2021-09-26 ENCOUNTER — Telehealth: Payer: Self-pay | Admitting: Physician Assistant

## 2021-09-26 ENCOUNTER — Encounter: Payer: Self-pay | Admitting: Physician Assistant

## 2021-09-26 ENCOUNTER — Telehealth (INDEPENDENT_AMBULATORY_CARE_PROVIDER_SITE_OTHER): Payer: BC Managed Care – PPO | Admitting: Physician Assistant

## 2021-09-26 DIAGNOSIS — K5904 Chronic idiopathic constipation: Secondary | ICD-10-CM | POA: Diagnosis not present

## 2021-09-26 DIAGNOSIS — K219 Gastro-esophageal reflux disease without esophagitis: Secondary | ICD-10-CM | POA: Diagnosis not present

## 2021-09-26 DIAGNOSIS — K449 Diaphragmatic hernia without obstruction or gangrene: Secondary | ICD-10-CM | POA: Diagnosis not present

## 2021-09-26 MED ORDER — ATORVASTATIN CALCIUM 40 MG PO TABS: 40.0000 mg | ORAL_TABLET | Freq: Every day | ORAL | 3 refills | Status: DC

## 2021-09-26 NOTE — Progress Notes (Signed)
Reviewed and agree with management plans. ? ?Oriana Horiuchi L. Josafat Enrico, MD, MPH  ?

## 2021-09-26 NOTE — Telephone Encounter (Signed)
Atorvastatin 40mg  QHS sent to pharmacy  Patient will STOP rosuvastatin 5mg  + repatha   If she fails atorvastatin (and will have had therapeutic failure to rosuvastatin monotherapy and/or SE) then can resubmit PA

## 2021-09-26 NOTE — Addendum Note (Signed)
Addended by: Fidel Levy on: 09/26/2021 08:47 AM   Modules accepted: Orders

## 2021-09-26 NOTE — Progress Notes (Signed)
Subjective:    Patient ID: Yvonne [name], female    DOB: 1970-09-08, 51 y.o.   MRN: [id number]  HPI This service was provided via telemedicine.  video call. The patient was located at home. The provider was located in provider's GI office. The patient did consent to this telephone visit and is aware of possible charges with her insurance for this visit. To persons participating in this telemedicine service were the patient and I. Time spent on call;  20 min  Yvonne [name] is a pleasant 51 year old female, established with Dr. Tarri Glenn, who underwent very recent EGD on 09/14/2021 for complaints of belching and indigestion She was found to have a medium size hiatal hernia and otherwise normal exam.  Biopsies were taken from the esophagus stomach and duodenum and all were unremarkable.  Esophageal biopsies showed normal squamous mucosa with no significant changes of acid reflux.  Patient has concerns about finding of the hiatal hernia, and what that means, and also has questions about chronic constipation/straining. Patient has been taking omeprazole 40 mg p.o. every morning over the past 6 weeks and says that is definitely helping her symptoms of belching and burping.  She has no current complaints of heartburn, and no dysphagia.  She is concerned about the hiatal hernia and says that she had been doing some reading and learned that increased straining etc. could worsen the hiatal hernia.  She says that she has had chronic long-term constipation and may go 5 or 6 days between bowel movements.  She is currently on a regimen of Citrucel and fiber Gummies per her PCP and uses milk of magnesia as needed when she gets to 3 to 4 days without a bowel movement.  She says the milk of magnesia is effective.  She tried MiraLAX in the past but says that she does not like it, and that it causes very pasty stools which are difficult to clean.  She had taken Dulcolax in the past and is not sure that that was very  beneficial.  She does not have any current complaints of abdominal pain. She did have colonoscopy in March 2021 which was unremarkable with exception of one 1 mm polyp in the descending colon which by path was benign colonic mucosa. She does describe some intermittent nausea.  She is currently taking phentermine daily and also is on Ozempic for an elevated A1c.  She says she tried to track whether the nausea episodes were coinciding with dosing of the Ozempic but does not think that necessarily correlate.  She also feels that she was having belching and burping issues prior to starting Ozempic last summer.  Review of Systems Pertinent positive and negative review of systems were noted in the above HPI section.  All other review of systems was otherwise negative.   Outpatient Encounter Medications as of 09/26/2021  Medication Sig   Adapalene 0.3 % gel Apply 1 application topically as needed (zits).    atorvastatin (LIPITOR) 40 MG tablet Take 1 tablet (40 mg total) by mouth daily.   Cyanocobalamin (VITAMIN B 12 PO) Take 1 tablet by mouth daily.    Evolocumab (REPATHA SURECLICK) 096 MG/ML SOAJ Inject 1 Dose into the skin every 14 (fourteen) days.   ipratropium (ATROVENT) 0.03 % nasal spray PLACE 1 SPRAY INTO BOTH NOSTRILS DAILY (Patient taking differently: Place 2 sprays into both nostrils daily.)   MAGNESIUM PO Take 1 tablet by mouth daily.   Melatonin 5 MG CHEW Chew by mouth as needed.   Multiple Vitamins-Minerals (  CENTRUM ADULTS PO) Take 1 tablet by mouth daily.   Omega-3 Fatty Acids (FISH OIL PO) Take 1 capsule by mouth daily.   omeprazole (PRILOSEC) 40 MG capsule Take 1 capsule (40 mg total) by mouth daily.   PARoxetine (PAXIL) 20 MG tablet TAKE 1 TABLET BY MOUTH EVERY DAY (Patient taking differently: Take 10 mg by mouth daily.)   phentermine 37.5 MG capsule Take 1 capsule (37.5 mg total) by mouth every morning.   Semaglutide, 1 MG/DOSE, (OZEMPIC, 1 MG/DOSE,) 4 MG/3ML SOPN Inject 1 mg into the  skin once a week.   valACYclovir (VALTREX) 1000 MG tablet TAKE 1 TABLET BY MOUTH EVERY 12 HOURS   No facility-administered encounter medications on file as of 09/26/2021.   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   Synovitis of knee 10/13/2020   Acute medial meniscal tear, left, initial encounter 09/22/2020   Constipation 06/21/2019   Mixed hyperlipidemia 12/15/2018   Depression 12/15/2018   S/P laparoscopic hysterectomy - 06/15/12 06/23/2012   Social History   Socioeconomic History   Marital status: Divorced    Spouse name: Not on file   Number of children: 2   Years of education: Not on file   Highest education level: Not on file  Occupational History   Occupation: homemaker    Employer: [employer]  Tobacco Use   Smoking status: Never   Smokeless tobacco: Never  Vaping Use   Vaping Use: Never used  Substance and Sexual Activity   Alcohol use: Yes    Alcohol/week: 6.0 standard drinks    Types: 6 Glasses of wine per week    Comment: wine daily   Drug use: No   Sexual activity: Yes    Birth control/protection: Surgical    Comment: BTL  Other Topics Concern   Not on file  Social History Narrative   Not on file   Social Determinants of Health   Financial Resource Strain: Not on file  Food Insecurity: No Food Insecurity   Worried About Charity fundraiser in the Last Year: Never true   Ran Out of Food in the Last Year: Never true  Transportation Needs: Not on file  Physical Activity: Not on file  Stress: Not on file  Social Connections: Not on file  Intimate Partner Violence: Not on file    Ms. Miguez's family history includes Alcohol abuse in her brother and father; Crohn's disease in an other family member; Diabetes in her brother and father; Heart attack in her  maternal grandfather; Heart disease in her mother; Hyperlipidemia in an other family member; Hypertension in her brother, brother, and mother; Irritable bowel syndrome in an other family member; Stomach cancer in her maternal grandfather.      Objective:    There were no vitals filed for this visit.  Physical Exam not examined/virtual visit       Assessment & Plan:   #31 51 year old female with complaint of belching and indigestion, very recent EGD on 09/14/2021 with finding of medium size hiatal hernia and otherwise negative exam. Symptomatically improved on omeprazole 40 mg p.o. every morning   Suspect that her symptoms are likely multifactorial with component of chronic GERD, and likely medication side effects contributing from Ozempic.  #2 colon cancer screening-up-to-date with last colonoscopy March 2021-negative #3  chronic constipation-long-term Current regimen of Citrucel daily and fiber Gummies daily as well as liberal water intake.  Patient still struggling with straining, hard stools and will go at least 3 days between bowel movements  Plan; discussed general management of hiatal hernias with the patient, anatomy of hiatal hernia. She may want to try coming off of PPI therapy moving forward in the future.  Advised for step would be to back off to OTC omeprazole 20 mg p.o. every morning and if she does well with this for a month or 2 then try stopping PPI. This would need to be in conjunction with tight antireflux regimen and antireflux diet.  We will mail her copies of both.  Regarding her chronic constipation, advised to continue at least 60 ounces of water per day, continue daily fiber supplement, add trial of Senokot 1-2 at bedtime to be taken regularly.  We also discussed option of prescription medication i.e. Linzess or Amitiza.  She would like to try the Senokot first and if this is ineffective will call back and at that point we will try her on low-dose Linzess 72 mcg  daily to start.  Patient will follow-up with Dr. Tarri Glenn or myself on an as-needed basis   Tenille Morrill Genia Harold PA-C 09/26/2021   Cc: Minette Brine, FNP

## 2021-09-26 NOTE — Telephone Encounter (Unsigned)
Ms. Yvonne [name], Yvonne [name] are scheduled for a virtual visit with your provider today.    Just as we do with appointments in the office, we must obtain your consent to participate.  Your consent will be active for this visit and any virtual visit you may have with one of our providers in the next 365 days.    If you have a MyChart account, I can also send a copy of this consent to you electronically.  All virtual visits are billed to your insurance company just like a traditional visit in the office.  As this is a virtual visit, video technology does not allow for your provider to perform a traditional examination.  This may limit your provider's ability to fully assess your condition.  If your provider identifies any concerns that need to be evaluated in person or the need to arrange testing such as labs, EKG, etc, we will make arrangements to do so.    Although advances in technology are sophisticated, we cannot ensure that it will always work on either your end or our end.  If the connection with a video visit is poor, we may have to switch to a telephone visit.  With either a video or telephone visit, we are not always able to ensure that we have a secure connection.   I need to obtain your verbal consent now.   Are you willing to proceed with your visit today?   {Click here.  Press F2 and choose YES or NO                  :[id number]   Nicoletta Ba, PA-C 09/26/2021  3:06 PM

## 2021-10-04 ENCOUNTER — Encounter: Payer: Self-pay | Admitting: Nurse Practitioner

## 2021-10-04 ENCOUNTER — Other Ambulatory Visit: Payer: Self-pay

## 2021-10-04 ENCOUNTER — Ambulatory Visit: Payer: BC Managed Care – PPO | Admitting: Nurse Practitioner

## 2021-10-04 VITALS — BP 128/72 | HR 89 | Temp 98.1°F | Ht 64.0 in | Wt 157.6 lb

## 2021-10-04 DIAGNOSIS — Z6827 Body mass index (BMI) 27.0-27.9, adult: Secondary | ICD-10-CM | POA: Diagnosis not present

## 2021-10-04 DIAGNOSIS — E782 Mixed hyperlipidemia: Secondary | ICD-10-CM | POA: Diagnosis not present

## 2021-10-04 DIAGNOSIS — L0291 Cutaneous abscess, unspecified: Secondary | ICD-10-CM | POA: Diagnosis not present

## 2021-10-04 MED ORDER — PHENTERMINE HCL 37.5 MG PO CAPS: 37.5000 mg | ORAL_CAPSULE | Freq: Every morning | ORAL | 1 refills | Status: DC

## 2021-10-04 NOTE — Progress Notes (Signed)
I,Tianna Badgett,acting as a Education administrator for [employer], FNP.,have documented all relevant documentation on the behalf of Minette Brine, FNP,as directed by  Minette Brine, FNP while in the presence of Minette Brine, [place].  This visit occurred during the SARS-CoV-2 public health emergency.  Safety protocols were in place, including screening questions prior to the visit, additional usage of staff PPE, and extensive cleaning of exam room while observing appropriate contact time as indicated for disinfecting solutions.  Subjective:     Patient ID: Yvonne [name] , female    DOB: 22-Jul-1971 , 51 y.o.   MRN: [id number]   Chief Complaint  Patient presents with   Diabetes   Weight Check    HPI  Pt here for weight check.   Wt Readings from Last 3 Encounters: 10/04/21 : 157 lb 9.6 oz (71.5 kg) 09/14/21 : 155 lb (70.3 kg) 08/28/21 : 155 lb 12.8 oz (70.7 kg)  Continues taking phentermine 37.5 mg, she is moving more but no true exercise. She tries to mostly eat home. She tries to stay away from sugar. She admits to not drinking enough water daily.       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    Hx: UTI (urinary tract infection) 05/06/2011   Hyperlipidemia    Hypertension    PONV (postoperative nausea and vomiting)    Vulvitis 02/04/2011   Yeast infection 06/19/2010     Family History  Problem Relation Age of Onset   Hypertension Mother    Heart disease Mother    Diabetes Father    Alcohol abuse Father    Diabetes Brother    Hypertension Brother    Alcohol abuse Brother    Heart attack Maternal Grandfather    Stomach cancer Maternal Grandfather    Hypertension Brother    Crohn's disease Other        mat 2nd cousin   Hyperlipidemia Other    Irritable bowel syndrome Other        mat. nephew   Colon cancer Neg Hx    Breast cancer Neg  Hx    Colon polyps Neg Hx    Esophageal cancer Neg Hx    Rectal cancer Neg Hx      Current Outpatient Medications:    Adapalene 0.3 % gel, Apply 1 application topically as needed (zits). , Disp: , Rfl:    atorvastatin (LIPITOR) 40 MG tablet, Take 1 tablet (40 mg total) by mouth daily., Disp: 90 tablet, Rfl: 3   Cyanocobalamin (VITAMIN B 12 PO), Take 1 tablet by mouth daily. , Disp: , Rfl:    Evolocumab (REPATHA SURECLICK) 932 MG/ML SOAJ, Inject 1 Dose into the skin every 14 (fourteen) days., Disp: 6 mL, Rfl: 3   ipratropium (ATROVENT) 0.03 % nasal spray, PLACE 1 SPRAY INTO BOTH NOSTRILS DAILY (Patient taking differently: Place 2 sprays into both nostrils daily.), Disp: 30 mL, Rfl: 1   MAGNESIUM PO, Take 1 tablet by mouth daily., Disp: , Rfl:    Melatonin 5 MG CHEW, Chew by mouth as needed., Disp: , Rfl:    Multiple Vitamins-Minerals (CENTRUM ADULTS PO), Take 1 tablet by mouth daily., Disp: , Rfl:    Omega-3 Fatty Acids (FISH OIL PO), Take 1 capsule by mouth daily., Disp: , Rfl:    omeprazole (PRILOSEC) 40 MG  capsule, Take 1 capsule (40 mg total) by mouth daily., Disp: 90 capsule, Rfl: 3   phentermine 37.5 MG capsule, Take 1 capsule (37.5 mg total) by mouth every morning., Disp: 30 capsule, Rfl: 1   Semaglutide, 1 MG/DOSE, (OZEMPIC, 1 MG/DOSE,) 4 MG/3ML SOPN, Inject 1 mg into the skin once a week., Disp: 9 mL, Rfl: 1   valACYclovir (VALTREX) 1000 MG tablet, TAKE 1 TABLET BY MOUTH EVERY 12 HOURS, Disp: 180 tablet, Rfl: 1   Allergies  Allergen Reactions   Statins Other (See Comments)    myalgias   Sulfa Antibiotics Itching     Review of Systems  Constitutional: Negative.   Respiratory: Negative.    Cardiovascular: Negative.   Gastrointestinal: Negative.   Neurological: Negative.   Psychiatric/Behavioral: Negative.      Today's Vitals   10/04/21 1447  BP: 128/72  Pulse: 89  Temp: 98.1 F (36.7 C)  TempSrc: Oral  Weight: 157 lb 9.6 oz (71.5 kg)  Height: 5\' 4"  (1.626 m)    Body mass index is 27.05 kg/m.  Wt Readings from Last 3 Encounters:  10/04/21 157 lb 9.6 oz (71.5 kg)  09/14/21 155 lb (70.3 kg)  08/28/21 155 lb 12.8 oz (70.7 kg)    Objective:  Physical Exam Vitals reviewed.  Constitutional:      General: She is not in acute distress.    Appearance: Normal appearance.  Cardiovascular:     Rate and Rhythm: Normal rate and regular rhythm.     Pulses: Normal pulses.     Heart sounds: Normal heart sounds. No murmur heard. Pulmonary:     Effort: Pulmonary effort is normal. No respiratory distress.     Breath sounds: Normal breath sounds. No wheezing.  Skin:    Capillary Refill: Capillary refill takes less than 2 seconds.     Comments: Back with firm area that has been inflamed over the last mongh  Neurological:     General: No focal deficit present.     Mental Status: She is alert and oriented to person, place, and time.     Cranial Nerves: No cranial nerve deficit.     Motor: No weakness.  Psychiatric:        Mood and Affect: Mood normal.        Thought Content: Thought content normal.        Judgment: Judgment normal.        Assessment And Plan:     1. Mixed hyperlipidemia Comments: She is not on Repatha due to insurance coverage. She is taking atorvastatin daily and tolerating.   2. BMI 27.0-27.9,adult Weight is up about 2 lbs, continue phentermine and encouraged to continue with regular exercise - phentermine 37.5 MG capsule; Take 1 capsule (37.5 mg total) by mouth every morning.  Dispense: 30 capsule; Refill: 1  3. Abscess Patient is requesting referral to general surgery for area to her back    Patient was given opportunity to ask questions. Patient verbalized understanding of the plan and was able to repeat key elements of the plan. All questions were answered to their satisfaction.  Minette Brine, FNP   I, Minette Brine, FNP, have reviewed all documentation for this visit. The documentation on 10/04/21 for the exam,  diagnosis, procedures, and orders are all accurate and complete.   IF YOU HAVE BEEN REFERRED TO A SPECIALIST, IT MAY TAKE 1-2 WEEKS TO SCHEDULE/PROCESS THE REFERRAL. IF YOU HAVE NOT HEARD FROM US/SPECIALIST IN TWO WEEKS, PLEASE GIVE [name] A CALL AT  [phone] X [phone].   THE PATIENT IS ENCOURAGED TO PRACTICE SOCIAL DISTANCING DUE TO THE COVID-19 PANDEMIC.

## 2021-10-04 NOTE — Patient Instructions (Signed)

## 2021-10-17 DIAGNOSIS — L810 Postinflammatory hyperpigmentation: Secondary | ICD-10-CM | POA: Diagnosis not present

## 2021-10-17 DIAGNOSIS — L720 Epidermal cyst: Secondary | ICD-10-CM | POA: Diagnosis not present

## 2021-10-29 DIAGNOSIS — E7849 Other hyperlipidemia: Secondary | ICD-10-CM | POA: Diagnosis not present

## 2021-10-30 ENCOUNTER — Other Ambulatory Visit: Payer: Self-pay | Admitting: Gastroenterology

## 2021-10-30 DIAGNOSIS — R142 Eructation: Secondary | ICD-10-CM

## 2021-10-30 LAB — NMR, LIPOPROFILE
Cholesterol, Total: 171 mg/dL (ref 100–199)
HDL Particle Number: 43.4 umol/L (ref >=30.5)
HDL-C: 89 mg/dL (ref >39)
LDL Particle Number: 534 nmol/L (ref <1000)
LDL Size: 22.3 nm (ref >20.5)
LDL-C (NIH Calc): 71 mg/dL (ref 0–99)
LP-IR Score: 29 (ref <=45)
Small LDL Particle Number: <90 nmol/L (ref <=527)
Triglycerides: 54 mg/dL (ref 0–149)

## 2021-11-05 ENCOUNTER — Telehealth: Payer: Self-pay | Admitting: *Deleted

## 2021-11-05 NOTE — Telephone Encounter (Signed)
Prior Auth submitted via Covermymeds for Omeprazole. ?

## 2021-11-06 ENCOUNTER — Ambulatory Visit (INDEPENDENT_AMBULATORY_CARE_PROVIDER_SITE_OTHER): Payer: BC Managed Care – PPO | Admitting: Internal Medicine

## 2021-11-06 ENCOUNTER — Encounter (HOSPITAL_BASED_OUTPATIENT_CLINIC_OR_DEPARTMENT_OTHER): Payer: Self-pay | Admitting: Internal Medicine

## 2021-11-06 ENCOUNTER — Encounter: Payer: Self-pay | Admitting: Nurse Practitioner

## 2021-11-06 ENCOUNTER — Other Ambulatory Visit: Payer: Self-pay

## 2021-11-06 VITALS — BP 138/80 | HR 81 | Ht 65.0 in | Wt 157.2 lb

## 2021-11-06 DIAGNOSIS — E7849 Other hyperlipidemia: Secondary | ICD-10-CM

## 2021-11-06 DIAGNOSIS — E7841 Elevated Lipoprotein(a): Secondary | ICD-10-CM

## 2021-11-06 NOTE — Progress Notes (Signed)
? ?LIPID CLINIC CONSULT NOTE ? ?Chief Complaint:  ?Follow-up dyslipidemia ? ?Primary Care Physician: ?Minette Brine, FNP ? ?Primary Cardiologist:  ?None ? ?HPI:  ?Yvonne [name] is a 51 y.o. female who is being seen today for the evaluation of dyslipidemia at the request of Minette Brine, [place].  This is a pleasant 51 year old female kindly referred for evaluation and management of dyslipidemia.  She reports history of elevated cholesterol however more recently her cholesterol has trended upwards.  This is also notable to be associated with weight gain, less activity due to struggles with left knee pain (she has an upcoming repeat knee surgery) and an atherogenic diet.  She reports a family history of heart disease including her maternal grandfather had an MI in his 66s and her mother who had high cholesterol and is of heart care patient.  Also more recently she tried a ketogenic diet.  This seems to correlate with her increasing cholesterol particularly her LDL.  Her most recent lipid profile showed total cholesterol 294, triglycerides 85, HDL 78 and LDL 202.  Hemoglobin A1c was 6.1.  She is not on treatment for diabetes or any medications for that. ? ?11/06/2021 ? ?Mrs. Alire returns today for follow-up.  Unfortunately, after changing insurance programs her Repatha was no longer covered.  We then had to consider another option.  Although she had had myalgias with statins before, she had not previously tried atorvastatin and she was willing to try it.  She was then placed on atorvastatin 40 mg daily.  Her lipids now actually do look excellent.  LDL particle #534, LDL-C71, HDL 89, triglycerides 54 and small LDL particle numbers are almost undetectable.  Unfortunately, she did have an elevated LP(a) which will not be reduced with a statin as it was with the PCSK9 inhibitor.  Nonetheless, she seems to be doing well with this therapy ? ?PMHx:  ?Past Medical History:  ?Diagnosis Date  ? Allergy   ? Depression   ?  Diabetes mellitus without complication ([place])   ? Fibroid 2005  ? H/O menorrhagia 01/16/2011  ? Herpes 2009  ? HPV in female   ? Hx: UTI (urinary tract infection) 05/06/2011  ? Hyperlipidemia   ? Hypertension   ? PONV (postoperative nausea and vomiting)   ? Vulvitis 02/04/2011  ? Yeast infection 06/19/2010  ? ? ?Past Surgical History:  ?Procedure Laterality Date  ? ABDOMINAL HYSTERECTOMY    ? CESAREAN SECTION  2008 & 2009  ? COLONOSCOPY  11/24/2019  ? CRYOTHERAPY  2009  ? CYSTOSCOPY  06/15/2012  ? Procedure: CYSTOSCOPY;  Surgeon: Delice Lesch, MD;  Location: [facility];  Service: Gynecology;  Laterality: N/A;  ? KNEE ARTHROSCOPY Left 05/16/2020  ? Procedure: LEFT KNEE ARTHROSCOPY, PARTIAL MENISCECTOMY;  Surgeon: Melrose Nakayama, MD;  Location: [facility];  Service: Orthopedics;  Laterality: Left;  ? KNEE ARTHROSCOPY WITH MEDIAL MENISECTOMY Left 10/13/2020  ? Procedure: LEFT KNEE ARTHROSCOPY WITH PARTIAL MEDIAL MENISCECTOMY;  Surgeon: Leandrew Koyanagi, MD;  Location: [facility];  Service: Orthopedics;  Laterality: Left;  ? LAPAROSCOPIC HYSTERECTOMY  06/15/2012  ? Procedure: HYSTERECTOMY TOTAL LAPAROSCOPIC;  Surgeon: Delice Lesch, MD;  Location: [facility];  Service: Gynecology;  Laterality: N/A;  ? MYOMECTOMY  2005  ? TUBAL LIGATION    ? bilateral  ? ? ?FAMHx:  ?Family History  ?Problem Relation Age of Onset  ? Hypertension Mother   ? Heart disease Mother   ? Diabetes Father   ? Alcohol abuse Father   ?  Diabetes Brother   ? Hypertension Brother   ? Alcohol abuse Brother   ? Heart attack Maternal Grandfather   ? Stomach cancer Maternal Grandfather   ? Hypertension Brother   ? Crohn's disease Other   ?     mat 2nd cousin  ? Hyperlipidemia Other   ? Irritable bowel syndrome Other   ?     mat. nephew  ? Colon cancer Neg Hx   ? Breast cancer Neg Hx   ? Colon polyps Neg Hx   ? Esophageal cancer Neg Hx   ? Rectal cancer Neg Hx   ? ? ?SOCHx:  ? reports that she has never smoked. She has never used smokeless tobacco. She  reports current alcohol use of about 6.0 standard drinks per week. She reports that she does not use drugs. ? ?ALLERGIES:  ?Allergies  ?Allergen Reactions  ? Statins Other (See Comments)  ?  myalgias  ? Sulfa Antibiotics Itching  ? ? ?ROS: ?Pertinent items noted in HPI and remainder of comprehensive ROS otherwise negative. ? ?HOME MEDS: ?Current Outpatient Medications on File Prior to Visit  ?Medication Sig Dispense Refill  ? Adapalene 0.3 % gel Apply 1 application topically as needed (zits).     ? atorvastatin (LIPITOR) 40 MG tablet Take 1 tablet (40 mg total) by mouth daily. 90 tablet 3  ? Cyanocobalamin (VITAMIN B 12 PO) Take 1 tablet by mouth daily.     ? ipratropium (ATROVENT) 0.03 % nasal spray PLACE 1 SPRAY INTO BOTH NOSTRILS DAILY (Patient taking differently: Place 2 sprays into both nostrils daily.) 30 mL 1  ? Melatonin 5 MG CHEW Chew by mouth as needed.    ? Multiple Vitamins-Minerals (CENTRUM ADULTS PO) Take 1 tablet by mouth daily.    ? Omega-3 Fatty Acids (FISH OIL PO) Take 1 capsule by mouth daily.    ? omeprazole (PRILOSEC) 40 MG capsule Take 1 capsule (40 mg total) by mouth daily. 90 capsule 3  ? phentermine 37.5 MG capsule Take 1 capsule (37.5 mg total) by mouth every morning. 30 capsule 1  ? Semaglutide, 1 MG/DOSE, (OZEMPIC, 1 MG/DOSE,) 4 MG/3ML SOPN Inject 1 mg into the skin once a week. 9 mL 1  ? valACYclovir (VALTREX) 1000 MG tablet TAKE 1 TABLET BY MOUTH EVERY 12 HOURS 180 tablet 1  ? Evolocumab (REPATHA SURECLICK) 749 MG/ML SOAJ Inject 1 Dose into the skin every 14 (fourteen) days. (Patient not taking: Reported on 11/06/2021) 6 mL 3  ? ?No current facility-administered medications on file prior to visit.  ? ? ?LABS/IMAGING: ?No results found for this or any previous visit (from the past 48 hour(s)). ?No results found. ? ?LIPID PANEL: ?   ?Component Value Date/Time  ? CHOL 272 (H) 02/02/2021 4496  ? TRIG 55 02/02/2021 0958  ? HDL 73 02/02/2021 0958  ? CHOLHDL 3.7 02/02/2021 0958  ? [place] 191  (H) 02/02/2021 7591  ? ? ?WEIGHTS: ?Wt Readings from Last 3 Encounters:  ?11/06/21 157 lb 3.2 oz (71.3 kg)  ?10/04/21 157 lb 9.6 oz (71.5 kg)  ?09/14/21 155 lb (70.3 kg)  ? ? ?VITALS: ?BP 138/80   Pulse 81   Ht '5\' 5"'$  (1.651 m)   Wt 157 lb 3.2 oz (71.3 kg)   LMP 05/25/2012   SpO2 99%   BMI 26.16 kg/m?  ? ?EXAM: ?Deferred ? ?EKG: ?Deferred ? 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 ?PLAN: ?1.   Ms. 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 This was an ideal option because of her high LP(a) which is not affected by the statin.  Lipid numbers however have improved significantly and are now at goal.  We will plan to continue her current dose of the statin which she seems to be tolerating.  Ultimately, she may need specific therapy for her high LP(a). ? ?Repeat lipids in 6 months and follow-up afterwards ? ?Pixie Casino, MD, [facility], FACP  ?[place]  ?Medical Director of the Advanced Lipid Disorders &  ?Cardiovascular Risk Reduction Clinic ?Diplomate of the [employer] of Clinical Lipidology ?Attending Cardiologist  ?Direct Dial: [phone]  Fax: [phone]  ?Website:  [email or web address] ? ?Nadean Corwin Jailan Trimm ?11/06/2021, 11:21 AM ?

## 2021-11-06 NOTE — Patient Instructions (Signed)
Medication Instructions:  ?Your physician recommends that you continue on your current medications as directed. Please refer to the Current Medication list given to you today. ? ?*If you need a refill on your cardiac medications before your next appointment, please call your pharmacy* ? ? ?Lab Work: ?FASTING lab work to check cholesterol in about 6 months ?-- about 1 week before your next visit ? ?If you have labs (blood work) drawn today and your tests are completely normal, you will receive your results only by: ?MyChart Message (if you have MyChart) OR ?A paper copy in the mail ?If you have any lab test that is abnormal or we need to change your treatment, we will call you to review the results. ? ? ?Follow-Up: ?At [facility], you and your health needs are our priority.  As part of our continuing mission to provide you with exceptional heart care, we have created designated Provider Care Teams.  These Care Teams include your primary Cardiologist (physician) and Advanced Practice Providers (APPs -  Physician Assistants and Nurse Practitioners) who all work together to provide you with the care you need, when you need it. ? ?We recommend signing up for the patient portal called "MyChart".  Sign up information is provided on this After Visit Summary.  MyChart is used to connect with patients for Virtual Visits (Telemedicine).  Patients are able to view lab/test results, encounter notes, upcoming appointments, etc.  Non-urgent messages can be sent to your provider as well.   ?To learn more about what you can do with MyChart, go to [email or web address].   ? ?Your next appointment:   ? ?6 months with Dr. Debara Pickett -- lipid clinic ? ?

## 2021-12-12 ENCOUNTER — Ambulatory Visit: Payer: BC Managed Care – PPO | Admitting: Nurse Practitioner

## 2021-12-12 ENCOUNTER — Encounter: Payer: Self-pay | Admitting: Nurse Practitioner

## 2021-12-12 VITALS — HR 79 | Temp 98.6°F | Ht 65.0 in | Wt 153.0 lb

## 2021-12-12 DIAGNOSIS — R7303 Prediabetes: Secondary | ICD-10-CM | POA: Diagnosis not present

## 2021-12-12 DIAGNOSIS — E782 Mixed hyperlipidemia: Secondary | ICD-10-CM | POA: Diagnosis not present

## 2021-12-12 DIAGNOSIS — Z23 Encounter for immunization: Secondary | ICD-10-CM

## 2021-12-12 DIAGNOSIS — Z6825 Body mass index (BMI) 25.0-25.9, adult: Secondary | ICD-10-CM

## 2021-12-12 MED ORDER — OZEMPIC (1 MG/DOSE) 4 MG/3ML ~~LOC~~ SOPN: 1.0000 mg | PEN_INJECTOR | SUBCUTANEOUS | 1 refills | Status: DC

## 2021-12-12 MED ORDER — PHENTERMINE HCL 37.5 MG PO CAPS: 37.5000 mg | ORAL_CAPSULE | Freq: Every morning | ORAL | 1 refills | Status: DC

## 2021-12-12 NOTE — Assessment & Plan Note (Signed)
Continue follow up with lipid clinic, tolerating atorvastatin well. LDLs have improved since starting medication.  ?

## 2021-12-12 NOTE — Patient Instructions (Addendum)
Diabetes Mellitus and Exercise ?Exercising regularly is important for overall health, especially for people who have diabetes mellitus. Exercising is not only about losing weight. It has many other health benefits, such as increasing muscle strength and bone density and reducing body fat and stress. This leads to improved fitness, flexibility, and endurance, all of which result in better overall health. ?What are the benefits of exercise if I have diabetes? ?Exercise has many benefits for people with diabetes. They include: ?Helping to lower and control blood sugar (glucose). ?Helping the body to respond better to the hormone insulin by improving insulin sensitivity. ?Reducing how much insulin the body needs. ?Lowering the risk for heart disease by: ?Lowering "bad" cholesterol and triglyceride levels. ?Increasing "good" cholesterol levels. ?Lowering blood pressure. ?Lowering blood glucose levels. ?What is my activity plan? ?Your health care provider or certified diabetes educator can help you make a plan for the type and frequency of exercise that works for you. This is called your activity plan. Be sure to: ?Get at least 150 minutes of medium-intensity or high-intensity exercise each week. Exercises may include brisk walking, biking, or water aerobics. ?Do stretching and strengthening exercises, such as yoga or weight lifting, at least 2 times a week. ?Spread out your activity over at least 3 days of the week. ?Get some form of physical activity each day. ?Do not go more than 2 days in a row without some kind of physical activity. ?Avoid being inactive for more than 90 minutes at a time. Take frequent breaks to walk or stretch. ?Choose exercises or activities that you enjoy. Set realistic goals. ?Start slowly and gradually increase your exercise intensity over time. ?How do I manage my diabetes during exercise? ? ?Monitor your blood glucose ?Check your blood glucose before and after exercising. If your blood  glucose is: ?240 mg/dL (13.3 mmol/L) or higher before you exercise, check your urine for ketones. These are chemicals created by the liver. If you have ketones in your urine, do not exercise until your blood glucose returns to normal. ?100 mg/dL (5.6 mmol/L) or lower, eat a snack containing 15-20 grams of carbohydrate. Check your blood glucose 15 minutes after the snack to make sure that your glucose level is above 100 mg/dL (5.6 mmol/L) before you start your exercise. ?Know the symptoms of low blood glucose (hypoglycemia) and how to treat it. Your risk for hypoglycemia increases during and after exercise. ?Follow these tips and your health care provider's instructions ?Keep a carbohydrate snack that is fast-acting for use before, during, and after exercise to help prevent or treat hypoglycemia. ?Avoid injecting insulin into areas of the body that are going to be exercised. For example, avoid injecting insulin into: ?Your arms, when you are about to play tennis. ?Your legs, when you are about to go jogging. ?Keep records of your exercise habits. Doing this can help you and your health care provider adjust your diabetes management plan as needed. Write down: ?Food that you eat before and after you exercise. ?Blood glucose levels before and after you exercise. ?The type and amount of exercise you have done. ?Work with your health care provider when you start a new exercise or activity. He or she may need to: ?Make sure that the activity is safe for you. ?Adjust your insulin, other medicines, and food that you eat. ?Drink plenty of water while you exercise. This prevents loss of water (dehydration) and problems caused by a lot of heat in the body (heat stroke). ?Where to find more   information ?American Diabetes Association: www.diabetes.org ?Summary ?Exercising regularly is important for overall health, especially for people who have diabetes mellitus. ?Exercising has many health benefits. It increases muscle strength  and bone density and reduces body fat and stress. It also lowers and controls blood glucose. ?Your health care provider or certified diabetes educator can help you make an activity plan for the type and frequency of exercise that works for you. ?Work with your health care provider to make sure any new activity is safe for you. Also work with your health care provider to adjust your insulin, other medicines, and the food you eat. ?This information is not intended to replace advice given to you by your health care provider. Make sure you discuss any questions you have with your health care provider. ?Document Revised: 05/10/2019 Document Reviewed: 05/10/2019 ?Elsevier Patient Education ? [place]. ? ? ?Zoster Vaccine, Recombinant injection ?What is this medication? ?ZOSTER VACCINE (ZOS ter vak SEEN) is a vaccine used to reduce the risk of getting shingles. This vaccine is not used to treat shingles or nerve pain from shingles. ?This medicine may be used for other purposes; ask your health care provider or pharmacist if you have questions. ?COMMON BRAND NAME(S): SHINGRIX ?What should I tell my care team before I take this medication? ?They need to know if you have any of these conditions: ?cancer ?immune system problems ?an unusual or allergic reaction to Zoster vaccine, other medications, foods, dyes, or preservatives ?pregnant or trying to get pregnant ?breast-feeding ?How should I use this medication? ?This vaccine is injected into a muscle. It is given by a health care provider. ?A copy of Vaccine Information Statements will be given before each vaccination. Be sure to read this information carefully each time. This sheet may change often. ?Talk to your health care provider about the use of this vaccine in children. This vaccine is not approved for use in children. ?Overdosage: If you think you have taken too much of this medicine contact a poison control center or emergency room at once. ?NOTE: This  medicine is only for you. Do not share this medicine with others. ?What if I miss a dose? ?Keep appointments for follow-up (booster) doses. It is important not to miss your dose. Call your health care provider if you are unable to keep an appointment. ?What may interact with this medication? ?medicines that suppress your immune system ?medicines to treat cancer ?steroid medicines like prednisone or cortisone ?This list may not describe all possible interactions. Give your health care provider a list of all the medicines, herbs, non-prescription drugs, or dietary supplements you use. Also tell them if you smoke, drink alcohol, or use illegal drugs. Some items may interact with your medicine. ?What should I watch for while using this medication? ?Visit your health care provider regularly. ?This vaccine, like all vaccines, may not fully protect everyone. ?What side effects may I notice from receiving this medication? ?Side effects that you should report to your doctor or health care professional as soon as possible: ?allergic reactions (skin rash, itching or hives; swelling of the face, lips, or tongue) ?trouble breathing ?Side effects that usually do not require medical attention (report these to your doctor or health care professional if they continue or are bothersome): ?chills ?headache ?fever ?nausea ?pain, redness, or irritation at site where injected ?tiredness ?vomiting ?This list may not describe all possible side effects. Call your doctor for medical advice about side effects. You may report side effects to FDA at 1-800-FDA-1088. ?  Where should I keep my medication? ?This vaccine is only given by a health care provider. It will not be stored at home. ?NOTE: This sheet is a summary. It may not cover all possible information. If you have questions about this medicine, talk to your doctor, pharmacist, or health care provider. ?? 2023 Elsevier/Gold Standard (2021-07-13 00:00:00) ? ?

## 2021-12-12 NOTE — Progress Notes (Signed)
?Industrial/product designer as a Education administrator for [employer], FNP.,have documented all relevant documentation on the behalf of Minette Brine, FNP,as directed by  Minette Brine, FNP while in the presence of Minette Brine, [place]. ? ?This visit occurred during the SARS-CoV-2 public health emergency.  Safety protocols were in place, including screening questions prior to the visit, additional usage of staff PPE, and extensive cleaning of exam room while observing appropriate contact time as indicated for disinfecting solutions. ? ?Subjective:  ?  ? Patient ID: Yvonne [name] , female    DOB: 12-28-1970 , 51 y.o.   MRN: [id number] ? ? ?Chief Complaint  ?Patient presents with  ? Diabetes  ? ? ?HPI ? ?Patient presents today for dm follow up. \ ? ? ?Wt Readings from Last 3 Encounters: ?12/12/21 : 153 lb (69.4 kg) ?11/06/21 : 157 lb 3.2 oz (71.3 kg) ?10/04/21 : 157 lb 9.6 oz (71.5 kg) ? ? ? ?Diabetes ?She presents for her follow-up diabetic visit. Diabetes type: prediabetes. There are no hypoglycemic associated symptoms. There are no diabetic associated symptoms. Risk factors for coronary artery disease include sedentary lifestyle. Current diabetic treatment includes oral agent (monotherapy). She is following a generally healthy diet. When asked about meal planning, she reported none. She has not had a previous visit with a dietitian. (Not checking regularly) She does not see a podiatrist.Eye exam is not current.   ? ?Past Medical History:  ?Diagnosis Date  ? Allergy   ? Depression   ? Diabetes mellitus without complication ([place])   ? Fibroid 2005  ? H/O menorrhagia 01/16/2011  ? Herpes 2009  ? HPV in female   ? Hx: UTI (urinary tract infection) 05/06/2011  ? Hyperlipidemia   ? Hypertension   ? PONV (postoperative nausea and vomiting)   ? Vulvitis 02/04/2011  ? Yeast infection 06/19/2010  ?  ? ?Family History  ?Problem Relation Age of Onset  ? Hypertension Mother   ? Heart disease Mother   ? Diabetes Father   ? Alcohol abuse Father   ?  Diabetes Brother   ? Hypertension Brother   ? Alcohol abuse Brother   ? Heart attack Maternal Grandfather   ? Stomach cancer Maternal Grandfather   ? Hypertension Brother   ? Crohn's disease Other   ?     mat 2nd cousin  ? Hyperlipidemia Other   ? Irritable bowel syndrome Other   ?     mat. nephew  ? Colon cancer Neg Hx   ? Breast cancer Neg Hx   ? Colon polyps Neg Hx   ? Esophageal cancer Neg Hx   ? Rectal cancer Neg Hx   ? ? ? ?Current Outpatient Medications:  ?  Adapalene 0.3 % gel, Apply 1 application topically as needed (zits). , Disp: , Rfl:  ?  atorvastatin (LIPITOR) 40 MG tablet, Take 1 tablet (40 mg total) by mouth daily., Disp: 90 tablet, Rfl: 3 ?  Cyanocobalamin (VITAMIN B 12 PO), Take 1 tablet by mouth daily. , Disp: , Rfl:  ?  ipratropium (ATROVENT) 0.03 % nasal spray, PLACE 1 SPRAY INTO BOTH NOSTRILS DAILY (Patient taking differently: Place 2 sprays into both nostrils daily.), Disp: 30 mL, Rfl: 1 ?  Melatonin 5 MG CHEW, Chew by mouth as needed., Disp: , Rfl:  ?  Multiple Vitamins-Minerals (CENTRUM ADULTS PO), Take 1 tablet by mouth daily., Disp: , Rfl:  ?  Omega-3 Fatty Acids (FISH OIL PO), Take 1 capsule by mouth daily., Disp: , Rfl:  ?  omeprazole (PRILOSEC) 40 MG capsule, Take 1 capsule (40 mg total) by mouth daily., Disp: 90 capsule, Rfl: 3 ?  phentermine 37.5 MG capsule, Take 1 capsule (37.5 mg total) by mouth every morning., Disp: 30 capsule, Rfl: 1 ?  Semaglutide, 1 MG/DOSE, (OZEMPIC, 1 MG/DOSE,) 4 MG/3ML SOPN, Inject 1 mg into the skin once a week., Disp: 9 mL, Rfl: 1 ?  valACYclovir (VALTREX) 1000 MG tablet, TAKE 1 TABLET BY MOUTH EVERY 12 HOURS, Disp: 180 tablet, Rfl: 1  ? ?Allergies  ?Allergen Reactions  ? Statins Other (See Comments)  ?  myalgias  ? Sulfa Antibiotics Itching  ?  ? ?Review of Systems  ?Constitutional: Negative.   ?Respiratory: Negative.    ?Cardiovascular: Negative.   ?Gastrointestinal: Negative.   ?Neurological: Negative.    ? ?Today's Vitals  ? 12/12/21 1147  ?Pulse: 79   ?Temp: 98.6 ?F (37 ?C)  ?TempSrc: Oral  ?Weight: 153 lb (69.4 kg)  ?Height: '5\' 5"'$  (1.651 m)  ? ?Body mass index is 25.46 kg/m?.  ?Wt Readings from Last 3 Encounters:  ?12/12/21 153 lb (69.4 kg)  ?11/06/21 157 lb 3.2 oz (71.3 kg)  ?10/04/21 157 lb 9.6 oz (71.5 kg)  ? ? ?Objective:  ?Physical Exam ?Vitals reviewed.  ?Constitutional:   ?   General: She is not in acute distress. ?   Appearance: Normal appearance.  ?Cardiovascular:  ?   Rate and Rhythm: Normal rate and regular rhythm.  ?   Pulses: Normal pulses.  ?   Heart sounds: Normal heart sounds. No murmur heard. ?Pulmonary:  ?   Effort: Pulmonary effort is normal. No respiratory distress.  ?   Breath sounds: Normal breath sounds. No wheezing.  ?Skin: ?   Capillary Refill: Capillary refill takes less than 2 seconds.  ?Neurological:  ?   General: No focal deficit present.  ?   Mental Status: She is alert and oriented to person, place, and time.  ?   Cranial Nerves: No cranial nerve deficit.  ?   Motor: No weakness.  ?Psychiatric:     ?   Mood and Affect: Mood normal.     ?   Thought Content: Thought content normal.     ?   Judgment: Judgment normal.  ?  ? ?   ?Assessment And Plan:  ?   ?1. Prediabetes ?Comments: Stable, continue Ozempic, tolerating well. ?- Hemoglobin A1c ?- Semaglutide, 1 MG/DOSE, (OZEMPIC, 1 MG/DOSE,) 4 MG/3ML SOPN; Inject 1 mg into the skin once a week.  Dispense: 9 mL; Refill: 1 ? ?2. Mixed hyperlipidemia ?Comments: She is now on Atorvastatin, tolerating well. Continue to limit intake of fried and fatty foods ? ?3. BMI 25.0-25.9,adult ?Chronic ?Discussed healthy diet and regular exercise options  ?Encouraged to exercise at least 150 minutes per week with 2 days of strength training ?Continue phentermine ?Return in 2 months for weight check. ?She has had a 4 lb weight loss since last visit. ?- phentermine 37.5 MG capsule; Take 1 capsule (37.5 mg total) by mouth every morning.  Dispense: 30 capsule; Refill: 1 ? ?4. Encounter for  immunization ?Shingrix #1 administered in office ?- Varicella-zoster vaccine IM (Shingrix) ? ?  ? ? ?Patient was given opportunity to ask questions. Patient verbalized understanding of the plan and was able to repeat key elements of the plan. All questions were answered to their satisfaction.  ?Minette Brine, FNP  ? ? I, Minette Brine, FNP, have reviewed all documentation for this visit. The documentation on 12/12/21 for the  exam, diagnosis, procedures, and orders are all accurate and complete. ? ?IF YOU HAVE BEEN REFERRED TO A SPECIALIST, IT MAY TAKE 1-2 WEEKS TO SCHEDULE/PROCESS THE REFERRAL. IF YOU HAVE NOT HEARD FROM US/SPECIALIST IN TWO WEEKS, PLEASE GIVE [name] A CALL AT [phone] X [phone].  ? ?THE PATIENT IS ENCOURAGED TO PRACTICE SOCIAL DISTANCING DUE TO THE COVID-19 PANDEMIC.   ?

## 2021-12-12 NOTE — Assessment & Plan Note (Signed)
Chronic, improving with Ozempic. Continue current medications  ?

## 2021-12-13 DIAGNOSIS — M1712 Unilateral primary osteoarthritis, left knee: Secondary | ICD-10-CM | POA: Diagnosis not present

## 2021-12-13 LAB — HEMOGLOBIN A1C
Est. average glucose Bld gHb Est-mCnc: 114 mg/dL
Hgb A1c MFr Bld: 5.6 % (ref 4.8–5.6)

## 2021-12-24 DIAGNOSIS — M79672 Pain in left foot: Secondary | ICD-10-CM | POA: Diagnosis not present

## 2021-12-24 DIAGNOSIS — H16223 Keratoconjunctivitis sicca, not specified as Sjogren's, bilateral: Secondary | ICD-10-CM | POA: Diagnosis not present

## 2021-12-26 ENCOUNTER — Encounter: Payer: Self-pay | Admitting: Nurse Practitioner

## 2022-02-12 ENCOUNTER — Ambulatory Visit: Payer: BC Managed Care – PPO | Admitting: Nurse Practitioner

## 2022-03-27 DIAGNOSIS — M1712 Unilateral primary osteoarthritis, left knee: Secondary | ICD-10-CM | POA: Diagnosis not present

## 2022-04-03 ENCOUNTER — Encounter: Payer: Self-pay | Admitting: Nurse Practitioner

## 2022-04-24 ENCOUNTER — Encounter: Payer: Self-pay | Admitting: Nurse Practitioner

## 2022-04-24 ENCOUNTER — Ambulatory Visit: Payer: BC Managed Care – PPO | Admitting: Nurse Practitioner

## 2022-04-24 VITALS — BP 128/68 | HR 87 | Temp 98.7°F | Ht 65.0 in | Wt 156.0 lb

## 2022-04-24 DIAGNOSIS — Z23 Encounter for immunization: Secondary | ICD-10-CM | POA: Diagnosis not present

## 2022-04-24 DIAGNOSIS — M542 Cervicalgia: Secondary | ICD-10-CM | POA: Diagnosis not present

## 2022-04-24 MED ORDER — CYCLOBENZAPRINE HCL 10 MG PO TABS: 10.0000 mg | ORAL_TABLET | Freq: Three times a day (TID) | ORAL | 0 refills | Status: DC | PRN

## 2022-04-24 NOTE — Progress Notes (Signed)
I,Tianna Badgett,acting as a Education administrator for [employer], FNP.,have documented all relevant documentation on the behalf of Minette Brine, FNP,as directed by  Minette Brine, FNP while in the presence of Minette Brine, [place].  Subjective:     Patient ID: Yvonne [name] , female    DOB: 1971-02-01 , 51 y.o.   MRN: [id number]   Chief Complaint  Patient presents with   Neck Pain    HPI  Patient presents today for DM follow up. She has complaints of neck pain began 1.5 months ago. Trying to do a ponytail will have pain. She is having gripping pain when she uses the left hand to pull anything up she will have pain at night. She has switched to a track ball and changed chairs.   Neck Pain  The pain is moderate. The symptoms are aggravated by bending and twisting. Associated symptoms include numbness (posterior left arm - does not like to drive anymore). Pertinent negatives include no chest pain or fever. She has tried NSAIDs for the symptoms. The treatment provided mild relief.     Past Medical History:  Diagnosis Date   Allergy    Depression    Diabetes mellitus without complication ([place])    Fibroid 2005   H/O menorrhagia 01/16/2011   Herpes 2009   HPV in female    Hx: UTI (urinary tract infection) 05/06/2011   Hyperlipidemia    Hypertension    PONV (postoperative nausea and vomiting)    Vulvitis 02/04/2011   Yeast infection 06/19/2010     Family History  Problem Relation Age of Onset   Hypertension Mother    Heart disease Mother    Diabetes Father    Alcohol abuse Father    Diabetes Brother    Hypertension Brother    Alcohol abuse Brother    Heart attack Maternal Grandfather    Stomach cancer Maternal Grandfather    Hypertension Brother    Crohn's disease Other        mat 2nd cousin   Hyperlipidemia Other    Irritable bowel syndrome Other        mat. nephew   Colon cancer Neg Hx    Breast cancer Neg Hx    Colon polyps Neg Hx    Esophageal cancer Neg Hx    Rectal cancer Neg  Hx      Current Outpatient Medications:    cyclobenzaprine (FLEXERIL) 10 MG tablet, Take 1 tablet (10 mg total) by mouth 3 (three) times daily as needed for muscle spasms., Disp: 30 tablet, Rfl: 0   Semaglutide, 1 MG/DOSE, (OZEMPIC, 1 MG/DOSE,) 4 MG/3ML SOPN, Inject 1 mg into the skin once a week., Disp: 9 mL, Rfl: 1   valACYclovir (VALTREX) 1000 MG tablet, TAKE 1 TABLET BY MOUTH EVERY 12 HOURS, Disp: 180 tablet, Rfl: 1   Adapalene 0.3 % gel, Apply 1 application topically as needed (zits). , Disp: , Rfl:    atorvastatin (LIPITOR) 40 MG tablet, Take 1 tablet (40 mg total) by mouth daily., Disp: 90 tablet, Rfl: 3   Cyanocobalamin (VITAMIN B 12 PO), Take 1 tablet by mouth daily. , Disp: , Rfl:    ipratropium (ATROVENT) 0.03 % nasal spray, PLACE 1 SPRAY INTO BOTH NOSTRILS DAILY (Patient taking differently: Place 2 sprays into both nostrils daily.), Disp: 30 mL, Rfl: 1   Melatonin 5 MG CHEW, Chew by mouth as needed., Disp: , Rfl:    Multiple Vitamins-Minerals (CENTRUM ADULTS PO), Take 1 tablet by mouth daily., Disp: , Rfl:  Omega-3 Fatty Acids (FISH OIL PO), Take 1 capsule by mouth daily., Disp: , Rfl:    omeprazole (PRILOSEC) 40 MG capsule, Take 1 capsule (40 mg total) by mouth daily. (Patient taking differently: Take 20 mg by mouth daily.), Disp: 90 capsule, Rfl: 3   Allergies  Allergen Reactions   Statins Other (See Comments)    myalgias   Sulfa Antibiotics Itching     Review of Systems  Constitutional: Negative.  Negative for fever.  Respiratory: Negative.    Cardiovascular: Negative.  Negative for chest pain.  Gastrointestinal: Negative.   Musculoskeletal:  Positive for neck pain.  Neurological:  Positive for numbness (posterior left arm - does not like to drive anymore).     Today's Vitals   04/24/22 1617  BP: 128/68  Pulse: 87  Temp: 98.7 F (37.1 C)  TempSrc: Oral  Weight: 156 lb (70.8 kg)  Height: '5\' 5"'$  (1.651 m)   Body mass index is 25.96 kg/m.   Objective:   Physical Exam Vitals reviewed.  Constitutional:      General: She is not in acute distress.    Appearance: Normal appearance.  Cardiovascular:     Rate and Rhythm: Normal rate and regular rhythm.     Pulses: Normal pulses.     Heart sounds: Normal heart sounds. No murmur heard. Pulmonary:     Effort: Pulmonary effort is normal. No respiratory distress.     Breath sounds: Normal breath sounds. No wheezing.  Skin:    Capillary Refill: Capillary refill takes less than 2 seconds.  Neurological:     General: No focal deficit present.     Mental Status: She is alert and oriented to person, place, and time.     Cranial Nerves: No cranial nerve deficit.     Motor: No weakness.  Psychiatric:        Mood and Affect: Mood normal.        Thought Content: Thought content normal.        Judgment: Judgment normal.         Assessment And Plan:     1. Cervicalgia Comments: Radiculopathy related to pinched nerve vs cervalgia strain. Will get xray, apply heat and neck exercises. May need MRI of neck or referral to Orthopedics - DG Cervical Spine Complete; Future - cyclobenzaprine (FLEXERIL) 10 MG tablet; Take 1 tablet (10 mg total) by mouth 3 (three) times daily as needed for muscle spasms.  Dispense: 30 tablet; Refill: 0  2. Need for influenza vaccination Influenza vaccine administered Encouraged to take Tylenol as needed for fever or muscle aches. - Flu Vaccine QUAD 6+ mos PF IM (Fluarix Quad PF)    Patient was given opportunity to ask questions. Patient verbalized understanding of the plan and was able to repeat key elements of the plan. All questions were answered to their satisfaction.  Minette Brine, FNP   I, Minette Brine, FNP, have reviewed all documentation for this visit. The documentation on 04/24/22 for the exam, diagnosis, procedures, and orders are all accurate and complete.   IF YOU HAVE BEEN REFERRED TO A SPECIALIST, IT MAY TAKE 1-2 WEEKS TO SCHEDULE/PROCESS THE REFERRAL. IF  YOU HAVE NOT HEARD FROM US/SPECIALIST IN TWO WEEKS, PLEASE GIVE [name] A CALL AT [phone].   THE PATIENT IS ENCOURAGED TO PRACTICE SOCIAL DISTANCING DUE TO THE COVID-19 PANDEMIC.

## 2022-04-24 NOTE — Patient Instructions (Signed)
Cervical Strain and Sprain Rehab Ask your health care provider which exercises are safe for you. Do exercises exactly as told by your health care provider and adjust them as directed. It is normal to feel mild stretching, pulling, tightness, or discomfort as you do these exercises. Stop right away if you feel sudden pain or your pain gets worse. Do not begin these exercises until told by your health care provider. Stretching and range-of-motion exercises Cervical side bending  Using good posture, sit on a stable chair or stand up. Without moving your shoulders, slowly tilt your left / right ear to your shoulder until you feel a stretch in the opposite side neck muscles. You should be looking straight ahead. Hold for __________ seconds. Repeat with the other side of your neck. Repeat __________ times. Complete this exercise __________ times a day. Cervical rotation  Using good posture, sit on a stable chair or stand up. Slowly turn your head to the side as if you are looking over your left / right shoulder. Keep your eyes level with the ground. Stop when you feel a stretch along the side and the back of your neck. Hold for __________ seconds. Repeat this by turning to your other side. Repeat __________ times. Complete this exercise __________ times a day. Thoracic extension and pectoral stretch  Roll a towel or a small blanket so it is about 4 inches (10 cm) in diameter. Lie down on your back on a firm surface. Put the towel in the middle of your back across your spine. It should not be under your shoulder blades. Put your hands behind your head and let your elbows fall out to your sides. Hold for __________ seconds. Repeat __________ times. Complete this exercise __________ times a day. Strengthening exercises Isometric upper cervical flexion  Lie on your back with a thin pillow behind your head and a small rolled-up towel under your neck. Gently tuck your chin toward your chest and  nod your head down to look toward your feet. Do not lift your head off the pillow. Hold for __________ seconds. Release the tension slowly. Relax your neck muscles completely before you repeat this exercise. Repeat __________ times. Complete this exercise __________ times a day. Isometric cervical extension  Stand about 6 inches (15 cm) away from a wall, with your back facing the wall. Place a soft object, about 6-8 inches (15-20 cm) in diameter, between the back of your head and the wall. A soft object could be a small pillow, a ball, or a folded towel. Gently tilt your head back and press into the soft object. Keep your jaw and forehead relaxed. Hold for __________ seconds. Release the tension slowly. Relax your neck muscles completely before you repeat this exercise. Repeat __________ times. Complete this exercise __________ times a day. Posture and body mechanics Body mechanics refers to the movements and positions of your body while you do your daily activities. Posture is part of body mechanics. Good posture and healthy body mechanics can help to relieve stress in your body's tissues and joints. Good posture means that your spine is in its natural S-curve position (your spine is neutral), your shoulders are pulled back slightly, and your head is not tipped forward. The following are general guidelines for applying improved posture and body mechanics to your everyday activities. Sitting  When sitting, keep your spine neutral and keep your feet flat on the floor. Use a footrest, if necessary, and keep your thighs parallel to the floor. Avoid rounding   your shoulders, and avoid tilting your head forward. When working at a desk or a computer, keep your desk at a height where your hands are slightly lower than your elbows. Slide your chair under your desk so you are close enough to maintain good posture. When working at a computer, place your monitor at a height where you are looking straight ahead  and you do not have to tilt your head forward or downward to look at the screen. Standing  When standing, keep your spine neutral and keep your feet about hip-width apart. Keep a slight bend in your knees. Your ears, shoulders, and hips should line up. When you do a task in which you stand in one place for a long time, place one foot up on a stable object that is 2-4 inches (5-10 cm) high, such as a footstool. This helps keep your spine neutral. Resting When lying down and resting, avoid positions that are most painful for you. Try to support your neck in a neutral position. You can use a contour pillow or a small rolled-up towel. Your pillow should support your neck but not push on it. This information is not intended to replace advice given to you by your health care provider. Make sure you discuss any questions you have with your health care provider. Document Revised: 07/02/2021 Document Reviewed: 07/02/2021 Elsevier Patient Education  2023 Elsevier Inc.  

## 2022-05-02 ENCOUNTER — Ambulatory Visit
Admission: RE | Admit: 2022-05-02 | Discharge: 2022-05-02 | Disposition: A | Discharge status: Home / Self Care | Payer: BC Managed Care – PPO | Source: Ambulatory Visit | Attending: Nurse Practitioner | Admitting: Nurse Practitioner

## 2022-05-02 DIAGNOSIS — M542 Cervicalgia: Secondary | ICD-10-CM

## 2022-05-07 ENCOUNTER — Encounter: Payer: Self-pay | Admitting: Nurse Practitioner

## 2022-05-13 DIAGNOSIS — E7841 Elevated Lipoprotein(a): Secondary | ICD-10-CM | POA: Diagnosis not present

## 2022-05-13 DIAGNOSIS — E7849 Other hyperlipidemia: Secondary | ICD-10-CM | POA: Diagnosis not present

## 2022-05-14 ENCOUNTER — Inpatient Hospital Stay: Admission: RE | Admit: 2022-05-14 | Payer: 59 | Source: Ambulatory Visit

## 2022-05-14 LAB — NMR, LIPOPROFILE
Cholesterol, Total: 223 mg/dL — ABNORMAL HIGH (ref 100–199)
HDL Particle Number: 53.2 umol/L (ref >=30.5)
HDL-C: 100 mg/dL (ref >39)
LDL Particle Number: 1188 nmol/L — ABNORMAL HIGH (ref <1000)
LDL Size: 21.5 nm (ref >20.5)
LDL-C (NIH Calc): 112 mg/dL — ABNORMAL HIGH (ref 0–99)
LP-IR Score: <25 (ref <=45)
Small LDL Particle Number: 277 nmol/L (ref <=527)
Triglycerides: 63 mg/dL (ref 0–149)

## 2022-05-21 ENCOUNTER — Encounter (HOSPITAL_BASED_OUTPATIENT_CLINIC_OR_DEPARTMENT_OTHER): Payer: Self-pay | Admitting: Internal Medicine

## 2022-05-21 ENCOUNTER — Telehealth: Payer: Self-pay

## 2022-05-21 ENCOUNTER — Ambulatory Visit (HOSPITAL_BASED_OUTPATIENT_CLINIC_OR_DEPARTMENT_OTHER): Payer: BC Managed Care – PPO | Admitting: Internal Medicine

## 2022-05-21 VITALS — BP 136/72 | HR 72 | Ht 65.0 in | Wt 161.4 lb

## 2022-05-21 DIAGNOSIS — E7849 Other hyperlipidemia: Secondary | ICD-10-CM

## 2022-05-21 DIAGNOSIS — E7841 Elevated Lipoprotein(a): Secondary | ICD-10-CM

## 2022-05-21 MED ORDER — REPATHA SURECLICK 140 MG/ML ~~LOC~~ SOAJ: 1.0000 | SUBCUTANEOUS | 3 refills | Status: DC

## 2022-05-21 NOTE — Telephone Encounter (Signed)
PA for Repatha SureClick '140MG'$ /ML auto-injectors, submitted and approved by [facility]:  [id number], Effective from 05/21/2022 through 05/20/2023.

## 2022-05-21 NOTE — Progress Notes (Signed)
LIPID CLINIC CONSULT NOTE  Chief Complaint:  Follow-up dyslipidemia  Primary Care Physician: Minette Brine, [place]  Primary Cardiologist:  None  HPI:  Yvonne [name] is a 51 y.o. female who is being seen today for the evaluation of dyslipidemia at the request of Minette Brine, [place].  This is a pleasant 51 year old female kindly referred for evaluation and management of dyslipidemia.  She reports history of elevated cholesterol however more recently her cholesterol has trended upwards.  This is also notable to be associated with weight gain, less activity due to struggles with left knee pain (she has an upcoming repeat knee surgery) and an atherogenic diet.  She reports a family history of heart disease including her maternal grandfather had an MI in his 29s and her mother who had high cholesterol and is of heart care patient.  Also more recently she tried a ketogenic diet.  This seems to correlate with her increasing cholesterol particularly her LDL.  Her most recent lipid profile showed total cholesterol 294, triglycerides 85, HDL 78 and LDL 202.  Hemoglobin A1c was 6.1.  She is not on treatment for diabetes or any medications for that.  11/06/2021  Yvonne [name] returns today for follow-up.  Unfortunately, after changing insurance programs her Repatha was no longer covered.  We then had to consider another option.  Although she had had myalgias with statins before, she had not previously tried atorvastatin and she was willing to try it.  She was then placed on atorvastatin 40 mg daily.  Her lipids now actually do look excellent.  LDL particle #534, LDL-C71, HDL 89, triglycerides 54 and small LDL particle numbers are almost undetectable.  Unfortunately, she did have an elevated LP(a) which will not be reduced with a statin as it was with the PCSK9 inhibitor.  Nonetheless, she seems to be doing well with this therapy  05/21/2022  Yvonne [name] is seen today in follow-up.  Unfortunately she was  previously on Repatha however her insurance did not cover it.  Then she agreed to trying atorvastatin.  Subsequently, however we did note that her LP(a) is significantly elevated at 362.  After switching to atorvastatin 40 mg daily, her cholesterol has been suboptimally controlled.  Total cholesterol now 223, triglycerides 63, HDL 100 and LDL 112.  Her target LDL is less than 70 which she remains well above on high intensity statin therapy.  She will need additional therapy for this and a PCSK9 inhibitor as previously tolerated would be the best option.  PMHx:  Past Medical History:  Diagnosis Date   Allergy    Depression    Diabetes mellitus without complication ([place])    Fibroid 2005   H/O menorrhagia 01/16/2011   Herpes 2009   HPV in female    Hx: UTI (urinary tract infection) 05/06/2011   Hyperlipidemia    Hypertension    PONV (postoperative nausea and vomiting)    Vulvitis 02/04/2011   Yeast infection 06/19/2010    Past Surgical History:  Procedure Laterality Date   ABDOMINAL HYSTERECTOMY     CESAREAN SECTION  2008 & 2009   COLONOSCOPY  11/24/2019   CRYOTHERAPY  2009   CYSTOSCOPY  06/15/2012   Procedure: CYSTOSCOPY;  Surgeon: Delice Lesch, MD;  Location: [facility];  Service: Gynecology;  Laterality: N/A;   KNEE ARTHROSCOPY Left 05/16/2020   Procedure: LEFT KNEE ARTHROSCOPY, PARTIAL MENISCECTOMY;  Surgeon: Melrose Nakayama, MD;  Location: [facility];  Service: Orthopedics;  Laterality: Left;   KNEE ARTHROSCOPY WITH MEDIAL  MENISECTOMY Left 10/13/2020   Procedure: LEFT KNEE ARTHROSCOPY WITH PARTIAL MEDIAL MENISCECTOMY;  Surgeon: Leandrew Koyanagi, MD;  Location: [facility];  Service: Orthopedics;  Laterality: Left;   LAPAROSCOPIC HYSTERECTOMY  06/15/2012   Procedure: HYSTERECTOMY TOTAL LAPAROSCOPIC;  Surgeon: Delice Lesch, MD;  Location: [facility];  Service: Gynecology;  Laterality: N/A;   MYOMECTOMY  2005   TUBAL LIGATION     bilateral    FAMHx:  Family History   Problem Relation Age of Onset   Hypertension Mother    Heart disease Mother    Diabetes Father    Alcohol abuse Father    Diabetes Brother    Hypertension Brother    Alcohol abuse Brother    Heart attack Maternal Grandfather    Stomach cancer Maternal Grandfather    Hypertension Brother    Crohn's disease Other        mat 2nd cousin   Hyperlipidemia Other    Irritable bowel syndrome Other        mat. nephew   Colon cancer Neg Hx    Breast cancer Neg Hx    Colon polyps Neg Hx    Esophageal cancer Neg Hx    Rectal cancer Neg Hx     SOCHx:   reports that she has never smoked. She has never used smokeless tobacco. She reports current alcohol use of about 6.0 standard drinks of alcohol per week. She reports that she does not use drugs.  ALLERGIES:  Allergies  Allergen Reactions   Statins Other (See Comments)    myalgias   Sulfa Antibiotics Itching    ROS: Pertinent items noted in HPI and remainder of comprehensive ROS otherwise negative.  HOME MEDS: Current Outpatient Medications on File Prior to Visit  Medication Sig Dispense Refill   Adapalene 0.3 % gel Apply 1 application topically as needed (zits).      atorvastatin (LIPITOR) 40 MG tablet Take 1 tablet (40 mg total) by mouth daily. 90 tablet 3   Cyanocobalamin (VITAMIN B 12 PO) Take 1 tablet by mouth daily.      cyclobenzaprine (FLEXERIL) 10 MG tablet Take 1 tablet (10 mg total) by mouth 3 (three) times daily as needed for muscle spasms. 30 tablet 0   ipratropium (ATROVENT) 0.03 % nasal spray PLACE 1 SPRAY INTO BOTH NOSTRILS DAILY (Patient taking differently: Place 2 sprays into both nostrils daily.) 30 mL 1   Melatonin 5 MG CHEW Chew by mouth as needed.     Multiple Vitamins-Minerals (CENTRUM ADULTS PO) Take 1 tablet by mouth daily.     Omega-3 Fatty Acids (FISH OIL PO) Take 1 capsule by mouth daily.     omeprazole (PRILOSEC) 40 MG capsule Take 1 capsule (40 mg total) by mouth daily. (Patient taking differently: Take  20 mg by mouth daily.) 90 capsule 3   Semaglutide, 1 MG/DOSE, (OZEMPIC, 1 MG/DOSE,) 4 MG/3ML SOPN Inject 1 mg into the skin once a week. 9 mL 1   valACYclovir (VALTREX) 1000 MG tablet TAKE 1 TABLET BY MOUTH EVERY 12 HOURS 180 tablet 1   No current facility-administered medications on file prior to visit.    LABS/IMAGING: No results found for this or any previous visit (from the past 48 hour(s)). No results found.  LIPID PANEL:    Component Value Date/Time   CHOL 272 (H) 02/02/2021 0958   TRIG 55 02/02/2021 0958   HDL 73 02/02/2021 0958   CHOLHDL 3.7 02/02/2021 0958   LDLCALC 191 (H) 02/02/2021  0958    WEIGHTS: Wt Readings from Last 3 Encounters:  05/21/22 161 lb 6.4 oz (73.2 kg)  04/24/22 156 lb (70.8 kg)  12/12/21 153 lb (69.4 kg)    VITALS: BP 136/72   Pulse 72   Ht '5\' 5"'$  (1.651 m)   Wt 161 lb 6.4 oz (73.2 kg)   LMP 05/25/2012   SpO2 99%   BMI 26.86 kg/m   EXAM: Deferred  EKG: Deferred  ASSESSMENT: Dyslipidemia with LDL greater than 190, suggestive of dyslipidemia Family history of coronary disease Zero coronary artery calcium (09/2020) Subcentimeter right middle lobe pulmonary nodule (09/2020) High LP(a)-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   Yvonne [name] unfortunately had to stop her PCSK9 inhibitor after changing insurance companies.  She however has been tolerating high-dose statin, atorvastatin 40 mg daily, however labs recently still show her LDL not at target less than 70.  She will need additional therapy which is exactly the patient that garnered approval for PCSK9 inhibitors in 2015.  Would recommend retrial prior authorization for PCSK9 inhibitor therapy in addition to her statin.  Plan follow-up with me in 3 to 4 months with an NMR and LP(a).  Pixie Casino, MD, [facility], [place] Director of the Advanced Lipid Disorders &  Cardiovascular Risk Reduction Clinic Diplomate of the American Board of Clinical Lipidology Attending  Cardiologist  Direct Dial: [phone]  Fax: [phone]  Website:  [name] 05/21/2022, 1:44 PM

## 2022-05-21 NOTE — Patient Instructions (Signed)
Medication Instructions:  Dr. Debara Pickett recommends Repatha Sureclick '140mg'$ /mL (PCSK9). This is an injectable cholesterol medication self-administered once every 14 days. This medication will likely need prior approval with your insurance company, which we will work on. If the medication is not approved initially, we may need to do an appeal with your insurance.   Administer medication in area of fatty tissue such as abdomen, outer thigh, back of upper arm - and rotate site with each injection Store medication in refrigerator until ready to administer - allow to sit at room temp for 30 mins - 1 hour prior to injection Dispose of medication in a SHARPS container - your pharmacy should be able to direct you on this and proper disposal   If you need a co-pay card for Repatha: MicroLists.[email or web address] If you need a co-pay card for Praluent: [email or web address]  Patient Assistance:    These foundations have funds at various times.   The PAN Foundation: https://www.panfoundation.org/disease-funds/hypercholesterolemia/ -- can sign up for wait list  [facility] offers assistance to help pay for medication copays.  They will cover copays for all cholesterol lowering meds, including statins, fibrates, omega-3 fish oils like Vascepa, ezetimibe, Repatha, Praluent, Nexletol, Nexlizet.  The cards are usually good for $2,500 or 12 months, whichever comes first. Go to healthwellfoundation.org Click on "Apply Now" Answer questions as to whom is applying (patient or representative) Your disease fund will be "hypercholesterolemia - Medicare access" They will ask questions about finances and which medications you are taking for cholesterol When you submit, the approval is usually within minutes.  You will need to print the card information from the site You will need to show this information to your pharmacy, they will bill your Medicare Part D plan first -then bill  Health Well --for the copay.   You can also call them at [phone], although the hold times can be quite long.     *If you need a refill on your cardiac medications before your next appointment, please call your pharmacy*   Lab Work: FASTING lab work to check cholesterol in 3-4 months  If you have labs (blood work) drawn today and your tests are completely normal, you will receive your results only by: [place] (if you have MyChart) OR A paper copy in the mail If you have any lab test that is abnormal or we need to change your treatment, we will call you to review the results.   Follow-Up: At [facility], you and your health needs are our priority.  As part of our continuing mission to provide you with exceptional heart care, we have created designated Provider Care Teams.  These Care Teams include your primary Cardiologist (physician) and Advanced Practice Providers (APPs -  Physician Assistants and Nurse Practitioners) who all work together to provide you with the care you need, when you need it.  We recommend signing up for the patient portal called "MyChart".  Sign up information is provided on this After Visit Summary.  MyChart is used to connect with patients for Virtual Visits (Telemedicine).  Patients are able to view lab/test results, encounter notes, upcoming appointments, etc.  Non-urgent messages can be sent to your provider as well.   To learn more about what you can do with MyChart, go to [email or web address].    Your next appointment:    3-4 months with Dr. Debara Pickett

## 2022-05-26 ENCOUNTER — Ambulatory Visit (HOSPITAL_BASED_OUTPATIENT_CLINIC_OR_DEPARTMENT_OTHER): Payer: BC Managed Care – PPO

## 2022-05-28 ENCOUNTER — Other Ambulatory Visit: Payer: Self-pay | Admitting: Internal Medicine

## 2022-05-28 DIAGNOSIS — R911 Solitary pulmonary nodule: Secondary | ICD-10-CM

## 2022-06-13 ENCOUNTER — Encounter: Payer: BC Managed Care – PPO | Admitting: Nurse Practitioner

## 2022-06-18 ENCOUNTER — Ambulatory Visit (INDEPENDENT_AMBULATORY_CARE_PROVIDER_SITE_OTHER): Payer: BC Managed Care – PPO | Admitting: Nurse Practitioner

## 2022-06-18 VITALS — BP 122/88 | HR 63 | Temp 98.2°F | Ht 65.0 in | Wt 163.2 lb

## 2022-06-18 DIAGNOSIS — Z6825 Body mass index (BMI) 25.0-25.9, adult: Secondary | ICD-10-CM

## 2022-06-18 DIAGNOSIS — Z0000 Encounter for general adult medical examination without abnormal findings: Secondary | ICD-10-CM

## 2022-06-18 DIAGNOSIS — E782 Mixed hyperlipidemia: Secondary | ICD-10-CM | POA: Diagnosis not present

## 2022-06-18 DIAGNOSIS — H6123 Impacted cerumen, bilateral: Secondary | ICD-10-CM

## 2022-06-18 DIAGNOSIS — R7303 Prediabetes: Secondary | ICD-10-CM

## 2022-06-18 DIAGNOSIS — M542 Cervicalgia: Secondary | ICD-10-CM

## 2022-06-18 NOTE — Patient Instructions (Signed)

## 2022-06-18 NOTE — Progress Notes (Signed)
Barnet Glasgow Martin,acting as a Education administrator for Minette Brine, FNP.,have documented all relevant documentation on the behalf of Minette Brine, FNP,as directed by  Minette Brine, FNP while in the presence of Minette Brine, [place].   Subjective:     Patient ID: Yvonne [name] , female    DOB: [birth year] , 51 y.o.   MRN: [id number]   Chief Complaint  Patient presents with   Annual Exam    HPI  Patient is here for full physical exam. She reports compliance with medications.  She is to have left knee surgery which the pain has limited her from exercising regularly  BP Readings from Last 3 Encounters: 06/18/22 : 122/88 05/21/22 : 136/72 04/24/22 : 128/68       Past Medical History:  Diagnosis Date   Allergy    Depression    Diabetes mellitus without complication ([place])    Fibroid 2005   H/O menorrhagia 01/16/2011   Herpes 2009   HPV in female    Hx: UTI (urinary tract infection) 05/06/2011   Hyperlipidemia    Hypertension    PONV (postoperative nausea and vomiting)    Vulvitis 02/04/2011   Yeast infection 06/19/2010     Family History  Problem Relation Age of Onset   Hypertension Mother    Heart disease Mother    Diabetes Father    Alcohol abuse Father    Diabetes Brother    Hypertension Brother    Alcohol abuse Brother    Heart attack Maternal Grandfather    Stomach cancer Maternal Grandfather    Hypertension Brother    Crohn's disease Other        mat 2nd cousin   Hyperlipidemia Other    Irritable bowel syndrome Other        mat. nephew   Colon cancer Neg Hx    Breast cancer Neg Hx    Colon polyps Neg Hx    Esophageal cancer Neg Hx    Rectal cancer Neg Hx      Current Outpatient Medications:    Adapalene 0.3 % gel, Apply 1 application topically as needed (zits). , Disp: , Rfl:    atorvastatin (LIPITOR) 40 MG tablet, Take 1 tablet (40 mg total) by mouth daily., Disp: 90 tablet, Rfl: 3   Cyanocobalamin (VITAMIN B 12 PO), Take 1 tablet by mouth daily. , Disp: , Rfl:     cyclobenzaprine (FLEXERIL) 10 MG tablet, Take 1 tablet (10 mg total) by mouth 3 (three) times daily as needed for muscle spasms., Disp: 30 tablet, Rfl: 0   Evolocumab (REPATHA SURECLICK) 841 MG/ML SOAJ, Inject 1 Dose into the skin every 14 (fourteen) days., Disp: 6 mL, Rfl: 3   Melatonin 5 MG CHEW, Chew by mouth as needed., Disp: , Rfl:    Multiple Vitamins-Minerals (CENTRUM ADULTS PO), Take 1 tablet by mouth daily., Disp: , Rfl:    Omega-3 Fatty Acids (FISH OIL PO), Take 1 capsule by mouth daily., Disp: , Rfl:    omeprazole (PRILOSEC) 40 MG capsule, Take 1 capsule (40 mg total) by mouth daily. (Patient taking differently: Take 20 mg by mouth daily.), Disp: 90 capsule, Rfl: 3   Semaglutide, 1 MG/DOSE, (OZEMPIC, 1 MG/DOSE,) 4 MG/3ML SOPN, Inject 1 mg into the skin once a week., Disp: 9 mL, Rfl: 1   valACYclovir (VALTREX) 1000 MG tablet, TAKE 1 TABLET BY MOUTH EVERY 12 HOURS, Disp: 180 tablet, Rfl: 1   ipratropium (ATROVENT) 0.03 % nasal spray, PLACE 1 SPRAY INTO BOTH NOSTRILS DAILY (Patient  not taking: Reported on 06/18/2022), Disp: 30 mL, Rfl: 1   Allergies  Allergen Reactions   Statins Other (See Comments)    myalgias   Sulfa Antibiotics Itching      The patient states she is status post hysterectomy.  Patient's last menstrual period was 05/25/2012.. Negative for Dysmenorrhea and Negative for Menorrhagia. Negative for: breast discharge, breast lump(s), breast pain and breast self exam. Associated symptoms include abnormal vaginal bleeding. Pertinent negatives include abnormal bleeding (hematology), anxiety, decreased libido, depression, difficulty falling sleep, dyspareunia, history of infertility, nocturia, sexual dysfunction, sleep disturbances, urinary incontinence, urinary urgency, vaginal discharge and vaginal itching. Diet regular.The patient states her exercise level is minimal continues to have knee pain and is planning to have her surgery at the beginning of next year   . The patient's  tobacco use is:  Social History   Tobacco Use  Smoking Status Never  Smokeless Tobacco Never  She has been exposed to passive smoke. The patient's alcohol use is:  Social History   Substance and Sexual Activity  Alcohol Use Yes   Alcohol/week: 6.0 standard drinks of alcohol   Types: 6 Glasses of wine per week   Comment: wine daily     Review of Systems  Constitutional: Negative.   HENT: Negative.    Eyes: Negative.   Respiratory: Negative.    Cardiovascular: Negative.   Gastrointestinal: Negative.   Endocrine: Negative.   Genitourinary: Negative.   Musculoskeletal: Negative.   Skin: Negative.   Allergic/Immunologic: Negative.   Neurological: Negative.   Hematological: Negative.   Psychiatric/Behavioral: Negative.       Today's Vitals   06/18/22 1104  BP: 122/88  Pulse: 63  Temp: 98.2 F (36.8 C)  TempSrc: Oral  Weight: 163 lb 3.2 oz (74 kg)  Height: 5' 5"  (1.651 m)  PainSc: 0-No pain   Body mass index is 27.16 kg/m.  Wt Readings from Last 3 Encounters:  06/18/22 163 lb 3.2 oz (74 kg)  05/21/22 161 lb 6.4 oz (73.2 kg)  04/24/22 156 lb (70.8 kg)    Objective:  Physical Exam Vitals reviewed.  Constitutional:      General: She is not in acute distress.    Appearance: Normal appearance. She is well-developed.  HENT:     Head: Normocephalic and atraumatic.     Right Ear: Hearing and external ear normal. There is impacted cerumen.     Left Ear: Hearing and external ear normal. There is impacted cerumen.     Nose: Nose normal.     Mouth/Throat:     Mouth: Mucous membranes are moist.  Eyes:     General: Lids are normal.     Extraocular Movements: Extraocular movements intact.     Conjunctiva/sclera: Conjunctivae normal.     Pupils: Pupils are equal, round, and reactive to light.     Funduscopic exam:    Right eye: No papilledema.        Left eye: No papilledema.  Neck:     Thyroid: No thyroid mass.     Vascular: No carotid bruit.  Cardiovascular:      Rate and Rhythm: Normal rate and regular rhythm.     Pulses: Normal pulses.     Heart sounds: Normal heart sounds. No murmur heard. Pulmonary:     Effort: Pulmonary effort is normal.     Breath sounds: Normal breath sounds.  Abdominal:     General: Abdomen is flat. Bowel sounds are normal. There is no distension.  Palpations: Abdomen is soft.     Tenderness: There is no abdominal tenderness.  Musculoskeletal:        General: No swelling or tenderness. Normal range of motion.     Cervical back: Full passive range of motion without pain, normal range of motion and neck supple.     Right lower leg: No edema.     Left lower leg: No edema.  Skin:    General: Skin is warm and dry.     Capillary Refill: Capillary refill takes less than 2 seconds.  Neurological:     General: No focal deficit present.     Mental Status: She is alert and oriented to person, place, and time.     Cranial Nerves: No cranial nerve deficit.     Sensory: No sensory deficit.  Psychiatric:        Mood and Affect: Mood normal.        Behavior: Behavior normal.        Thought Content: Thought content normal.        Judgment: Judgment normal.         Assessment And Plan:     1. Health maintenance examination Behavior modifications discussed and diet history reviewed.   Pt will continue to exercise regularly and modify diet with low GI, plant based foods and decrease intake of processed foods.  Recommend intake of daily multivitamin, Vitamin D, and calcium.  Recommend mammogram and colonoscopy for preventive screenings, as well as recommend immunizations that include influenza, TDAP  2. Cervicalgia Comments: Continue with neck exercises and applying heating pad  3. Prediabetes Comments: Stable, continue current medications. - CBC no Diff - CMP14+EGFR - Hemoglobin A1c  4. Mixed hyperlipidemia Comments: Continue current medications and f/u with Cardiology - Lipid panel  5. BMI 25.0-25.9,adult  6.  Excessive cerumen in both ear canals Comments: right had skin mostly and left had cerumen removed with lighted curette     Patient was given opportunity to ask questions. Patient verbalized understanding of the plan and was able to repeat key elements of the plan. All questions were answered to their satisfaction.   Minette Brine, FNP   I, Minette Brine, FNP, have reviewed all documentation for this visit. The documentation on 06/18/22 for the exam, diagnosis, procedures, and orders are all accurate and complete.   THE PATIENT IS ENCOURAGED TO PRACTICE SOCIAL DISTANCING DUE TO THE COVID-19 PANDEMIC.

## 2022-06-19 LAB — CMP14+EGFR
ALT: 22 IU/L (ref 0–32)
AST: 22 IU/L (ref 0–40)
Albumin/Globulin Ratio: 2.4 — ABNORMAL HIGH (ref 1.2–2.2)
Albumin: 4.8 g/dL (ref 3.8–4.9)
Alkaline Phosphatase: 76 IU/L (ref 44–121)
BUN/Creatinine Ratio: 15 (ref 9–23)
BUN: 10 mg/dL (ref 6–24)
Bilirubin Total: 0.5 mg/dL (ref 0.0–1.2)
CO2: 25 mmol/L (ref 20–29)
Calcium: 9.4 mg/dL (ref 8.7–10.2)
Chloride: 104 mmol/L (ref 96–106)
Creatinine, Ser: 0.66 mg/dL (ref 0.57–1.00)
Globulin, Total: 2 g/dL (ref 1.5–4.5)
Glucose: 89 mg/dL (ref 70–99)
Potassium: 4.6 mmol/L (ref 3.5–5.2)
Sodium: 143 mmol/L (ref 134–144)
Total Protein: 6.8 g/dL (ref 6.0–8.5)
eGFR: 105 mL/min/{1.73_m2} (ref >59)

## 2022-06-19 LAB — LIPID PANEL
Chol/HDL Ratio: 1.9 ratio (ref 0.0–4.4)
Cholesterol, Total: 156 mg/dL (ref 100–199)
HDL: 83 mg/dL (ref >39)
LDL Chol Calc (NIH): 60 mg/dL (ref 0–99)
Triglycerides: 65 mg/dL (ref 0–149)
VLDL Cholesterol Cal: 13 mg/dL (ref 5–40)

## 2022-06-19 LAB — CBC
Hematocrit: 40.5 % (ref 34.0–46.6)
Hemoglobin: 13.9 g/dL (ref 11.1–15.9)
MCH: 30.4 pg (ref 26.6–33.0)
MCHC: 34.3 g/dL (ref 31.5–35.7)
MCV: 89 fL (ref 79–97)
Platelets: 283 10*3/uL (ref 150–450)
RBC: 4.57 x10E6/uL (ref 3.77–5.28)
RDW: 12.2 % (ref 11.7–15.4)
WBC: 3.2 10*3/uL — ABNORMAL LOW (ref 3.4–10.8)

## 2022-06-19 LAB — HEMOGLOBIN A1C
Est. average glucose Bld gHb Est-mCnc: 114 mg/dL
Hgb A1c MFr Bld: 5.6 % (ref 4.8–5.6)

## 2022-06-25 DIAGNOSIS — H16223 Keratoconjunctivitis sicca, not specified as Sjogren's, bilateral: Secondary | ICD-10-CM | POA: Diagnosis not present

## 2022-06-30 ENCOUNTER — Encounter: Payer: Self-pay | Admitting: Nurse Practitioner

## 2022-07-04 ENCOUNTER — Encounter: Payer: Self-pay | Admitting: Nurse Practitioner

## 2022-07-21 ENCOUNTER — Encounter: Payer: Self-pay | Admitting: Nurse Practitioner

## 2022-07-21 ENCOUNTER — Other Ambulatory Visit: Payer: Self-pay | Admitting: Nurse Practitioner

## 2022-07-21 DIAGNOSIS — R7303 Prediabetes: Secondary | ICD-10-CM

## 2022-07-22 ENCOUNTER — Encounter: Payer: Self-pay | Admitting: Nurse Practitioner

## 2022-07-22 ENCOUNTER — Other Ambulatory Visit: Payer: Self-pay | Admitting: Nurse Practitioner

## 2022-07-22 DIAGNOSIS — R232 Flushing: Secondary | ICD-10-CM

## 2022-07-22 MED ORDER — PAROXETINE HCL 10 MG PO TABS: 10.0000 mg | ORAL_TABLET | Freq: Every day | ORAL | 1 refills | Status: DC

## 2022-07-29 ENCOUNTER — Other Ambulatory Visit (HOSPITAL_BASED_OUTPATIENT_CLINIC_OR_DEPARTMENT_OTHER): Payer: Self-pay | Admitting: Nurse Practitioner

## 2022-07-29 DIAGNOSIS — Z1239 Encounter for other screening for malignant neoplasm of breast: Secondary | ICD-10-CM

## 2022-07-31 ENCOUNTER — Encounter: Payer: Self-pay | Admitting: Nurse Practitioner

## 2022-08-05 ENCOUNTER — Other Ambulatory Visit: Payer: Self-pay

## 2022-08-05 DIAGNOSIS — R7303 Prediabetes: Secondary | ICD-10-CM

## 2022-08-05 MED ORDER — OZEMPIC (1 MG/DOSE) 4 MG/3ML ~~LOC~~ SOPN: PEN_INJECTOR | SUBCUTANEOUS | 1 refills | Status: DC

## 2022-08-14 ENCOUNTER — Ambulatory Visit (INDEPENDENT_AMBULATORY_CARE_PROVIDER_SITE_OTHER): Payer: BC Managed Care – PPO | Admitting: Orthopaedic Surgery

## 2022-08-14 DIAGNOSIS — M1712 Unilateral primary osteoarthritis, left knee: Secondary | ICD-10-CM | POA: Diagnosis not present

## 2022-08-14 NOTE — Progress Notes (Signed)
Chief Complaint: left knee pain     History of Present Illness:    Yvonne [name] is a 51 y.o. female presents today with ongoing left knee pain that has been going for several years.  She denied any specific injury.  She did undergo arthroscopic debridement 2022 initially with Dr. Rhona Raider and then subsequently with Dr. Erlinda Hong.  Following her second debridement and microfracture an injection was performed as she was having persistent swelling about the knee.  She is here today as she is having persistent knee pain and swelling.  She experiences posterior tightness.  She does enjoy staying active although this is quite limited as result of the knee.  She is here today with her mother    Surgical History:   none  PMH/PSH/Family History/Social History/Meds/Allergies:    Past Medical History:  Diagnosis Date   Allergy    Depression    Diabetes mellitus without complication ([place])    Fibroid 2005   H/O menorrhagia 01/16/2011   Herpes 2009   HPV in female    Hx: UTI (urinary tract infection) 05/06/2011   Hyperlipidemia    Hypertension    PONV (postoperative nausea and vomiting)    Vulvitis 02/04/2011   Yeast infection 06/19/2010   Past Surgical History:  Procedure Laterality Date   ABDOMINAL HYSTERECTOMY     CESAREAN SECTION  2008 & 2009   COLONOSCOPY  11/24/2019   CRYOTHERAPY  2009   CYSTOSCOPY  06/15/2012   Procedure: CYSTOSCOPY;  Surgeon: Delice Lesch, MD;  Location: [facility];  Service: Gynecology;  Laterality: N/A;   KNEE ARTHROSCOPY Left 05/16/2020   Procedure: LEFT KNEE ARTHROSCOPY, PARTIAL MENISCECTOMY;  Surgeon: Melrose Nakayama, MD;  Location: [facility];  Service: Orthopedics;  Laterality: Left;   KNEE ARTHROSCOPY WITH MEDIAL MENISECTOMY Left 10/13/2020   Procedure: LEFT KNEE ARTHROSCOPY WITH PARTIAL MEDIAL MENISCECTOMY;  Surgeon: Leandrew Koyanagi, MD;  Location: [facility];  Service: Orthopedics;  Laterality: Left;    LAPAROSCOPIC HYSTERECTOMY  06/15/2012   Procedure: HYSTERECTOMY TOTAL LAPAROSCOPIC;  Surgeon: Delice Lesch, MD;  Location: [facility];  Service: Gynecology;  Laterality: N/A;   MYOMECTOMY  2005   TUBAL LIGATION     bilateral   Social History   Socioeconomic History   Marital status: Divorced    Spouse name: Not on file   Number of children: 2   Years of education: Not on file   Highest education level: Not on file  Occupational History   Occupation: homemaker    Employer: [employer]  Tobacco Use   Smoking status: Never   Smokeless tobacco: Never  Vaping Use   Vaping Use: Never used  Substance and Sexual Activity   Alcohol use: Yes    Alcohol/week: 6.0 standard drinks of alcohol    Types: 6 Glasses of wine per week    Comment: wine daily   Drug use: No   Sexual activity: Yes    Birth control/protection: Surgical    Comment: BTL  Other Topics Concern   Not on file  Social History Narrative   Not on file   Social Determinants of Health   Financial Resource Strain: Not on file  Food Insecurity: No Food Insecurity (10/19/2020)   Hunger Vital Sign    Worried About Running Out of Food in the  Last Year: Never true    Ran Out of Food in the Last Year: Never true  Transportation Needs: Not on file  Physical Activity: Not on file  Stress: Not on file  Social Connections: Not on file   Family History  Problem Relation Age of Onset   Hypertension Mother    Heart disease Mother    Diabetes Father    Alcohol abuse Father    Diabetes Brother    Hypertension Brother    Alcohol abuse Brother    Heart attack Maternal Grandfather    Stomach cancer Maternal Grandfather    Hypertension Brother    Crohn's disease Other        mat 2nd cousin   Hyperlipidemia Other    Irritable bowel syndrome Other        mat. nephew   Colon cancer Neg Hx    Breast cancer Neg Hx    Colon polyps Neg Hx    Esophageal cancer Neg Hx    Rectal cancer Neg Hx    Allergies  Allergen  Reactions   Statins Other (See Comments)    myalgias   Sulfa Antibiotics Itching   Current Outpatient Medications  Medication Sig Dispense Refill   PARoxetine (PAXIL) 10 MG tablet Take 1 tablet (10 mg total) by mouth daily. 90 tablet 1   Adapalene 0.3 % gel Apply 1 application topically as needed (zits).      atorvastatin (LIPITOR) 40 MG tablet Take 1 tablet (40 mg total) by mouth daily. 90 tablet 3   Cyanocobalamin (VITAMIN B 12 PO) Take 1 tablet by mouth daily.      cyclobenzaprine (FLEXERIL) 10 MG tablet Take 1 tablet (10 mg total) by mouth 3 (three) times daily as needed for muscle spasms. 30 tablet 0   Evolocumab (REPATHA SURECLICK) 376 MG/ML SOAJ Inject 1 Dose into the skin every 14 (fourteen) days. 6 mL 3   ipratropium (ATROVENT) 0.03 % nasal spray PLACE 1 SPRAY INTO BOTH NOSTRILS DAILY (Patient not taking: Reported on 06/18/2022) 30 mL 1   Melatonin 5 MG CHEW Chew by mouth as needed.     Multiple Vitamins-Minerals (CENTRUM ADULTS PO) Take 1 tablet by mouth daily.     Omega-3 Fatty Acids (FISH OIL PO) Take 1 capsule by mouth daily.     omeprazole (PRILOSEC) 40 MG capsule Take 1 capsule (40 mg total) by mouth daily. (Patient taking differently: Take 20 mg by mouth daily.) 90 capsule 3   Semaglutide, 1 MG/DOSE, (OZEMPIC, 1 MG/DOSE,) 4 MG/3ML SOPN INJECT '1MG'$  INTO THE SKIN ONCE A WEEK 9 mL 1   valACYclovir (VALTREX) 1000 MG tablet TAKE 1 TABLET BY MOUTH EVERY 12 HOURS 180 tablet 1   No current facility-administered medications for this visit.   No results found.  Review of Systems:   A ROS was performed including pertinent positives and negatives as documented in the HPI.  Physical Exam :   Constitutional: NAD and appears stated age Neurological: Alert and oriented Psych: Appropriate affect and cooperative Last menstrual period 05/25/2012.   Comprehensive Musculoskeletal Exam:      Musculoskeletal Exam  Gait Normal  Alignment Normal   Right Left  Inspection Normal Normal   Palpation    Tenderness None Medial tibiofemoral  Crepitus None None  Effusion none none  Range of Motion    Extension -3 -3  Flexion 135 135  Strength    Extension 5/5 5/5  Flexion 5/5 5/5  Ligament Exam     Generalized  Laxity No No  Lachman Negative Negative   Pivot Shift Negative Negative  Anterior Drawer Negative Negative  Valgus at 0 Negative Negative  Valgus at 20 Negative Negative  Varus at 0 0 0  Varus at 20   0 0  Posterior Drawer at 90 0 0  Vascular/Lymphatic Exam    Edema None None  Venous Stasis Changes No No  Distal Circulation Normal Normal  Neurologic    Light Touch Sensation Intact Intact  Special Tests:      Imaging:   Xray (4 views left knee): Mild medial joint space narrowing  I personally reviewed and interpreted the radiographs.   Assessment:   51 y.o. female with left knee pain consistent with early osteoarthritis following 2 meniscal debridements as well as microfracture.  At today's visit I described that her joint space narrowing is somewhat mild.  She was previously recommended partial knee arthroplasty although she does not necessarily believe that she is ready for this.  Side effect I do believe that additional nonoperative treatments may be tried.  Given the fact that she has had previous steroid injections I do believe that she would be a candidate for platelet rich plasma.  She would like to consider this.  We also plan to begin her with physical therapy here at the [facility] so that we can specifically have her work on aquatic therapy for left knee strengthening.    Plan :    -Plan for referral to Dr. Elba Barman for left knee PRP      I personally saw and evaluated the patient, and participated in the management and treatment plan.  Vanetta Mulders, MD Attending Physician, Orthopedic Surgery  This document was dictated using Dragon voice recognition software. A reasonable attempt at proof reading has been made to  minimize errors.

## 2022-08-15 ENCOUNTER — Ambulatory Visit: Payer: BC Managed Care – PPO | Admitting: Sports Medicine

## 2022-08-15 ENCOUNTER — Encounter: Payer: Self-pay | Admitting: Sports Medicine

## 2022-08-15 DIAGNOSIS — M25562 Pain in left knee: Secondary | ICD-10-CM

## 2022-08-15 DIAGNOSIS — G8929 Other chronic pain: Secondary | ICD-10-CM

## 2022-08-15 DIAGNOSIS — M1712 Unilateral primary osteoarthritis, left knee: Secondary | ICD-10-CM

## 2022-08-15 NOTE — Progress Notes (Signed)
Mckaylee Dimalanta - 51 y.o. female MRN [id number]  Date of birth: [birth year]  Office Visit Note: Visit Date: 08/15/2022 PCP: Minette Brine, FNP Referred by: Vanetta Mulders, MD  Subjective: Chief Complaint  Patient presents with   Left Knee - Pain   HPI: Latrina Guttman is a pleasant 51 y.o. female who presents today for evaluation of chronic left knee pain.  Yasuko has had ongoing left knee pain for 3 years, she did undergo arthroscopic debridement of the meniscus in 2022 initially with Dr. Rhona Raider, then subsequently with Dr. Erlinda Hong.  During her second procedure she did have microfracture as well.  She states the second procedure with Dr. Erlinda Hong did Improve her pain significantly, however it returned after few months.  This has been significantly affecting her quality of life that she has been unable to get back into exercise and physical activity with her husband pain and recurrent swelling about the knee.  Pertinent ROS were reviewed with the patient and found to be negative unless otherwise specified above in HPI.   Assessment & Plan: Visit Diagnoses:  1. Unilateral primary osteoarthritis, left knee   2. Chronic pain of left knee    Plan: I did review the nature of her knee pain with Shameka today and discussed treatment options.  She was most interested in PRP injection after seeing my partner, Dr. Sammuel Hines.  I did discuss the nature of how therapy works as well as preinjection and postinjection protocol.  She would like to proceed with PRP injection therapy, we will get her scheduled for this at her desire.  I did discuss with her that she needs to stop all anti-inflammatories for 10 days prior to her injection.  Her exam does suggest some valgus stress and horizontal shimmy, did discuss the nature of her work and medial unloader brace as well.  She does have a brace at home that she will bring to her next appointment.  If this is not a medial unloader brace, I may give her our brace  representative to discuss a possible unloader brace as well that I think may help provide benefit for her.  Follow-up: F/u for left knee PRP injection    Meds & Orders: No orders of the defined types were placed in this encounter.  No orders of the defined types were placed in this encounter.    Procedures: No procedures performed      Clinical History: No specialty comments available.  She reports that she has never smoked. She has never used smokeless tobacco.  Recent Labs    08/28/21 1011 12/12/21 1225 06/18/22 1150  HGBA1C 5.5 5.6 5.6    Objective:   Vital Signs: LMP 05/25/2012   Physical Exam  Gen: Well-appearing, in no acute distress; non-toxic CV: Regular Rate. Well-perfused. Warm.  Resp: Breathing unlabored on room air; no wheezing. Psych: Fluid speech in conversation; appropriate affect; normal thought process Neuro: Sensation intact throughout. No gross coordination deficits.   Ortho Exam - Left knee: There is a small joint effusion noted of the left knee in the suprapatellar pouch without redness or erythema.  Range of motion 0-125 degrees.  Ligamentously intact.  There is some mild quadricep weakness, although no gross atrophy.  Neurovascular intact distally.  Imaging:  Narrative & Impression  CLINICAL DATA:  Left knee pain and swelling.   EXAM: LEFT KNEE - COMPLETE 4+ VIEW   COMPARISON:  None.   FINDINGS: There is no acute displaced fracture or dislocation. The joint spaces are relatively  well preserved. There is a small joint effusion.   IMPRESSION: 1. No acute displaced fracture or dislocation. 2. Small suprapatellar joint effusion. 3. The joint spaces are relatively well preserved.     Electronically Signed   By: Constance Holster M.D.   On: 01/28/2019 21:14    Past Medical/Family/Surgical/Social History: Medications & Allergies reviewed per EMR, new medications updated. Patient Active Problem List   Diagnosis Date Noted   Prediabetes  12/12/2021   Familial hyperlipidemia 02/06/2021   Chondromalacia, left knee 10/13/2020   Synovitis of knee 10/13/2020   Acute medial meniscal tear, left, initial encounter 09/22/2020   Constipation 06/21/2019   Mixed hyperlipidemia 12/15/2018   Depression 12/15/2018   S/P laparoscopic hysterectomy - 06/15/12 06/23/2012   Past Medical History:  Diagnosis Date   Allergy    Depression    Diabetes mellitus without complication ([place])    Fibroid 2005   H/O menorrhagia 01/16/2011   Herpes 2009   HPV in female    Hx: UTI (urinary tract infection) 05/06/2011   Hyperlipidemia    Hypertension    PONV (postoperative nausea and vomiting)    Vulvitis 02/04/2011   Yeast infection 06/19/2010   Family History  Problem Relation Age of Onset   Hypertension Mother    Heart disease Mother    Diabetes Father    Alcohol abuse Father    Diabetes Brother    Hypertension Brother    Alcohol abuse Brother    Heart attack Maternal Grandfather    Stomach cancer Maternal Grandfather    Hypertension Brother    Crohn's disease Other        mat 2nd cousin   Hyperlipidemia Other    Irritable bowel syndrome Other        mat. nephew   Colon cancer Neg Hx    Breast cancer Neg Hx    Colon polyps Neg Hx    Esophageal cancer Neg Hx    Rectal cancer Neg Hx    Past Surgical History:  Procedure Laterality Date   ABDOMINAL HYSTERECTOMY     CESAREAN SECTION  2008 & 2009   COLONOSCOPY  11/24/2019   CRYOTHERAPY  2009   CYSTOSCOPY  06/15/2012   Procedure: CYSTOSCOPY;  Surgeon: Delice Lesch, MD;  Location: [facility];  Service: Gynecology;  Laterality: N/A;   KNEE ARTHROSCOPY Left 05/16/2020   Procedure: LEFT KNEE ARTHROSCOPY, PARTIAL MENISCECTOMY;  Surgeon: Melrose Nakayama, MD;  Location: [facility];  Service: Orthopedics;  Laterality: Left;   KNEE ARTHROSCOPY WITH MEDIAL MENISECTOMY Left 10/13/2020   Procedure: LEFT KNEE ARTHROSCOPY WITH PARTIAL MEDIAL MENISCECTOMY;  Surgeon: Leandrew Koyanagi, MD;  Location:  [facility];  Service: Orthopedics;  Laterality: Left;   LAPAROSCOPIC HYSTERECTOMY  06/15/2012   Procedure: HYSTERECTOMY TOTAL LAPAROSCOPIC;  Surgeon: Delice Lesch, MD;  Location: [facility];  Service: Gynecology;  Laterality: N/A;   MYOMECTOMY  2005   TUBAL LIGATION     bilateral   Social History   Occupational History   Occupation: homemaker    Employer: [employer]  Tobacco Use   Smoking status: Never   Smokeless tobacco: Never  Vaping Use   Vaping Use: Never used  Substance and Sexual Activity   Alcohol use: Yes    Alcohol/week: 6.0 standard drinks of alcohol    Types: 6 Glasses of wine per week    Comment: wine daily   Drug use: No   Sexual activity: Yes    Birth control/protection: Surgical  Comment: BTL

## 2022-08-15 NOTE — Patient Instructions (Addendum)
Dr. Rolena Infante' Instructions and What to Expect for PRP Injections:  Platelet-rich plasma is used in musculoskeletal medicine to focus your own body's ability to heal. It has several well-done published randomized control trials (RCT) which demonstrate both its effectiveness and safety in many musculoskeletal conditions, including osteoarthritis, tendinopathies, and damaged vertebral discs. PRP has been in clinical use since the 1990's. Many people know that platelets form a clot if there is a cut in the skin. It turns out that platelets do not only form a clot, they also start the body's own repair process. When platelets activate to form a clot, they also release alpha granules which have hundreds of chemical messengers in them that initiate and organize repair to the damaged tissue. Precisely placing PRP into the site of injury will initiate the healing process by activating on the damaged cartilage or tendon. This is an inflammatory process, and inflammation is the vital first phase of Healing.  What to expect and how to prepare for PRP   2 weeks prior to the procedure: depending on the procedure, you may need to arrange for a driver to bring you home. IF you are having a lower extremity procedure, we can provide crutches as needed.   10 days prior to the procedure: Stop taking anti-inflammatory drugs like Ibuprofen/Motrin, Advil, Aleve Naprosyn, Celebrex, or Meloxicam. Even aspirin should be stopped (but need to discuss this with Dr. Rolena Infante and your cardiologist beforehand). Let Dr. Rolena Infante know if you have been taking prednisone or other corticosteroids in the last month.   The day before the procedure: thoroughly shower and clean your skin.    The day of the procedure: Wear loose-fitting clothing like sweatpants or shorts. If you are having an upper body procedure wear a top that can button or zip up.  PRP will initiate healing and a productive inflammation, and PRP therapy will make  the body part treated sore for 4 days to two weeks. Anti-inflammatory drugs (i.e. ibuprofen, Naprosyn, Celebrex) and corticosteroids such as prednisone can blunt or stop this process, so it is important to not take any anti-inflammatory drugs for 7 days before getting PRP therapy, or for at least three weeks after PRP therapy. Corticosteroid injections can blunt inflammation for 30 days, so let us know if you have had one recently. Depending on the body part injected, you may be in a sling or on crutches for several days. Just like wringing out a wet dishcloth, if you load or tense a tendon or ligament that has just been injected with PRP, some of the PRP injected will squish out. By keeping the body part treated relaxed by using a sling (for the shoulder or arm) or crutches (for hips and legs) for a few days, the PRP can bind in place and do its job.    Tobacco/nicotine is a potent toxin and its use constricts small blood vessels which are needed for tissue repair.  Tobacco/nicotine use will limit the effectiveness of any treatment and stopping tobacco use is one of the single  greatest actions you can take to improve your health. Avoid toxins like alcohol, which inhibits and depresses the cells needed for tissue repair.  What happens during the PRP procedure?  Platelet rich plasma is made by taking some of your blood and performing a two-stage centrifuge process on it to concentrate the PRP. First, your blood is drawn into a syringe with a small amount of anti-coagulant in it (this is to keep the blood from clotting during  this process). The amount of blood drawn is usually about 10-30 milliliters, depending on how much PRP is needed for the treatment.  (There are 355 milliliters in a 12-ounce soda can for comparison).  Then the blood is transferred in a sterile fashion into a centrifuge tube. It is then centrifuged for the first cycle where the red blood cells are isolated and discarded.  In the second centrifuge cycle, the platelet-rich fraction of the remaining plasma is concentrated and placed in a syringe. The skin at the injection site is numbed with a small amount of topical cooling spray. Dr Dianah Field will then precisely inject the PRP into the injury site using ultrasound guidance.  What to do after your procedure  I will give you specific medicine to control any discomfort you may have after the procedure if you need it. Avoid NSAIDs like ibuprofen, aleve, motrin Acetaminophen can be used for mild pain.  Depending on the part of the body treated, usually you will be placed in a sling or on crutches for 1 to 3 days. Do your best not to tense or load the treated area during this time. After 3 days, unless otherwise instructed, the treated body part should be used and slowly moved through its full range of motion. It will be sore, but you will not be doing damage by moving it, in fact it needs to move to heal. If you were on crutches for a period of time, walking is ok once you are off the crutches. For now, avoid activities that specifically hurt you before being treated. Exercise is vital to good health and finding a way to cross train around your injury is important not only for your physical health, but for your mental health as well. Ask me about cross training options for your injury. Some brief (10 minutes or less) period of heat or ice therapy will not hurt the therapy, but it is not required. Usually, depending on the initial injury, physical therapy is started from two weeks to four weeks after injection. Improvements in pain and function should be expected from 8 weeks to 12 weeks after injection and some injuries may require more than one treatment.

## 2022-08-15 NOTE — Progress Notes (Signed)
Has tried and failed multiple approaches for her left knee  Here today to discuss PRP injection

## 2022-09-03 ENCOUNTER — Ambulatory Visit (HOSPITAL_BASED_OUTPATIENT_CLINIC_OR_DEPARTMENT_OTHER)
Admission: RE | Admit: 2022-09-03 | Discharge: 2022-09-03 | Disposition: A | Discharge status: Home / Self Care | Payer: BC Managed Care – PPO | Source: Ambulatory Visit | Attending: Nurse Practitioner | Admitting: Nurse Practitioner

## 2022-09-03 DIAGNOSIS — Z1239 Encounter for other screening for malignant neoplasm of breast: Secondary | ICD-10-CM | POA: Diagnosis not present

## 2022-09-03 DIAGNOSIS — Z1231 Encounter for screening mammogram for malignant neoplasm of breast: Secondary | ICD-10-CM | POA: Insufficient documentation

## 2022-09-17 ENCOUNTER — Encounter: Payer: Self-pay | Admitting: Sports Medicine

## 2022-09-17 ENCOUNTER — Ambulatory Visit: Payer: BC Managed Care – PPO | Admitting: Sports Medicine

## 2022-09-17 ENCOUNTER — Ambulatory Visit: Payer: Self-pay

## 2022-09-17 DIAGNOSIS — M1712 Unilateral primary osteoarthritis, left knee: Secondary | ICD-10-CM

## 2022-09-17 DIAGNOSIS — G8929 Other chronic pain: Secondary | ICD-10-CM

## 2022-09-17 DIAGNOSIS — M25562 Pain in left knee: Secondary | ICD-10-CM

## 2022-09-17 NOTE — Progress Notes (Signed)
   Procedure Note  Patient: Yvonne [name]             Date of Birth: Apr 11, 1971           MRN: [id number]             Visit Date: 09/17/2022  Kalene presents today for left knee PRP injection. Since last visit she did purchase the BodyHelix knee sleeve.  Procedures: Visit Diagnoses:  1. Unilateral primary osteoarthritis, left knee   2. Chronic pain of left knee    Ultrasound-guided PRP Knee Injection, Left: After discussion on risks/benefits/indications, informed verbal consent was obtained and a timeout was performed. The patient was lying supine on exam table with knee bolster underneath affected knee for comfort. The patient's right knee was prepped with Chloraprep and alcohol swabs. Utilizing ultrasound-guidance with the probe in a transverse position, the patient's suprapatellar bursa was identified and the surrounding soft tissue area was anesthesized first with a 25G, 1.5" needle with approximately 3 cc of lidocaine 1% using sterile technique, but none was delivered into the knee joint or bursa itself. Following this, using ultrasound guidance via an in-plane approach, a 22G, 1.5" needle was directed into the suprapatellar pouch of the knee joint and subsequently injected intraarticularly with 6 cc of platelet-rich-plasma (leukocyte-poor). Visualization of injectate spread within the knee joint was visualized dynamically under ultrasound guidance. Patient tolerated the procedure well without immediate complications.   Kit: RegenLab: RegenPlasma; BCT-3 kit   *Post-PRP Injection Guidelines: No anti-inflammatories (ibuprofen/motrin, aleve, meloxicam, etc.) for 2 weeks.  No ice for 2 weeks.  Short prescription of tramadol may be written if pain is severe, call if needed. Appropriate rest / bracing / and timeframe for beginning therapy discussed and patient endorsed understanding. Patient may use crutches or cane if needed over that time. Will begin aquatic-based PT at [facility] in 2  weeks.  Elba Barman, DO Primary Care Sports Medicine Physician  [place]  This note was dictated using Dragon naturally speaking software and may contain errors in syntax, spelling, or content which have not been identified prior to signing this note.

## 2022-09-17 NOTE — Patient Instructions (Addendum)
*  Post-PRP Injection Guidelines:  No anti-inflammatories (ibuprofen/motrin, aleve, meloxicam, etc.) for 2 weeks.  No ice for 2 weeks. May use Icy-Hot topical gel, but do not use the one with Arnica gel. Short prescription of tramadol may be written if pain is severe, call if needed. Appropriate rest / bracing / and timeframe for beginning therapy discussed and patient endorsed understanding. You may use crutches or cane if needed over that time. Will begin aquatic-based PT at [facility] in 2 weeks. Follow-up in 2 months.  - Dr. Rolena Infante

## 2022-09-18 ENCOUNTER — Encounter: Payer: Self-pay | Admitting: Sports Medicine

## 2022-09-18 ENCOUNTER — Other Ambulatory Visit: Payer: Self-pay | Admitting: Sports Medicine

## 2022-09-18 ENCOUNTER — Ambulatory Visit: Payer: BC Managed Care – PPO | Admitting: Nurse Practitioner

## 2022-09-18 MED ORDER — TRAMADOL HCL 50 MG PO TABS: 50.0000 mg | ORAL_TABLET | Freq: Four times a day (QID) | ORAL | 0 refills | Status: DC | PRN

## 2022-09-18 NOTE — Progress Notes (Signed)
Short course of Tramadol sent in for breakthrough pain. Patient had PRP injection on 09/17/21. Cannot take NSAIDs.  Elba Barman, DO Primary Care Sports Medicine Physician  [place]

## 2022-09-26 ENCOUNTER — Other Ambulatory Visit: Payer: Self-pay | Admitting: Internal Medicine

## 2022-09-26 DIAGNOSIS — E7841 Elevated Lipoprotein(a): Secondary | ICD-10-CM | POA: Diagnosis not present

## 2022-09-26 DIAGNOSIS — E7849 Other hyperlipidemia: Secondary | ICD-10-CM | POA: Diagnosis not present

## 2022-09-27 LAB — NMR, LIPOPROFILE
Cholesterol, Total: 154 mg/dL (ref 100–199)
HDL Particle Number: 43.1 umol/L (ref >=30.5)
HDL-C: 69 mg/dL (ref >39)
LDL Particle Number: 673 nmol/L (ref <1000)
LDL Size: 21.4 nm (ref >20.5)
LDL-C (NIH Calc): 73 mg/dL (ref 0–99)
LP-IR Score: 36 (ref <=45)
Small LDL Particle Number: 297 nmol/L (ref <=527)
Triglycerides: 61 mg/dL (ref 0–149)

## 2022-09-27 LAB — LIPOPROTEIN A (LPA): Lipoprotein (a): 434.4 nmol/L — ABNORMAL HIGH (ref <75.0)

## 2022-09-30 ENCOUNTER — Encounter (HOSPITAL_BASED_OUTPATIENT_CLINIC_OR_DEPARTMENT_OTHER): Payer: Self-pay | Admitting: Internal Medicine

## 2022-09-30 ENCOUNTER — Ambulatory Visit (HOSPITAL_BASED_OUTPATIENT_CLINIC_OR_DEPARTMENT_OTHER): Payer: BC Managed Care – PPO | Admitting: Internal Medicine

## 2022-09-30 VITALS — BP 134/88 | HR 72 | Ht 65.0 in | Wt 168.4 lb

## 2022-09-30 DIAGNOSIS — E7849 Other hyperlipidemia: Secondary | ICD-10-CM | POA: Diagnosis not present

## 2022-09-30 DIAGNOSIS — E7841 Elevated Lipoprotein(a): Secondary | ICD-10-CM

## 2022-09-30 NOTE — Patient Instructions (Signed)
Medication Instructions:  NO CHANGES  *If you need a refill on your cardiac medications before your next appointment, please call your pharmacy*   Lab Work: FASTING lab work to check cholesterol in 6 months -- complete about 1 week before next visit  If you have labs (blood work) drawn today and your tests are completely normal, you will receive your results only by: [place] (if you have MyChart) OR A paper copy in the mail If you have any lab test that is abnormal or we need to change your treatment, we will call you to review the results.   Follow-Up: At [facility], you and your health needs are our priority.  As part of our continuing mission to provide you with exceptional heart care, we have created designated Provider Care Teams.  These Care Teams include your primary Cardiologist (physician) and Advanced Practice Providers (APPs -  Physician Assistants and Nurse Practitioners) who all work together to provide you with the care you need, when you need it.  We recommend signing up for the patient portal called "MyChart".  Sign up information is provided on this After Visit Summary.  MyChart is used to connect with patients for Virtual Visits (Telemedicine).  Patients are able to view lab/test results, encounter notes, upcoming appointments, etc.  Non-urgent messages can be sent to your provider as well.   To learn more about what you can do with MyChart, go to [email or web address].    Your next appointment:    6 months with Dr. Debara Pickett

## 2022-09-30 NOTE — Progress Notes (Signed)
LIPID CLINIC CONSULT NOTE  Chief Complaint:  Follow-up dyslipidemia  Primary Care Physician: Minette Brine, [place]  Primary Cardiologist:  None  HPI:  Vaida Kerchner is a 52 y.o. female who is being seen today for the evaluation of dyslipidemia at the request of Minette Brine, [place].  This is a pleasant 52 year old female kindly referred for evaluation and management of dyslipidemia.  She reports history of elevated cholesterol however more recently her cholesterol has trended upwards.  This is also notable to be associated with weight gain, less activity due to struggles with left knee pain (she has an upcoming repeat knee surgery) and an atherogenic diet.  She reports a family history of heart disease including her maternal grandfather had an MI in his 58s and her mother who had high cholesterol and is of heart care patient.  Also more recently she tried a ketogenic diet.  This seems to correlate with her increasing cholesterol particularly her LDL.  Her most recent lipid profile showed total cholesterol 294, triglycerides 85, HDL 78 and LDL 202.  Hemoglobin A1c was 6.1.  She is not on treatment for diabetes or any medications for that.  11/06/2021  Mrs. Ashby returns today for follow-up.  Unfortunately, after changing insurance programs her Repatha was no longer covered.  We then had to consider another option.  Although she had had myalgias with statins before, she had not previously tried atorvastatin and she was willing to try it.  She was then placed on atorvastatin 40 mg daily.  Her lipids now actually do look excellent.  LDL particle #534, LDL-C71, HDL 89, triglycerides 54 and small LDL particle numbers are almost undetectable.  Unfortunately, she did have an elevated LP(a) which will not be reduced with a statin as it was with the PCSK9 inhibitor.  Nonetheless, she seems to be doing well with this therapy  05/21/2022  Mrs. Erler is seen today in follow-up.  Unfortunately she was  previously on Repatha however her insurance did not cover it.  Then she agreed to trying atorvastatin.  Subsequently, however we did note that her LP(a) is significantly elevated at 362.  After switching to atorvastatin 40 mg daily, her cholesterol has been suboptimally controlled.  Total cholesterol now 223, triglycerides 63, HDL 100 and LDL 112.  Her target LDL is less than 70 which she remains well above on high intensity statin therapy.  She will need additional therapy for this and a PCSK9 inhibitor as previously tolerated would be the best option.  09/30/2022  Ms. Pickerel returns today for follow-up.  She has had further significant reduction in her lipids with a combination of Repatha and atorvastatin.  Her LDL is now down to 73 from 112, HDL 69, triglycerides 61.  LDL particle number is 673.  This is back about where she was almost a year ago when insurance and somewhat forced her to come off of her PCSK9 inhibitor.  The only other change was that her LP(a), for some reason has increased from 362 up to 434.  The etiology of this is not clear.  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    Hx: UTI (urinary tract infection) 05/06/2011   Hyperlipidemia    Hypertension    PONV (postoperative nausea and vomiting)    Vulvitis 02/04/2011  Yeast infection 06/19/2010    Past Surgical History:  Procedure Laterality Date   ABDOMINAL HYSTERECTOMY     CESAREAN SECTION  2008 & 2009   COLONOSCOPY  11/24/2019   CRYOTHERAPY  2009   CYSTOSCOPY  06/15/2012   Procedure: CYSTOSCOPY;  Surgeon: Delice Lesch, MD;  Location: [facility];  Service: Gynecology;  Laterality: N/A;   KNEE ARTHROSCOPY Left 05/16/2020   Procedure: LEFT KNEE ARTHROSCOPY, PARTIAL MENISCECTOMY;  Surgeon: Melrose Nakayama, MD;  Location: [facility];  Service: Orthopedics;  Laterality: Left;    KNEE ARTHROSCOPY WITH MEDIAL MENISECTOMY Left 10/13/2020   Procedure: LEFT KNEE ARTHROSCOPY WITH PARTIAL MEDIAL MENISCECTOMY;  Surgeon: Leandrew Koyanagi, MD;  Location: [facility];  Service: Orthopedics;  Laterality: Left;   LAPAROSCOPIC HYSTERECTOMY  06/15/2012   Procedure: HYSTERECTOMY TOTAL LAPAROSCOPIC;  Surgeon: Delice Lesch, MD;  Location: [facility];  Service: Gynecology;  Laterality: N/A;   MYOMECTOMY  2005   TUBAL LIGATION     bilateral    FAMHx:  Family History  Problem Relation Age of Onset   Hypertension Mother    Heart disease Mother    Diabetes Father    Alcohol abuse Father    Diabetes Brother    Hypertension Brother    Alcohol abuse Brother    Heart attack Maternal Grandfather    Stomach cancer Maternal Grandfather    Hypertension Brother    Crohn's disease Other        mat 2nd cousin   Hyperlipidemia Other    Irritable bowel syndrome Other        mat. nephew   Colon cancer Neg Hx    Breast cancer Neg Hx    Colon polyps Neg Hx    Esophageal cancer Neg Hx    Rectal cancer Neg Hx     SOCHx:   reports that she has never smoked. She has never used smokeless tobacco. She reports current alcohol use of about 6.0 standard drinks of alcohol per week. She reports that she does not use drugs.  ALLERGIES:  Allergies  Allergen Reactions   Statins Other (See Comments)    myalgias   Sulfa Antibiotics Itching    ROS: Pertinent items noted in HPI and remainder of comprehensive ROS otherwise negative.  HOME MEDS: Current Outpatient Medications on File Prior to Visit  Medication Sig Dispense Refill   Adapalene 0.3 % gel Apply 1 application topically as needed (zits).      atorvastatin (LIPITOR) 40 MG tablet Take 1 tablet (40 mg total) by mouth daily. Please keep scheduled appointment 30 tablet 1   Cyanocobalamin (VITAMIN B 12 PO) Take 1 tablet by mouth daily.      cyclobenzaprine (FLEXERIL) 10 MG tablet Take 1 tablet (10 mg total) by mouth 3 (three)  times daily as needed for muscle spasms. 30 tablet 0   Evolocumab (REPATHA SURECLICK) 431 MG/ML SOAJ Inject 1 Dose into the skin every 14 (fourteen) days. 6 mL 3   ipratropium (ATROVENT) 0.03 % nasal spray PLACE 1 SPRAY INTO BOTH NOSTRILS DAILY 30 mL 1   Melatonin 5 MG CHEW Chew by mouth as needed.     Multiple Vitamins-Minerals (CENTRUM ADULTS PO) Take 1 tablet by mouth daily.     Omega-3 Fatty Acids (FISH OIL PO) Take 1 capsule by mouth daily.     omeprazole (PRILOSEC) 40 MG capsule Take 1 capsule (40 mg total) by mouth daily. (Patient taking differently: Take 20 mg by mouth as needed (indigestion or heartburn).)  90 capsule 3   PARoxetine (PAXIL) 10 MG tablet Take 1 tablet (10 mg total) by mouth daily. 90 tablet 1   Semaglutide, 1 MG/DOSE, (OZEMPIC, 1 MG/DOSE,) 4 MG/3ML SOPN INJECT '1MG'$  INTO THE SKIN ONCE A WEEK 9 mL 1   traMADol (ULTRAM) 50 MG tablet Take 1-2 tablets (50-100 mg total) by mouth every 6 (six) hours as needed for severe pain. 20 tablet 0   valACYclovir (VALTREX) 1000 MG tablet TAKE 1 TABLET BY MOUTH EVERY 12 HOURS (Patient taking differently: Take 1,000 mg by mouth as needed (cold sore outbreak).) 180 tablet 1   No current facility-administered medications on file prior to visit.    LABS/IMAGING: No results found for this or any previous visit (from the past 48 hour(s)). No results found.  LIPID PANEL:    Component Value Date/Time   CHOL 156 06/18/2022 1150   TRIG 65 06/18/2022 1150   HDL 83 06/18/2022 1150   CHOLHDL 1.9 06/18/2022 1150   LDLCALC 60 06/18/2022 1150    WEIGHTS: Wt Readings from Last 3 Encounters:  09/30/22 168 lb 6.4 oz (76.4 kg)  06/18/22 163 lb 3.2 oz (74 kg)  05/21/22 161 lb 6.4 oz (73.2 kg)    VITALS: BP 134/88   Pulse 72   Ht '5\' 5"'$  (1.651 m)   Wt 168 lb 6.4 oz (76.4 kg)   LMP 05/25/2012   SpO2 97%   BMI 28.02 kg/m   EXAM: Deferred  EKG: Deferred  ASSESSMENT: Dyslipidemia with LDL greater than 190, suggestive of  dyslipidemia Family history of coronary disease Zero coronary artery calcium (09/2020) Subcentimeter right middle lobe pulmonary nodule (09/2020) 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   Ms. Crosley has had further reduction in her lipids now being reestablished with PCSK9 inhibitor.  Her LP(a) however has gone up to 434 nmol/L.  We discussed the possibility of adding low-dose aspirin to her therapy as there is some evidence that that may help mitigate her risk of high LP(a) and the potential thrombotic risk of this.  For now, however her overall lipid profile substantially better on combination therapy with statin and Repatha continue this.  Repeat lipid in 6 months and follow-up afterwards.  Pixie Casino, MD, [facility], [place] Director of the Advanced Lipid Disorders &  Cardiovascular Risk Reduction Clinic Diplomate of the American Board of Clinical Lipidology Attending Cardiologist  Direct Dial: [phone]  Fax: [phone]  Website:  [email or web address]  Nadean Corwin Stephie Xu 09/30/2022, 1:29 PM

## 2022-10-02 ENCOUNTER — Ambulatory Visit: Payer: BC Managed Care – PPO | Admitting: Nurse Practitioner

## 2022-10-02 ENCOUNTER — Encounter: Payer: Self-pay | Admitting: Nurse Practitioner

## 2022-10-02 VITALS — BP 128/78 | HR 67 | Temp 98.3°F | Ht 65.0 in | Wt 168.0 lb

## 2022-10-02 DIAGNOSIS — G8929 Other chronic pain: Secondary | ICD-10-CM

## 2022-10-02 DIAGNOSIS — E785 Hyperlipidemia, unspecified: Secondary | ICD-10-CM

## 2022-10-02 DIAGNOSIS — E559 Vitamin D deficiency, unspecified: Secondary | ICD-10-CM

## 2022-10-02 DIAGNOSIS — E7849 Other hyperlipidemia: Secondary | ICD-10-CM | POA: Diagnosis not present

## 2022-10-02 DIAGNOSIS — R5383 Other fatigue: Secondary | ICD-10-CM

## 2022-10-02 DIAGNOSIS — E1169 Type 2 diabetes mellitus with other specified complication: Secondary | ICD-10-CM | POA: Diagnosis not present

## 2022-10-02 DIAGNOSIS — M25562 Pain in left knee: Secondary | ICD-10-CM

## 2022-10-02 NOTE — Progress Notes (Signed)
I,Lasya Vetter,acting as a Education administrator for Minette Brine, FNP.,have documented all relevant documentation on the behalf of Minette Brine, FNP,as directed by  Minette Brine, FNP while in the presence of Minette Brine, [place].    Subjective:     Patient ID: Yvonne [name] , female    DOB: 1971-02-08 , 52 y.o.   MRN: [id number]   Chief Complaint  Patient presents with   Follow-up    Blood pressure and Pre-DM    HPI  Patient presents today for follow up visit regarding blood pressure and pre-diabetes. She continues with repatha. Her lipoprotein levels went up.   Wt Readings from Last 3 Encounters: 10/02/22 : 168 lb (76.2 kg) 09/30/22 : 168 lb 6.4 oz (76.4 kg) 06/18/22 : 163 lb 3.2 oz (74 kg)       Past Medical History:  Diagnosis Date   Allergy    Depression    Diabetes mellitus without complication ([place])    Fibroid 2005   H/O menorrhagia 01/16/2011   Herpes 2009   HPV in female    Hx: UTI (urinary tract infection) 05/06/2011   Hyperlipidemia    Hypertension    PONV (postoperative nausea and vomiting)    Vulvitis 02/04/2011   Yeast infection 06/19/2010     Family History  Problem Relation Age of Onset   Hypertension Mother    Heart disease Mother    Diabetes Father    Alcohol abuse Father    Diabetes Brother    Hypertension Brother    Alcohol abuse Brother    Heart attack Maternal Grandfather    Stomach cancer Maternal Grandfather    Hypertension Brother    Crohn's disease Other        mat 2nd cousin   Hyperlipidemia Other    Irritable bowel syndrome Other        mat. nephew   Colon cancer Neg Hx    Breast cancer Neg Hx    Colon polyps Neg Hx    Esophageal cancer Neg Hx    Rectal cancer Neg Hx      Current Outpatient Medications:    Adapalene 0.3 % gel, Apply 1 application topically as needed (zits). , Disp: , Rfl:    atorvastatin (LIPITOR) 40 MG tablet, Take 1 tablet (40 mg total) by mouth daily. Please keep scheduled appointment, Disp: 30 tablet, Rfl: 1    Cyanocobalamin (VITAMIN B 12 PO), Take 1 tablet by mouth daily. , Disp: , Rfl:    Evolocumab (REPATHA SURECLICK) [id number] MG/ML SOAJ, Inject 1 Dose into the skin every 14 (fourteen) days., Disp: 6 mL, Rfl: 3   ipratropium (ATROVENT) 0.03 % nasal spray, PLACE 1 SPRAY INTO BOTH NOSTRILS DAILY, Disp: 30 mL, Rfl: 1   Melatonin 5 MG CHEW, Chew by mouth as needed., Disp: , Rfl:    Multiple Vitamins-Minerals (CENTRUM ADULTS PO), Take 1 tablet by mouth daily., Disp: , Rfl:    Omega-3 Fatty Acids (FISH OIL PO), Take 1 capsule by mouth daily., Disp: , Rfl:    omeprazole (PRILOSEC) 40 MG capsule, Take 1 capsule (40 mg total) by mouth daily. (Patient taking differently: Take 20 mg by mouth as needed (indigestion or heartburn).), Disp: 90 capsule, Rfl: 3   PARoxetine (PAXIL) 10 MG tablet, Take 1 tablet (10 mg total) by mouth daily., Disp: 90 tablet, Rfl: 1   Semaglutide, 1 MG/DOSE, (OZEMPIC, 1 MG/DOSE,) 4 MG/3ML SOPN, INJECT 1MG INTO THE SKIN ONCE A WEEK, Disp: 9 mL, Rfl: 1   traMADol (ULTRAM) 50  MG tablet, Take 1-2 tablets (50-100 mg total) by mouth every 6 (six) hours as needed for severe pain., Disp: 20 tablet, Rfl: 0   valACYclovir (VALTREX) 1000 MG tablet, TAKE 1 TABLET BY MOUTH EVERY 12 HOURS (Patient taking differently: Take 1,000 mg by mouth as needed (cold sore outbreak).), Disp: 180 tablet, Rfl: 1   Allergies  Allergen Reactions   Statins Other (See Comments)    myalgias   Sulfa Antibiotics Itching     Review of Systems  Constitutional: Negative.   Respiratory: Negative.    Cardiovascular: Negative.   Neurological: Negative.   Psychiatric/Behavioral: Negative.       Today's Vitals   10/02/22 1139  BP: 128/78  Pulse: 67  Temp: 98.3 F (36.8 C)  TempSrc: Oral  SpO2: 98%  Weight: 168 lb (76.2 kg)  Height: 5' 5"$  (1.651 m)   Body mass index is 27.96 kg/m.   Objective:  Physical Exam Vitals reviewed.  Constitutional:      General: She is not in acute distress.    Appearance: Normal  appearance.  Cardiovascular:     Rate and Rhythm: Normal rate and regular rhythm.     Pulses: Normal pulses.     Heart sounds: Normal heart sounds. No murmur heard. Pulmonary:     Effort: Pulmonary effort is normal. No respiratory distress.     Breath sounds: Normal breath sounds. No wheezing.  Skin:    Capillary Refill: Capillary refill takes less than 2 seconds.  Neurological:     General: No focal deficit present.     Mental Status: She is alert and oriented to person, place, and time.     Cranial Nerves: No cranial nerve deficit.     Motor: No weakness.  Psychiatric:        Mood and Affect: Mood normal.        Thought Content: Thought content normal.        Judgment: Judgment normal.         Assessment And Plan:     1. Type 2 diabetes mellitus with hyperlipidemia (HCC) Comments: Hemoglobin A1c is improved.  Has been taking Ozempic without difficulty. - Hemoglobin A1c - BMP8+eGFR - Microalbumin / Creatinine Urine Ratio  2. Other fatigue Comments: Will check for metabolic causes. - Vitamin B12 - TSH - Iron, TIBC and Ferritin Panel  3. Vitamin D deficiency - VITAMIN D 25 Hydroxy (Vit-D Deficiency, Fractures)  4. Familial hyperlipidemia Comments: Continue follow-up with the lipid clinic. - BMP8+eGFR  5. Chronic pain of left knee Comments: This continues to limit her ability to exercise regularly but is doing better.  Continue follow-up with orthopedics.     Patient was given opportunity to ask questions. Patient verbalized understanding of the plan and was able to repeat key elements of the plan. All questions were answered to their satisfaction.  Minette Brine, FNP   I, Minette Brine, FNP, have reviewed all documentation for this visit. The documentation on 10/02/22 for the exam, diagnosis, procedures, and orders are all accurate and complete.   IF YOU HAVE BEEN REFERRED TO A SPECIALIST, IT MAY TAKE 1-2 WEEKS TO SCHEDULE/PROCESS THE REFERRAL. IF YOU HAVE NOT HEARD  FROM US/SPECIALIST IN TWO WEEKS, PLEASE GIVE [name] A CALL AT [phone].   THE PATIENT IS ENCOURAGED TO PRACTICE SOCIAL DISTANCING DUE TO THE COVID-19 PANDEMIC.

## 2022-10-03 LAB — BMP8+EGFR
BUN/Creatinine Ratio: 19 (ref 9–23)
BUN: 12 mg/dL (ref 6–24)
CO2: 24 mmol/L (ref 20–29)
Calcium: 9.8 mg/dL (ref 8.7–10.2)
Chloride: 100 mmol/L (ref 96–106)
Creatinine, Ser: 0.64 mg/dL (ref 0.57–1.00)
Glucose: 86 mg/dL (ref 70–99)
Potassium: 4.2 mmol/L (ref 3.5–5.2)
Sodium: 140 mmol/L (ref 134–144)
eGFR: 106 mL/min/{1.73_m2} (ref >59)

## 2022-10-03 LAB — IRON,TIBC AND FERRITIN PANEL
Ferritin: 266 ng/mL — ABNORMAL HIGH (ref 15–150)
Iron Saturation: 29 % (ref 15–55)
Iron: 96 ug/dL (ref 27–159)
Total Iron Binding Capacity: 334 ug/dL (ref 250–450)
UIBC: 238 ug/dL (ref 131–425)

## 2022-10-03 LAB — VITAMIN B12: Vitamin B-12: 1873 pg/mL — ABNORMAL HIGH (ref 232–1245)

## 2022-10-03 LAB — TSH: TSH: 1.01 u[IU]/mL (ref 0.450–4.500)

## 2022-10-03 LAB — VITAMIN D 25 HYDROXY (VIT D DEFICIENCY, FRACTURES): Vit D, 25-Hydroxy: 51.7 ng/mL (ref 30.0–100.0)

## 2022-10-03 LAB — HEMOGLOBIN A1C
Est. average glucose Bld gHb Est-mCnc: 117 mg/dL
Hgb A1c MFr Bld: 5.7 % — ABNORMAL HIGH (ref 4.8–5.6)

## 2022-10-04 LAB — MICROALBUMIN / CREATININE URINE RATIO
Creatinine, Urine: 20.6 mg/dL
Microalb/Creat Ratio: <15 mg/g creat (ref 0–29)
Microalbumin, Urine: <3 ug/mL

## 2022-10-09 ENCOUNTER — Encounter: Payer: Self-pay | Admitting: Nurse Practitioner

## 2022-10-16 ENCOUNTER — Other Ambulatory Visit: Payer: Self-pay

## 2022-10-16 ENCOUNTER — Ambulatory Visit (HOSPITAL_BASED_OUTPATIENT_CLINIC_OR_DEPARTMENT_OTHER): Payer: BC Managed Care – PPO | Attending: Orthopaedic Surgery | Admitting: Physical Therapy

## 2022-10-16 ENCOUNTER — Encounter (HOSPITAL_BASED_OUTPATIENT_CLINIC_OR_DEPARTMENT_OTHER): Payer: Self-pay | Admitting: Physical Therapy

## 2022-10-16 DIAGNOSIS — M25562 Pain in left knee: Secondary | ICD-10-CM | POA: Insufficient documentation

## 2022-10-16 DIAGNOSIS — G8929 Other chronic pain: Secondary | ICD-10-CM | POA: Diagnosis not present

## 2022-10-16 DIAGNOSIS — M1712 Unilateral primary osteoarthritis, left knee: Secondary | ICD-10-CM | POA: Insufficient documentation

## 2022-10-16 DIAGNOSIS — R262 Difficulty in walking, not elsewhere classified: Secondary | ICD-10-CM | POA: Insufficient documentation

## 2022-10-16 DIAGNOSIS — M6281 Muscle weakness (generalized): Secondary | ICD-10-CM | POA: Insufficient documentation

## 2022-10-16 NOTE — Therapy (Signed)
OUTPATIENT PHYSICAL THERAPY LOWER EXTREMITY EVALUATION   Patient Name: Yvonne [name] MRN: [id number] DOB:19-Mar-1971, 52 y.o., female Today's Date: 10/16/2022  END OF SESSION:  PT End of Session - 10/16/22 1853     Visit Number 1    Number of Visits 12    Date for PT Re-Evaluation 11/27/22    Authorization Type BCBS    PT Start Time 1120    PT Stop Time 1155    PT Time Calculation (min) 35 min    Activity Tolerance Patient tolerated treatment well    Behavior During Therapy [facility] for tasks assessed/performed             Past Medical History:  Diagnosis Date   Allergy    Depression    Diabetes mellitus without complication ([place])    Fibroid 2005   H/O menorrhagia 01/16/2011   Herpes 2009   HPV in female    Hx: UTI (urinary tract infection) 05/06/2011   Hyperlipidemia    Hypertension    PONV (postoperative nausea and vomiting)    Vulvitis 02/04/2011   Yeast infection 06/19/2010   Past Surgical History:  Procedure Laterality Date   ABDOMINAL HYSTERECTOMY     CESAREAN SECTION  2008 & 2009   COLONOSCOPY  11/24/2019   CRYOTHERAPY  2009   CYSTOSCOPY  06/15/2012   Procedure: CYSTOSCOPY;  Surgeon: Delice Lesch, MD;  Location: [facility];  Service: Gynecology;  Laterality: N/A;   KNEE ARTHROSCOPY Left 05/16/2020   Procedure: LEFT KNEE ARTHROSCOPY, PARTIAL MENISCECTOMY;  Surgeon: Melrose Nakayama, MD;  Location: [facility];  Service: Orthopedics;  Laterality: Left;   KNEE ARTHROSCOPY WITH MEDIAL MENISECTOMY Left 10/13/2020   Procedure: LEFT KNEE ARTHROSCOPY WITH PARTIAL MEDIAL MENISCECTOMY;  Surgeon: Leandrew Koyanagi, MD;  Location: [place];  Service: Orthopedics;  Laterality: Left;   LAPAROSCOPIC HYSTERECTOMY  06/15/2012   Procedure: HYSTERECTOMY TOTAL LAPAROSCOPIC;  Surgeon: Delice Lesch, MD;  Location: [facility];  Service: Gynecology;  Laterality: N/A;   MYOMECTOMY  2005   TUBAL LIGATION     bilateral   Patient Active Problem List   Diagnosis Date Noted    Pain in left foot 12/24/2021   Prediabetes 12/12/2021   Familial hyperlipidemia 02/06/2021   Chondromalacia, left knee 10/13/2020   Synovitis of knee 10/13/2020   Acute medial meniscal tear, left, initial encounter 09/22/2020   Constipation 06/21/2019   Mixed hyperlipidemia 12/15/2018   Depression 12/15/2018   S/P laparoscopic hysterectomy - 06/15/12 06/23/2012    PCP: Minette Brine, FNP   REFERRING PROVIDER: Vanetta Mulders, MD   REFERRING DIAG: [phone] (ICD-10-CM) - Unilateral primary osteoarthritis, left knee   THERAPY DIAG:  Chronic pain of left knee  Muscle weakness (generalized)  Difficulty in walking, not elsewhere classified  Rationale for Evaluation and Treatment: Rehabilitation  ONSET DATE: 2-3 years  SUBJECTIVE:   SUBJECTIVE STATEMENT: PRP Left knee a few weeks ago.  I feel great.  Able to walk with just a little a pain.  Had shots, scope no relief prior.  PERTINENT HISTORY: Dr Sammuel Hines referred to Dr Elba Barman for PRP PAIN:  Are you having pain? Yes: NPRS scale: 1-2/10 current; worst 2/10; min 0/10/10 Pain location: left knee Pain description: ache Aggravating factors: walking; standing Relieving factors: sitting; tramadol  PRECAUTIONS: Knee  WEIGHT BEARING RESTRICTIONS: No  FALLS:  Has patient fallen in last 6 months? No  LIVING ENVIRONMENT: Lives with: lives with their family Lives in: House/apartment Stairs: Yes: Internal: 16 steps; on right going  up Has following equipment at home: None  OCCUPATION: administration  PLOF: Independent  PATIENT GOALS: I want muscles back and walk and get my weight down  NEXT MD VISIT: 4 weeks  OBJECTIVE:   DIAGNOSTIC FINDINGS: none in chart  PATIENT SURVEYS:  FOTO risk adjusted 49% with goal of 62% visit 15  COGNITION: Overall cognitive status: Within functional limits for tasks assessed     SENSATION: WFL  EDEMA:  Minimal suprapatellar  MUSCLE LENGTH: Hamstrings:wfl   POSTURE: No  Significant postural limitations  PALPATION: TTP medial aspect of knee  LOWER EXTREMITY ROM:  Active ROM Right eval Left eval  Hip flexion    Hip extension    Hip abduction    Hip adduction    Hip internal rotation    Hip external rotation    Knee flexion 140 110  Knee extension 0 0  Ankle dorsiflexion    Ankle plantarflexion    Ankle inversion    Ankle eversion     (Blank rows = not tested)  LOWER EXTREMITY MMT:  MMT Right eval Left eval  Hip flexion  49.8  Hip extension    Hip abduction  46.5  Hip adduction    Hip internal rotation    Hip external rotation    Knee flexion  47.4  Knee extension    Ankle dorsiflexion    Ankle plantarflexion    Ankle inversion    Ankle eversion     (Blank rows = not tested)   FUNCTIONAL TESTS:  5 times sit to stand: 17.63 Timed up and go (TUG): 10.04  GAIT: Distance walked: 400 ft Assistive device utilized: None Level of assistance: Complete Independence Comments: Normal pattern during assessment   TODAY'S TREATMENT:                                                                                                                              Eval Objective testing   PATIENT EDUCATION:  Education details: Discussed eval findings, rehab rationale and POC and patient is in agreement Person educated: Patient Education method: Explanation Education comprehension: verbalized understanding  HOME EXERCISE PROGRAM: Aquatic HEP tba  ASSESSMENT:  CLINICAL IMPRESSION: Patient is a 52 y.o. f who was seen today for physical therapy evaluation and treatment for left knee oa.  Pt presents with good response to PRP on 09/17/22.  She reports significant decrease in knee pain with improvement in toleration tp amb.  She presents today without gait deviation although reports she feels as though she starts to limp after about 5 minutes of continuous walking. She has some weakness left knee. Unable to gain good contralateral strngth  numbers for comparision due to limitations of testing in setting. She is ready to begin skilled physical therapy which will begin aquatic based then progress to land.  Will keep her in aquatics for first few weeks to benefit from decreased forces to allow for optimal healing/PRP outcome.    OBJECTIVE IMPAIRMENTS:  Abnormal gait, decreased mobility, difficulty walking, decreased strength, and pain.   ACTIVITY LIMITATIONS: lifting, squatting, stairs, and transfers  PARTICIPATION LIMITATIONS: cleaning, shopping, community activity, and occupation  PERSONAL FACTORS: Time since onset of injury/illness/exacerbation are also affecting patient's functional outcome.   REHAB POTENTIAL: Good  CLINICAL DECISION MAKING: Evolving/moderate complexity  EVALUATION COMPLEXITY: Moderate   GOALS: Goals reviewed with patient? Yes  SHORT TERM GOALS: Target date: 11/06/22 Pt will tolerate full aquatic sessions consistently without increase in pain and with improving function to demonstrate good toleration and effectiveness of intervention.   Baseline: Goal status: INITIAL  2.  Pt will tolerate stair climbing using alternating pattern ascending and descending 6 steps without use of handrail Baseline:difficult  Goal status: INITIAL  3.  Pt will have improved toleration to amb tolerating >10 minutes of continuous amb Baseline: 5 minutes Goal status: INITIAL    LONG TERM GOALS: Target date: 11/27/22  Pt to meet stated Foto Goal of 62% Baseline: FOTO risk adjusted 49% Goal status: INITIAL  2.  Pt will be indep with final HEP's (land and aquatic as appropriate) for continued management of condition  Baseline: TBA Goal status: INITIAL  3.  Pt will improve on 5 X STS test to <or=  10s to demonstrate improving functional lower extremity strength, transitional movements, and balance.  Baseline: 17.63 Goal status: INITIAL  4.  Pt will improve left knee strength by 5-10lbs Baseline: see chart Goal  status: INITIAL     PLAN:  PT FREQUENCY: 1-2x/week  PT DURATION: 6 weeks  PLANNED INTERVENTIONS: Therapeutic exercises, Therapeutic activity, Neuromuscular re-education, Balance training, Gait training, Patient/Family education, Self Care, Joint mobilization, Stair training, DME instructions, Aquatic Therapy, Dry Needling, Electrical stimulation, Cryotherapy, Moist heat, Splintting, Taping, Ultrasound, Ionotophoresis 72m/ml Dexamethasone, Manual therapy, and Re-evaluation  PLAN FOR NEXT SESSION: aquatic: strengthening hip/knee R/L, toleration to amb; gait train; HEP   FDenton Meek PT MPT 10/16/2022, 6:55 PM

## 2022-10-17 ENCOUNTER — Encounter: Payer: Self-pay | Admitting: Sports Medicine

## 2022-10-21 ENCOUNTER — Other Ambulatory Visit: Payer: Self-pay | Admitting: Nurse Practitioner

## 2022-10-21 DIAGNOSIS — R748 Abnormal levels of other serum enzymes: Secondary | ICD-10-CM

## 2022-10-22 ENCOUNTER — Other Ambulatory Visit: Payer: Self-pay | Admitting: Internal Medicine

## 2022-10-23 ENCOUNTER — Encounter (HOSPITAL_BASED_OUTPATIENT_CLINIC_OR_DEPARTMENT_OTHER): Payer: Self-pay | Admitting: Physical Therapy

## 2022-10-23 ENCOUNTER — Ambulatory Visit (HOSPITAL_BASED_OUTPATIENT_CLINIC_OR_DEPARTMENT_OTHER): Payer: BC Managed Care – PPO | Admitting: Physical Therapy

## 2022-10-23 DIAGNOSIS — M25562 Pain in left knee: Secondary | ICD-10-CM | POA: Diagnosis not present

## 2022-10-23 DIAGNOSIS — R262 Difficulty in walking, not elsewhere classified: Secondary | ICD-10-CM

## 2022-10-23 DIAGNOSIS — G8929 Other chronic pain: Secondary | ICD-10-CM

## 2022-10-23 DIAGNOSIS — M6281 Muscle weakness (generalized): Secondary | ICD-10-CM | POA: Diagnosis not present

## 2022-10-23 DIAGNOSIS — M1712 Unilateral primary osteoarthritis, left knee: Secondary | ICD-10-CM | POA: Diagnosis not present

## 2022-10-23 NOTE — Therapy (Signed)
OUTPATIENT PHYSICAL THERAPY LOWER EXTREMITY EVALUATION   Patient Name: Yvonne [name] MRN: [id number] DOB:28-Sep-1970, 52 y.o., female Today's Date: 10/23/2022  END OF SESSION:  PT End of Session - 10/23/22 1142     Visit Number 2    Number of Visits 12    Date for PT Re-Evaluation 11/27/22    Authorization Type BCBS    PT Start Time 1033    PT Stop Time 1111    PT Time Calculation (min) 38 min    Activity Tolerance Patient tolerated treatment well    Behavior During Therapy [facility] for tasks assessed/performed              Past Medical History:  Diagnosis Date   Allergy    Depression    Diabetes mellitus without complication ([place])    Fibroid 2005   H/O menorrhagia 01/16/2011   Herpes 2009   HPV in female    Hx: UTI (urinary tract infection) 05/06/2011   Hyperlipidemia    Hypertension    PONV (postoperative nausea and vomiting)    Vulvitis 02/04/2011   Yeast infection 06/19/2010   Past Surgical History:  Procedure Laterality Date   ABDOMINAL HYSTERECTOMY     CESAREAN SECTION  2008 & 2009   COLONOSCOPY  11/24/2019   CRYOTHERAPY  2009   CYSTOSCOPY  06/15/2012   Procedure: CYSTOSCOPY;  Surgeon: Delice Lesch, MD;  Location: [facility];  Service: Gynecology;  Laterality: N/A;   KNEE ARTHROSCOPY Left 05/16/2020   Procedure: LEFT KNEE ARTHROSCOPY, PARTIAL MENISCECTOMY;  Surgeon: Melrose Nakayama, MD;  Location: [facility];  Service: Orthopedics;  Laterality: Left;   KNEE ARTHROSCOPY WITH MEDIAL MENISECTOMY Left 10/13/2020   Procedure: LEFT KNEE ARTHROSCOPY WITH PARTIAL MEDIAL MENISCECTOMY;  Surgeon: Leandrew Koyanagi, MD;  Location: [place] [facility];  Service: Orthopedics;  Laterality: Left;   LAPAROSCOPIC HYSTERECTOMY  06/15/2012   Procedure: HYSTERECTOMY TOTAL LAPAROSCOPIC;  Surgeon: Delice Lesch, MD;  Location: [facility];  Service: Gynecology;  Laterality: N/A;   MYOMECTOMY  2005   TUBAL LIGATION     bilateral   Patient Active Problem List   Diagnosis Date Noted    Pain in left foot 12/24/2021   Prediabetes 12/12/2021   Familial hyperlipidemia 02/06/2021   Chondromalacia, left knee 10/13/2020   Synovitis of knee 10/13/2020   Acute medial meniscal tear, left, initial encounter 09/22/2020   Constipation 06/21/2019   Mixed hyperlipidemia 12/15/2018   Depression 12/15/2018   S/P laparoscopic hysterectomy - 06/15/12 06/23/2012    PCP: Minette Brine, FNP   REFERRING PROVIDER: Vanetta Mulders, MD   REFERRING DIAG: [phone] (ICD-10-CM) - Unilateral primary osteoarthritis, left knee   THERAPY DIAG:  Chronic pain of left knee  Muscle weakness (generalized)  Difficulty in walking, not elsewhere classified  Rationale for Evaluation and Treatment: Rehabilitation  ONSET DATE: 2-3 years  SUBJECTIVE:   SUBJECTIVE STATEMENT: Sore after assessment needed to take meds  PERTINENT HISTORY: Dr Sammuel Hines referred to Dr Elba Barman for PRP PAIN:  Are you having pain? Yes: NPRS scale: 1-2/10 current; worst 2/10; min 0/10/10 Pain location: left knee Pain description: ache Aggravating factors: walking; standing Relieving factors: sitting; tramadol  PRECAUTIONS: Knee  WEIGHT BEARING RESTRICTIONS: No  FALLS:  Has patient fallen in last 6 months? No  LIVING ENVIRONMENT: Lives with: lives with their family Lives in: House/apartment Stairs: Yes: Internal: 16 steps; on right going up Has following equipment at home: None  OCCUPATION: administration  PLOF: Independent  PATIENT GOALS: I want muscles back  and walk and get my weight down  NEXT MD VISIT: 4 weeks  OBJECTIVE:   DIAGNOSTIC FINDINGS: none in chart  PATIENT SURVEYS:  FOTO risk adjusted 49% with goal of 62% visit 15  COGNITION: Overall cognitive status: Within functional limits for tasks assessed     SENSATION: WFL  EDEMA:  Minimal suprapatellar  MUSCLE LENGTH: Hamstrings:wfl   POSTURE: No Significant postural limitations  PALPATION: TTP medial aspect of knee  LOWER  EXTREMITY ROM:  Active ROM Right eval Left eval  Hip flexion    Hip extension    Hip abduction    Hip adduction    Hip internal rotation    Hip external rotation    Knee flexion 140 110  Knee extension 0 0  Ankle dorsiflexion    Ankle plantarflexion    Ankle inversion    Ankle eversion     (Blank rows = not tested)  LOWER EXTREMITY MMT:  MMT Right eval Left eval  Hip flexion  49.8  Hip extension    Hip abduction  46.5  Hip adduction    Hip internal rotation    Hip external rotation    Knee flexion  47.4  Knee extension    Ankle dorsiflexion    Ankle plantarflexion    Ankle inversion    Ankle eversion     (Blank rows = not tested)   FUNCTIONAL TESTS:  5 times sit to stand: 17.63 Timed up and go (TUG): 10.04  GAIT: Distance walked: 400 ft Assistive device utilized: None Level of assistance: Complete Independence Comments: Normal pattern during assessment   TODAY'S TREATMENT:                                                                                                                              Pt seen for aquatic therapy today.  Treatment took place in water 3.25-4.5 ft in depth at the [place]. Temp of water was 92.  Pt entered/exited the pool via stair using step to pattern with hand rail.  *Walking forward back and side stepping multiple widths 3.6 and 4.6 ft *Cycling on noodle x 4 widths; add/abd and XX skiing 2x 20 reps *Standing initiated support on wall then progressed to 1 foam hand buoy then without support: df; pf; hip   add/abd; hip extension; hip flex/extension x10; mini squats x10. *Side lunge using 1 foam hand buoy ue add/abd x 4 widths *STS from bench onto blue step 2x10  *Tandem forward and backward amb  *walking between tasks for recovery  Pt requires the buoyancy and hydrostatic pressure of water for support, and to offload joints by unweighting joint load by at least 50 % in navel deep water and by at least 75-80% in  chest to neck deep water.  Viscosity of the water is needed for resistance of strengthening. Water current perturbations provides challenge to standing balance requiring increased core activation.     PATIENT EDUCATION:  Education  details: Discussed eval findings, rehab rationale and POC and patient is in agreement Person educated: Patient Education method: Explanation Education comprehension: verbalized understanding  HOME EXERCISE PROGRAM: Aquatic HEP tba  ASSESSMENT:  CLINICAL IMPRESSION:  Pt safe and indep in setting with therapist instructing from deck. She initiates session entering pool moving in a very fast pace. Cues for decreased speed and purposeful movement. She moves with little effort from each task. Reports slight knee pain throughout session but does not limit her.  We end session just a little soon as I am concerned about her post session response due to intensity of session. Will monitor.   Initial Impression Patient is a 52 y.o. f who was seen today for physical therapy evaluation and treatment for left knee oa.  Pt presents with good response to PRP on 09/17/22.  She reports significant decrease in knee pain with improvement in toleration tp amb.  She presents today without gait deviation although reports she feels as though she starts to limp after about 5 minutes of continuous walking. She has some weakness left knee. Unable to gain good contralateral strngth numbers for comparision due to limitations of testing in setting. She is ready to begin skilled physical therapy which will begin aquatic based then progress to land.  Will keep her in aquatics for first few weeks to benefit from decreased forces to allow for optimal healing/PRP outcome.    OBJECTIVE IMPAIRMENTS: Abnormal gait, decreased mobility, difficulty walking, decreased strength, and pain.   ACTIVITY LIMITATIONS: lifting, squatting, stairs, and transfers  PARTICIPATION LIMITATIONS: cleaning, shopping,  community activity, and occupation  PERSONAL FACTORS: Time since onset of injury/illness/exacerbation are also affecting patient's functional outcome.   REHAB POTENTIAL: Good  CLINICAL DECISION MAKING: Evolving/moderate complexity  EVALUATION COMPLEXITY: Moderate   GOALS: Goals reviewed with patient? Yes  SHORT TERM GOALS: Target date: 11/06/22 Pt will tolerate full aquatic sessions consistently without increase in pain and with improving function to demonstrate good toleration and effectiveness of intervention.   Baseline: Goal status: INITIAL  2.  Pt will tolerate stair climbing using alternating pattern ascending and descending 6 steps without use of handrail Baseline:difficult  Goal status: INITIAL  3.  Pt will have improved toleration to amb tolerating >10 minutes of continuous amb Baseline: 5 minutes Goal status: INITIAL    LONG TERM GOALS: Target date: 11/27/22  Pt to meet stated Foto Goal of 62% Baseline: FOTO risk adjusted 49% Goal status: INITIAL  2.  Pt will be indep with final HEP's (land and aquatic as appropriate) for continued management of condition  Baseline: TBA Goal status: INITIAL  3.  Pt will improve on 5 X STS test to <or=  10s to demonstrate improving functional lower extremity strength, transitional movements, and balance.  Baseline: 17.63 Goal status: INITIAL  4.  Pt will improve left knee strength by 5-10lbs Baseline: see chart Goal status: INITIAL     PLAN:  PT FREQUENCY: 1-2x/week  PT DURATION: 6 weeks  PLANNED INTERVENTIONS: Therapeutic exercises, Therapeutic activity, Neuromuscular re-education, Balance training, Gait training, Patient/Family education, Self Care, Joint mobilization, Stair training, DME instructions, Aquatic Therapy, Dry Needling, Electrical stimulation, Cryotherapy, Moist heat, Splintting, Taping, Ultrasound, Ionotophoresis '4mg'$ /ml Dexamethasone, Manual therapy, and Re-evaluation  PLAN FOR NEXT SESSION: aquatic:  strengthening hip/knee R/L, toleration to amb; gait train; HEP   Denton Meek, PT MPT 10/23/2022, 11:44 AM

## 2022-10-29 ENCOUNTER — Ambulatory Visit (HOSPITAL_BASED_OUTPATIENT_CLINIC_OR_DEPARTMENT_OTHER): Payer: BC Managed Care – PPO | Attending: Orthopaedic Surgery | Admitting: Physical Therapy

## 2022-10-29 DIAGNOSIS — G8929 Other chronic pain: Secondary | ICD-10-CM | POA: Diagnosis not present

## 2022-10-29 DIAGNOSIS — M25562 Pain in left knee: Secondary | ICD-10-CM | POA: Diagnosis not present

## 2022-10-29 DIAGNOSIS — M6281 Muscle weakness (generalized): Secondary | ICD-10-CM | POA: Diagnosis not present

## 2022-10-29 DIAGNOSIS — R262 Difficulty in walking, not elsewhere classified: Secondary | ICD-10-CM | POA: Diagnosis not present

## 2022-10-29 NOTE — Therapy (Signed)
OUTPATIENT PHYSICAL THERAPY LOWER EXTREMITY TREATEMENT   Patient Name: Yvonne [name] MRN: [id number] DOB:09-Jun-1971, 52 y.o., female Today's Date: 10/29/2022  END OF SESSION:  PT End of Session - 10/29/22 1656     Visit Number 3    Number of Visits 12    Date for PT Re-Evaluation 11/27/22    Authorization Type BC[employer]    PT Start Time 1657    PT Stop Time [id number]    PT Time Calculation (min) 43 min    Behavior During Therapy [facility] for tasks assessed/performed              Past Medical History:  Diagnosis Date   Allergy    Depression    Diabetes mellitus without complication ([place])    Fibroid 2005   H/O menorrhagia 01/16/2011   Herpes 2009   HPV in female    Hx: UTI (urinary tract infection) 05/06/2011   Hyperlipidemia    Hypertension    PONV (postoperative nausea and vomiting)    Vulvitis 02/04/2011   Yeast infection 06/19/2010   Past Surgical History:  Procedure Laterality Date   ABDOMINAL HYSTERECTOMY     CESAREAN SECTION  2008 & 2009   COLONOSCOPY  11/24/2019   CRYOTHERAPY  2009   CYSTOSCOPY  06/15/2012   Procedure: CYSTOSCOPY;  Surgeon: Delice Lesch, MD;  Location: [facility];  Service: Gynecology;  Laterality: N/A;   KNEE ARTHROSCOPY Left 05/16/2020   Procedure: LEFT KNEE ARTHROSCOPY, PARTIAL MENISCECTOMY;  Surgeon: Melrose Nakayama, MD;  Location: [facility];  Service: Orthopedics;  Laterality: Left;   KNEE ARTHROSCOPY WITH MEDIAL MENISECTOMY Left 10/13/2020   Procedure: LEFT KNEE ARTHROSCOPY WITH PARTIAL MEDIAL MENISCECTOMY;  Surgeon: Leandrew Koyanagi, MD;  Location: [facility];  Service: Orthopedics;  Laterality: Left;   LAPAROSCOPIC HYSTERECTOMY  06/15/2012   Procedure: HYSTERECTOMY TOTAL LAPAROSCOPIC;  Surgeon: Delice Lesch, MD;  Location: [facility];  Service: Gynecology;  Laterality: N/A;   MYOMECTOMY  2005   TUBAL LIGATION     bilateral   Patient Active Problem List   Diagnosis Date Noted   Pain in left foot 12/24/2021   Prediabetes 12/12/2021    Familial hyperlipidemia 02/06/2021   Chondromalacia, left knee 10/13/2020   Synovitis of knee 10/13/2020   Acute medial meniscal tear, left, initial encounter 09/22/2020   Constipation 06/21/2019   Mixed hyperlipidemia 12/15/2018   Depression 12/15/2018   S/P laparoscopic hysterectomy - 06/15/12 06/23/2012    PCP: Minette Brine, FNP   REFERRING PROVIDER: Vanetta Mulders, MD   REFERRING DIAG: [phone] (ICD-10-CM) - Unilateral primary osteoarthritis, left knee   THERAPY DIAG:  Chronic pain of left knee  Muscle weakness (generalized)  Difficulty in walking, not elsewhere classified  Rationale for Evaluation and Treatment: Rehabilitation  ONSET DATE: 2-3 years  SUBJECTIVE:   SUBJECTIVE STATEMENT: Pt reports minimal soreness after last session.  "I felt great".   PERTINENT HISTORY: Dr Sammuel Hines referred to Dr Elba Barman for PRP PAIN:  Are you having pain? Yes: NPRS scale: 0/10 Pain location: left knee Pain description:  Aggravating factors: walking; standing Relieving factors: sitting; tramadol  PRECAUTIONS: Knee  WEIGHT BEARING RESTRICTIONS: No  FALLS:  Has patient fallen in last 6 months? No  LIVING ENVIRONMENT: Lives with: lives with their family Lives in: House/apartment Stairs: Yes: Internal: 16 steps; on right going up Has following equipment at home: None  OCCUPATION: administration  PLOF: Independent  PATIENT GOALS: I want muscles back and walk and get my weight down  NEXT  MD VISIT: 4 weeks  OBJECTIVE:   DIAGNOSTIC FINDINGS: none in chart  PATIENT SURVEYS:  FOTO risk adjusted 49% with goal of 62% visit 15  COGNITION: Overall cognitive status: Within functional limits for tasks assessed     SENSATION: WFL  EDEMA:  Minimal suprapatellar  MUSCLE LENGTH: Hamstrings:wfl   POSTURE: No Significant postural limitations  PALPATION: TTP medial aspect of knee  LOWER EXTREMITY ROM:  Active ROM Right eval Left eval  Hip flexion    Hip  extension    Hip abduction    Hip adduction    Hip internal rotation    Hip external rotation    Knee flexion 140 110  Knee extension 0 0  Ankle dorsiflexion    Ankle plantarflexion    Ankle inversion    Ankle eversion     (Blank rows = not tested)  LOWER EXTREMITY MMT:  MMT Right eval Left eval  Hip flexion  49.8  Hip extension    Hip abduction  46.5  Hip adduction    Hip internal rotation    Hip external rotation    Knee flexion  47.4  Knee extension    Ankle dorsiflexion    Ankle plantarflexion    Ankle inversion    Ankle eversion     (Blank rows = not tested)   FUNCTIONAL TESTS:  5 times sit to stand: 17.63 Timed up and go (TUG): 10.04  GAIT: Distance walked: 400 ft Assistive device utilized: None Level of assistance: Complete Independence Comments: Normal pattern during assessment   TODAY'S TREATMENT:                                                                                                                              Pt seen for aquatic therapy today.  Treatment took place in water 3.25-4.5 ft in depth at the [place]. Temp of water was 91.  Pt entered/exited the pool via stair using step-to pattern with hand rail.  *Without support: walking forward, back,  and side stepping  * forward walking kicks * holding yellow hand floats:  leg swings in hip flex/ext and into abdct/ add x 10 x 2 each (2nd set with increased speed) * forward step ups with LLE x 10, then runner's step ups on R x 8, then on L x 10 * step down with RLE and retro step up with LLE x 10, UE on rails, cues to not compensate in Lt hip * staggered stance (R foot forward) with STS, feet on blue step and slowly lowering hips to bench x 10 * Quad stretch with foot on bench, opp foot on blue step - 20s x 2 on LLE, x 1 on RLE * straddling noodle with UE on wall:  cycling, cc ski, and hip abdct/add with feet suspended * R/L hamstring stretch with foot on 3rd step  * bilat  calf stretch with heels off of step  Pt requires the buoyancy  and hydrostatic pressure of water for support, and to offload joints by unweighting joint load by at least 50 % in navel deep water and by at least 75-80% in chest to neck deep water.  Viscosity of the water is needed for resistance of strengthening. Water current perturbations provides challenge to standing balance requiring increased core activation.     PATIENT EDUCATION:  Education details: aquatic progressions and modifications Person educated: Patient Education method: Explanation Education comprehension: verbalized understanding  HOME EXERCISE PROGRAM: Aquatic HEP TBA  ASSESSMENT:  CLINICAL IMPRESSION:  Pt given cues for decreased speed and purposeful movement. Pt given cues to even the step height and step length with forward/backward gait in water. She completes session without pain, just "occasional twinge" in Lt knee.  Will try partial split squats at wall next visit and progress stairs and SLS exercises. Will keep her in aquatics for first few weeks to benefit from decreased forces to allow for optimal healing/PRP outcome.  Goals are ongoing.   OBJECTIVE IMPAIRMENTS: Abnormal gait, decreased mobility, difficulty walking, decreased strength, and pain.   ACTIVITY LIMITATIONS: lifting, squatting, stairs, and transfers  PARTICIPATION LIMITATIONS: cleaning, shopping, community activity, and occupation  PERSONAL FACTORS: Time since onset of injury/illness/exacerbation are also affecting patient's functional outcome.   REHAB POTENTIAL: Good  CLINICAL DECISION MAKING: Evolving/moderate complexity  EVALUATION COMPLEXITY: Moderate   GOALS: Goals reviewed with patient? Yes  SHORT TERM GOALS: Target date: 11/06/22 Pt will tolerate full aquatic sessions consistently without increase in pain and with improving function to demonstrate good toleration and effectiveness of intervention.   Baseline: Goal status:  INITIAL  2.  Pt will tolerate stair climbing using alternating pattern ascending and descending 6 steps without use of handrail Baseline:difficult  Goal status: INITIAL  3.  Pt will have improved toleration to amb tolerating >10 minutes of continuous amb Baseline: 5 minutes Goal status: INITIAL    LONG TERM GOALS: Target date: 11/27/22  Pt to meet stated Foto Goal of 62% Baseline: FOTO risk adjusted 49% Goal status: INITIAL  2.  Pt will be indep with final HEP's (land and aquatic as appropriate) for continued management of condition  Baseline: TBA Goal status: INITIAL  3.  Pt will improve on 5 X STS test to <or=  10s to demonstrate improving functional lower extremity strength, transitional movements, and balance.  Baseline: 17.63 Goal status: INITIAL  4.  Pt will improve left knee strength by 5-10lbs Baseline: see chart Goal status: INITIAL     PLAN:  PT FREQUENCY: 1-2x/week  PT DURATION: 6 weeks  PLANNED INTERVENTIONS: Therapeutic exercises, Therapeutic activity, Neuromuscular re-education, Balance training, Gait training, Patient/Family education, Self Care, Joint mobilization, Stair training, DME instructions, Aquatic Therapy, Dry Needling, Electrical stimulation, Cryotherapy, Moist heat, Splintting, Taping, Ultrasound, Ionotophoresis '4mg'$ /ml Dexamethasone, Manual therapy, and Re-evaluation  PLAN FOR NEXT SESSION: aquatic: strengthening hip/knee R/L, toleration to amb; gait train; HEP  Kerin Perna, PTA 10/29/22 5:59 PM [facility] [address] [place], [place], [address] Phone: [phone]   Fax:  [phone]

## 2022-10-30 DIAGNOSIS — M7541 Impingement syndrome of right shoulder: Secondary | ICD-10-CM | POA: Diagnosis not present

## 2022-10-31 ENCOUNTER — Telehealth: Payer: Self-pay

## 2022-10-31 NOTE — Telephone Encounter (Signed)
Per pharmacy [employer] requires PA.  PA started via [facility]: [id number]

## 2022-10-31 NOTE — Telephone Encounter (Signed)
PA APPROVED Approved. . Authorization Expiration Date: October 31, 2023.

## 2022-11-04 ENCOUNTER — Encounter: Payer: Self-pay | Admitting: Hematology & Oncology

## 2022-11-04 ENCOUNTER — Inpatient Hospital Stay (HOSPITAL_BASED_OUTPATIENT_CLINIC_OR_DEPARTMENT_OTHER): Payer: BC Managed Care – PPO | Admitting: Hematology & Oncology

## 2022-11-04 ENCOUNTER — Other Ambulatory Visit: Payer: Self-pay

## 2022-11-04 ENCOUNTER — Inpatient Hospital Stay: Payer: BC Managed Care – PPO | Attending: Hematology & Oncology

## 2022-11-04 VITALS — BP 145/86 | HR 70 | Temp 98.1°F | Resp 18 | Ht 65.0 in | Wt 168.8 lb

## 2022-11-04 DIAGNOSIS — Z882 Allergy status to sulfonamides status: Secondary | ICD-10-CM

## 2022-11-04 DIAGNOSIS — Z888 Allergy status to other drugs, medicaments and biological substances status: Secondary | ICD-10-CM | POA: Insufficient documentation

## 2022-11-04 DIAGNOSIS — E782 Mixed hyperlipidemia: Secondary | ICD-10-CM

## 2022-11-04 DIAGNOSIS — R748 Abnormal levels of other serum enzymes: Secondary | ICD-10-CM | POA: Diagnosis not present

## 2022-11-04 DIAGNOSIS — R911 Solitary pulmonary nodule: Secondary | ICD-10-CM | POA: Diagnosis not present

## 2022-11-04 DIAGNOSIS — M659 Synovitis and tenosynovitis, unspecified: Secondary | ICD-10-CM

## 2022-11-04 DIAGNOSIS — R7989 Other specified abnormal findings of blood chemistry: Secondary | ICD-10-CM | POA: Insufficient documentation

## 2022-11-04 DIAGNOSIS — Z79899 Other long term (current) drug therapy: Secondary | ICD-10-CM | POA: Insufficient documentation

## 2022-11-04 DIAGNOSIS — M65969 Unspecified synovitis and tenosynovitis, unspecified lower leg: Secondary | ICD-10-CM

## 2022-11-04 LAB — CMP (CANCER CENTER ONLY)
ALT: 24 U/L (ref 0–44)
AST: 16 U/L (ref 15–41)
Albumin: 4.9 g/dL (ref 3.5–5.0)
Alkaline Phosphatase: 71 U/L (ref 38–126)
Anion gap: 9 (ref 5–15)
BUN: 13 mg/dL (ref 6–20)
CO2: 31 mmol/L (ref 22–32)
Calcium: 10.6 mg/dL — ABNORMAL HIGH (ref 8.9–10.3)
Chloride: 103 mmol/L (ref 98–111)
Creatinine: 0.82 mg/dL (ref 0.44–1.00)
GFR, Estimated: >60 mL/min (ref >60)
Glucose, Bld: 124 mg/dL — ABNORMAL HIGH (ref 70–99)
Potassium: 5.1 mmol/L (ref 3.5–5.1)
Sodium: 143 mmol/L (ref 135–145)
Total Bilirubin: 0.6 mg/dL (ref 0.3–1.2)
Total Protein: 7.5 g/dL (ref 6.5–8.1)

## 2022-11-04 LAB — RETICULOCYTES
Immature Retic Fract: 7.9 % (ref 2.3–15.9)
RBC.: 4.86 MIL/uL (ref 3.87–5.11)
Retic Count, Absolute: 76.3 10*3/uL (ref 19.0–186.0)
Retic Ct Pct: 1.6 % (ref 0.4–3.1)

## 2022-11-04 LAB — VITAMIN B12: Vitamin B-12: 1015 pg/mL — ABNORMAL HIGH (ref 180–914)

## 2022-11-04 LAB — CBC WITH DIFFERENTIAL (CANCER CENTER ONLY)
Abs Immature Granulocytes: 0.03 10*3/uL (ref 0.00–0.07)
Basophils Absolute: 0 10*3/uL (ref 0.0–0.1)
Basophils Relative: 0 %
Eosinophils Absolute: 0 10*3/uL (ref 0.0–0.5)
Eosinophils Relative: 0 %
HCT: 43.6 % (ref 36.0–46.0)
Hemoglobin: 14.8 g/dL (ref 12.0–15.0)
Immature Granulocytes: 1 %
Lymphocytes Relative: 30 %
Lymphs Abs: 1.6 10*3/uL (ref 0.7–4.0)
MCH: 30.3 pg (ref 26.0–34.0)
MCHC: 33.9 g/dL (ref 30.0–36.0)
MCV: 89.2 fL (ref 80.0–100.0)
Monocytes Absolute: 0.4 10*3/uL (ref 0.1–1.0)
Monocytes Relative: 7 %
Neutro Abs: 3.4 10*3/uL (ref 1.7–7.7)
Neutrophils Relative %: 62 %
Platelet Count: 296 10*3/uL (ref 150–400)
RBC: 4.89 MIL/uL (ref 3.87–5.11)
RDW: 12.4 % (ref 11.5–15.5)
WBC Count: 5.5 10*3/uL (ref 4.0–10.5)
nRBC: 0 % (ref 0.0–0.2)

## 2022-11-04 LAB — SAVE SMEAR(SSMR), FOR PROVIDER SLIDE REVIEW

## 2022-11-04 LAB — LACTATE DEHYDROGENASE: LDH: 185 U/L (ref 98–192)

## 2022-11-04 NOTE — Progress Notes (Signed)
Hematology and Oncology Follow Up Visit  Yvonne [name] [id number] 1970/12/13 52 y.o. 11/04/2022   Principle Diagnosis:  Elevated vitamin B12 level  Current Therapy:   Observation     Interim History:  Yvonne [name] is back for a long awaited visit.  We have seen her previously.  I think we last saw her back in 2019.  At that time, she had the same problem with an elevated vitamin B12 level.  She is not taking any supplements.  She is working.  She has had no problems with fever.  She did have COVID several years ago.  When she saw her family doctor, her vitamin B12 level was 1873 pg/mL.  She did not have any problems with anemia.  There is no thrombocytosis or leukocytosis.  She has had no rashes.  There is been no swollen lymph nodes.  She has had no change in bowel or bladder habits.  She is up-to-date with her mammograms.  She is also up-to-date with her colonoscopy.  She has had no weight loss weight gain.  She is not a vegetarian.  She does not smoke.  I think she rarely, if any, has any adult beverage.  Currently, I would have said that her performance status is probably ECOG 0.  Medications:  Current Outpatient Medications:    Adapalene 0.3 % gel, Apply 1 application topically as needed (zits). , Disp: , Rfl:    atorvastatin (LIPITOR) 40 MG tablet, TAKE 1 TABLET (40 MG TOTAL) BY MOUTH DAILY. PLEASE KEEP SCHEDULED APPOINTMENT, Disp: 90 tablet, Rfl: 3   cyclobenzaprine (FLEXERIL) 10 MG tablet, Take 10 mg by mouth 3 (three) times daily as needed., Disp: , Rfl:    Evolocumab (REPATHA SURECLICK) [id number] MG/ML SOAJ, Inject 1 Dose into the skin every 14 (fourteen) days., Disp: 6 mL, Rfl: 3   ipratropium (ATROVENT) 0.03 % nasal spray, PLACE 1 SPRAY INTO BOTH NOSTRILS DAILY, Disp: 30 mL, Rfl: 1   Melatonin 5 MG CHEW, Chew by mouth as needed., Disp: , Rfl:    Multiple Vitamins-Minerals (CENTRUM ADULTS PO), Take 1 tablet by mouth daily., Disp: , Rfl:    Omega-3 Fatty Acids (FISH OIL PO),  Take 1 capsule by mouth daily., Disp: , Rfl:    omeprazole (PRILOSEC) 40 MG capsule, Take 1 capsule (40 mg total) by mouth daily. (Patient taking differently: Take 20 mg by mouth as needed (indigestion or heartburn).), Disp: 90 capsule, Rfl: 3   PARoxetine (PAXIL) 10 MG tablet, Take 1 tablet (10 mg total) by mouth daily., Disp: 90 tablet, Rfl: 1   Semaglutide, 1 MG/DOSE, (OZEMPIC, 1 MG/DOSE,) 4 MG/3ML SOPN, INJECT '1MG'$  INTO THE SKIN ONCE A WEEK, Disp: 9 mL, Rfl: 1   Sennosides (SENNA) 8.6 MG CAPS, Take 1 capsule by mouth daily as needed., Disp: , Rfl:    traMADol (ULTRAM) 50 MG tablet, Take 1-2 tablets (50-100 mg total) by mouth every 6 (six) hours as needed for severe pain., Disp: 20 tablet, Rfl: 0   valACYclovir (VALTREX) 1000 MG tablet, TAKE 1 TABLET BY MOUTH EVERY 12 HOURS (Patient taking differently: Take 1,000 mg by mouth as needed (cold sore outbreak).), Disp: 180 tablet, Rfl: 1  Allergies:  Allergies  Allergen Reactions   Statins Other (See Comments)    myalgias   Sulfa Antibiotics Itching    Past Medical History, Surgical history, Social history, and Family History were reviewed and updated.  Review of Systems: Review of Systems  Constitutional: Negative.   HENT:  Negative.  Eyes: Negative.   Respiratory: Negative.    Cardiovascular: Negative.   Gastrointestinal: Negative.   Endocrine: Negative.   Genitourinary: Negative.    Musculoskeletal: Negative.   Skin: Negative.   Neurological: Negative.   Hematological: Negative.   Psychiatric/Behavioral: Negative.      Physical Exam:  height is '5\' 5"'$  (1.651 m) and weight is 168 lb 12.8 oz (76.6 kg). Her oral temperature is 98.1 F (36.7 C). Her blood pressure is 145/86 (abnormal) and her pulse is 70. Her respiration is 18 and oxygen saturation is 100%.   Wt Readings from Last 3 Encounters:  11/04/22 168 lb 12.8 oz (76.6 kg)  10/02/22 168 lb (76.2 kg)  09/30/22 168 lb 6.4 oz (76.4 kg)    Physical Exam Vitals reviewed.   HENT:     Head: Normocephalic and atraumatic.  Eyes:     Pupils: Pupils are equal, round, and reactive to light.  Cardiovascular:     Rate and Rhythm: Normal rate and regular rhythm.     Heart sounds: Normal heart sounds.  Pulmonary:     Effort: Pulmonary effort is normal.     Breath sounds: Normal breath sounds.  Abdominal:     General: Bowel sounds are normal.     Palpations: Abdomen is soft.  Musculoskeletal:        General: No tenderness or deformity. Normal range of motion.     Cervical back: Normal range of motion.  Lymphadenopathy:     Cervical: No cervical adenopathy.  Skin:    General: Skin is warm and dry.     Findings: No erythema or rash.  Neurological:     Mental Status: She is alert and oriented to person, place, and time.  Psychiatric:        Behavior: Behavior normal.        Thought Content: Thought content normal.        Judgment: Judgment normal.      Lab Results  Component Value Date   WBC 5.5 11/04/2022   HGB 14.8 11/04/2022   HCT 43.6 11/04/2022   MCV 89.2 11/04/2022   PLT 296 11/04/2022     Chemistry      Component Value Date/Time   NA 143 11/04/2022 1404   NA 140 10/02/2022 1210   NA 141 07/30/2017 1200   K 5.1 11/04/2022 1404   K 4.4 07/30/2017 1200   CL 103 11/04/2022 1404   CO2 31 11/04/2022 1404   CO2 25 07/30/2017 1200   BUN 13 11/04/2022 1404   BUN 12 10/02/2022 1210   BUN 9.3 07/30/2017 1200   CREATININE 0.82 11/04/2022 1404   CREATININE 0.7 07/30/2017 1200      Component Value Date/Time   CALCIUM 10.6 (H) 11/04/2022 1404   CALCIUM 9.2 07/30/2017 1200   ALKPHOS 71 11/04/2022 1404   ALKPHOS 53 07/30/2017 1200   AST 16 11/04/2022 1404   AST 33 07/30/2017 1200   ALT 24 11/04/2022 1404   ALT 51 07/30/2017 1200   BILITOT 0.6 11/04/2022 1404   BILITOT 0.65 07/30/2017 1200     Her peripheral blood smear shows a normochromic and normocytic population of red blood cells.  There are no nucleated red blood cells.  I see no  teardrop cells.  There is no rouleaux formation.  I see no schistocytes or spherocytes.  I see no sickle cells.  White blood cells appear normal in morphology maturation.  I do not see any immature myeloid or lymphoid cells.  Platelets  are adequate in number and size.  I do not see any large platelets.  Platelets are well granulated.   Impression and Plan: Yvonne [name] is a very charming 52 year old African-American female.  She has a persistently elevated vitamin B12 level.  I really do not see that this is going to be a problem for her.  However, I think would be worthwhile checking her for an underlying myeloproliferative disorder.  We know her blood count is doing okay, I think we worth while checking her for a JAK2 panel.  This is 1 situation where the vitamin B12 can be elevated.  Her blood smear certainly was unremarkable.  Her physical exam was unremarkable.  I told her that if the JAK2 panel is positive for a mutation, that she will clearly need to have a bone marrow biopsy done.  I tried to reassure her that having a elevated B12 level is not going to hurt her.  It is not an indicator of any kind of malignancy.  I really do not think that her B12 level is all that elevated.  I would call it mildly elevated.  Hopefully, I just reassured Yvonne [name].  We will see what the JAK2 panel is.  If the JAK2 panel is normal, then I do still think we have to see her back.  I am just not sure we will be adding much to her medical care.   [name], MD 3/11/20245:21 PM

## 2022-11-06 ENCOUNTER — Encounter (HOSPITAL_BASED_OUTPATIENT_CLINIC_OR_DEPARTMENT_OTHER): Payer: Self-pay | Admitting: Physical Therapy

## 2022-11-06 ENCOUNTER — Ambulatory Visit (HOSPITAL_BASED_OUTPATIENT_CLINIC_OR_DEPARTMENT_OTHER): Payer: BC Managed Care – PPO | Admitting: Physical Therapy

## 2022-11-06 DIAGNOSIS — M25562 Pain in left knee: Secondary | ICD-10-CM | POA: Diagnosis not present

## 2022-11-06 DIAGNOSIS — R262 Difficulty in walking, not elsewhere classified: Secondary | ICD-10-CM

## 2022-11-06 DIAGNOSIS — M6281 Muscle weakness (generalized): Secondary | ICD-10-CM | POA: Diagnosis not present

## 2022-11-06 DIAGNOSIS — G8929 Other chronic pain: Secondary | ICD-10-CM

## 2022-11-06 LAB — FOLATE RBC
Folate, Hemolysate: 479 ng/mL
Folate, RBC: 1089 ng/mL (ref >498)
Hematocrit: 44 % (ref 34.0–46.6)

## 2022-11-06 NOTE — Therapy (Signed)
OUTPATIENT PHYSICAL THERAPY LOWER EXTREMITY TREATEMENT   Patient Name: Yvonne [name] MRN: [id number] DOB:Dec 26, 1970, 52 y.o., female Today's Date: 11/06/2022  END OF SESSION:  PT End of Session - 11/06/22 1208     Visit Number 4    Number of Visits 12    Date for PT Re-Evaluation 11/27/22    Authorization Type BCBS    PT Start Time 1200    PT Stop Time 1240    PT Time Calculation (min) 40 min    Activity Tolerance Patient tolerated treatment well    Behavior During Therapy [facility] for tasks assessed/performed              Past Medical History:  Diagnosis Date   Allergy    Depression    Diabetes mellitus without complication ([place])    Fibroid 2005   H/O menorrhagia 01/16/2011   Herpes 2009   HPV in female    Hx: UTI (urinary tract infection) 05/06/2011   Hyperlipidemia    Hypertension    PONV (postoperative nausea and vomiting)    Vulvitis 02/04/2011   Yeast infection 06/19/2010   Past Surgical History:  Procedure Laterality Date   ABDOMINAL HYSTERECTOMY     CESAREAN SECTION  2008 & 2009   COLONOSCOPY  11/24/2019   CRYOTHERAPY  2009   CYSTOSCOPY  06/15/2012   Procedure: CYSTOSCOPY;  Surgeon: Delice Lesch, MD;  Location: [facility];  Service: Gynecology;  Laterality: N/A;   KNEE ARTHROSCOPY Left 05/16/2020   Procedure: LEFT KNEE ARTHROSCOPY, PARTIAL MENISCECTOMY;  Surgeon: Melrose Nakayama, MD;  Location: [facility];  Service: Orthopedics;  Laterality: Left;   KNEE ARTHROSCOPY WITH MEDIAL MENISECTOMY Left 10/13/2020   Procedure: LEFT KNEE ARTHROSCOPY WITH PARTIAL MEDIAL MENISCECTOMY;  Surgeon: Leandrew Koyanagi, MD;  Location: [place];  Service: Orthopedics;  Laterality: Left;   LAPAROSCOPIC HYSTERECTOMY  06/15/2012   Procedure: HYSTERECTOMY TOTAL LAPAROSCOPIC;  Surgeon: Delice Lesch, MD;  Location: [facility];  Service: Gynecology;  Laterality: N/A;   MYOMECTOMY  2005   TUBAL LIGATION     bilateral   Patient Active Problem List   Diagnosis Date Noted    Pain in left foot 12/24/2021   Prediabetes 12/12/2021   Familial hyperlipidemia 02/06/2021   Chondromalacia, left knee 10/13/2020   Synovitis of knee 10/13/2020   Acute medial meniscal tear, left, initial encounter 09/22/2020   Constipation 06/21/2019   Mixed hyperlipidemia 12/15/2018   Depression 12/15/2018   S/P laparoscopic hysterectomy - 06/15/12 06/23/2012    PCP: Minette Brine, FNP   REFERRING PROVIDER: Vanetta Mulders, MD   REFERRING DIAG: [phone] (ICD-10-CM) - Unilateral primary osteoarthritis, left knee   THERAPY DIAG:  Chronic pain of left knee  Muscle weakness (generalized)  Difficulty in walking, not elsewhere classified  Rationale for Evaluation and Treatment: Rehabilitation  ONSET DATE: 2-3 years  SUBJECTIVE:   SUBJECTIVE STATEMENT: Pt reports she has been going up/down stairs (with reciprocal pattern vs step-to), but still feels her hip compensating when LLE going down step.  She reports she has been up and moving more, so she has been wearing her knee brace more.  She reports she adjusted her stationary bike seat and was able to ride without any pain.    PERTINENT HISTORY: Dr Sammuel Hines referred to Dr Elba Barman for PRP PAIN:  Are you having pain? Yes: NPRS scale: 1-2/10 when WB, 0/10 at rest  Pain location: left knee Pain description: sharp / stabbing with WB Aggravating factors: walking; standing Relieving factors: sitting;  tramadol  PRECAUTIONS: Knee  WEIGHT BEARING RESTRICTIONS: No  FALLS:  Has patient fallen in last 6 months? No  LIVING ENVIRONMENT: Lives with: lives with their family Lives in: House/apartment Stairs: Yes: Internal: 16 steps; on right going up Has following equipment at home: None  OCCUPATION: administration  PLOF: Independent  PATIENT GOALS: I want muscles back and walk and get my weight down  NEXT MD VISIT: 4 weeks  OBJECTIVE:   DIAGNOSTIC FINDINGS: none in chart  PATIENT SURVEYS:  FOTO risk adjusted 49% with  goal of 62% visit 15  COGNITION: Overall cognitive status: Within functional limits for tasks assessed     SENSATION: WFL  EDEMA:  Minimal suprapatellar  MUSCLE LENGTH: Hamstrings:wfl   POSTURE: No Significant postural limitations  PALPATION: TTP medial aspect of knee  LOWER EXTREMITY ROM:  Active ROM Right eval Left eval  Hip flexion    Hip extension    Hip abduction    Hip adduction    Hip internal rotation    Hip external rotation    Knee flexion 140 110  Knee extension 0 0  Ankle dorsiflexion    Ankle plantarflexion    Ankle inversion    Ankle eversion     (Blank rows = not tested)  LOWER EXTREMITY MMT:  MMT Right eval Left eval  Hip flexion  49.8  Hip extension    Hip abduction  46.5  Hip adduction    Hip internal rotation    Hip external rotation    Knee flexion  47.4  Knee extension    Ankle dorsiflexion    Ankle plantarflexion    Ankle inversion    Ankle eversion     (Blank rows = not tested)   FUNCTIONAL TESTS:  5 times sit to stand: 17.63 Timed up and go (TUG): 10.04  GAIT: Distance walked: 400 ft Assistive device utilized: None Level of assistance: Complete Independence Comments: Normal pattern during assessment   TODAY'S TREATMENT:                                                                                                                              Pt seen for aquatic therapy today.  Treatment took place in water 3.25-4.5 ft in depth at the [facility]. Temp of water was 91.  Pt entered/exited the pool via stair using step-through pattern with heavy UE on hand rail.  *Without support: walking forward, back,  and side stepping  * forward walking kicks * holding yellow hand floats:  leg swings in hip flex/ext and into abdct/ add x 10 x 2 each (2nd set with increased speed) * split squats at wall * walking split squats forward/backward  * reciprocal pattern up, no rail, with rail going down * runners step up,  alternating * forward step ups on 2nd step, slow retro step down, with UE on rails * step down with RLE and retro step up with LLE x 10, UE  on rails, (improved) * return to walking forward / backward with increased speed ->  light jog forward/ backward in 4 ft depth * quad stretch with solid noodle (too much stretch), repeated holding ankle with improved tolerance   Pt requires the buoyancy and hydrostatic pressure of water for support, and to offload joints by unweighting joint load by at least 50 % in navel deep water and by at least 75-80% in chest to neck deep water.  Viscosity of the water is needed for resistance of strengthening. Water current perturbations provides challenge to standing balance requiring increased core activation.     PATIENT EDUCATION:  Education details: aquatic progressions and modifications Person educated: Patient Education method: Explanation Education comprehension: verbalized understanding  HOME EXERCISE PROGRAM: Aquatic HEP TBA  ASSESSMENT:  CLINICAL IMPRESSION:   Pt given minor cues initially to even the step length with forward/backward gait in water. She tolerated split squats, wide squats and stairs without any increase in pain. Pt has partially met her STG. Pt transitions to land visits at next appt.     OBJECTIVE IMPAIRMENTS: Abnormal gait, decreased mobility, difficulty walking, decreased strength, and pain.   ACTIVITY LIMITATIONS: lifting, squatting, stairs, and transfers  PARTICIPATION LIMITATIONS: cleaning, shopping, community activity, and occupation  PERSONAL FACTORS: Time since onset of injury/illness/exacerbation are also affecting patient's functional outcome.   REHAB POTENTIAL: Good  CLINICAL DECISION MAKING: Evolving/moderate complexity  EVALUATION COMPLEXITY: Moderate   GOALS: Goals reviewed with patient? Yes  SHORT TERM GOALS: Target date: 11/06/22 Pt will tolerate full aquatic sessions consistently without increase in  pain and with improving function to demonstrate good toleration and effectiveness of intervention.   Baseline: Goal status: MET - 11/06/22  2.  Pt will tolerate stair climbing using alternating pattern ascending and descending 6 steps without use of handrail Baseline:difficult at eval.  Using reciprocal pattern, but needs rail with descending. Goal status:Partially met - 11/06/22  3.  Pt will have improved toleration to amb tolerating >10 minutes of continuous amb Baseline: 5 minutes at eval Goal status: MET -11/06/22    LONG TERM GOALS: Target date: 11/27/22  Pt to meet stated Foto Goal of 62% Baseline: FOTO risk adjusted 49% Goal status: INITIAL  2.  Pt will be indep with final HEP's (land and aquatic as appropriate) for continued management of condition  Baseline: TBA Goal status: INITIAL  3.  Pt will improve on 5 X STS test to <or=  10s to demonstrate improving functional lower extremity strength, transitional movements, and balance.  Baseline: 17.63 Goal status: INITIAL  4.  Pt will improve left knee strength by 5-10lbs Baseline: see chart Goal status: INITIAL     PLAN:  PT FREQUENCY: 1-2x/week  PT DURATION: 6 weeks  PLANNED INTERVENTIONS: Therapeutic exercises, Therapeutic activity, Neuromuscular re-education, Balance training, Gait training, Patient/Family education, Self Care, Joint mobilization, Stair training, DME instructions, Aquatic Therapy, Dry Needling, Electrical stimulation, Cryotherapy, Moist heat, Splintting, Taping, Ultrasound, Ionotophoresis '4mg'$ /ml Dexamethasone, Manual therapy, and Re-evaluation  PLAN FOR NEXT SESSION: strengthening hip/knee R/L,  stairs; create HEP  Kerin Perna, PTA 11/06/22 1:46 PM [facility] [address] [place], [place], [address] Phone: [phone]   Fax:  [phone]

## 2022-11-11 ENCOUNTER — Encounter: Payer: Self-pay | Admitting: Nurse Practitioner

## 2022-11-11 DIAGNOSIS — E119 Type 2 diabetes mellitus without complications: Secondary | ICD-10-CM | POA: Insufficient documentation

## 2022-11-14 LAB — HEMOCHROMATOSIS DNA-PCR(C282Y,H63D)

## 2022-11-14 LAB — JAK2 (INCLUDING V617F AND EXON 12), MPL,& CALR-NEXT GEN SEQ

## 2022-11-15 ENCOUNTER — Encounter: Payer: Self-pay | Admitting: Sports Medicine

## 2022-11-15 ENCOUNTER — Ambulatory Visit: Payer: BC Managed Care – PPO | Admitting: Sports Medicine

## 2022-11-15 DIAGNOSIS — M1712 Unilateral primary osteoarthritis, left knee: Secondary | ICD-10-CM | POA: Diagnosis not present

## 2022-11-15 DIAGNOSIS — M25562 Pain in left knee: Secondary | ICD-10-CM | POA: Diagnosis not present

## 2022-11-15 DIAGNOSIS — G8929 Other chronic pain: Secondary | ICD-10-CM

## 2022-11-15 NOTE — Progress Notes (Signed)
Yvonne [name] - 52 y.o. female MRN [id number]  Date of birth: 1971-07-17  Office Visit Note: Visit Date: 11/15/2022 PCP: Minette Brine, FNP Referred by: Minette Brine, FNP  Subjective: Chief Complaint  Patient presents with   Left Knee - Follow-up   HPI: Yvonne [name] is a pleasant 52 y.o. female who presents today for follow-up of chronic left knee pain.  Yvonne [name] has had ongoing left knee pain for 3 years, she did undergo arthroscopic debridement of the meniscus in 2022 initially with Dr. Rhona Raider, then subsequently with Dr. Erlinda Hong.  During her second procedure she did have microfracture as well.  She states the second procedure with Dr. Erlinda Hong did Improve her pain significantly, however it returned after few months.   We did perform PRP injection therapy on 09/17/2022.  She was very pleased with that like she got excellent relief for the first 6-8 weeks, she is about 2 months out.  Recently she has been noticing some stiffness in the knee after sitting for longer period of time.  Does note some crunching of the kneecap.  She has been doing aquatic-based therapy at there is location.  She is now getting ready to start land physical therapy next week.  She is not taking any medication for pain.  Her pain is drastically improved, still is having some stiffness.  Denies any significant swelling anymore at the knee.  Pertinent ROS were reviewed with the patient and found to be negative unless otherwise specified above in HPI.   Assessment & Plan: Visit Diagnoses:  1. Unilateral primary osteoarthritis, left knee   2. Chronic pain of left knee    Plan: Discussed with [place] treatment options for her knee pain.  It is reassuring she has a good response to cryotherapy injection.  Discussed with her today she still has prolonged effects with the PRP will provide over the next few months.  She is getting ready to get started in physical therapy, although she was asking about the home exercises she can do  in the interim.  I did provide a customized handout and did review these in the room with her today to perform once daily.  She may continue her custom knee brace with more vigorous activity, I would like her to get back into the body helix neoprene compression knee sleeve when she is off and walking, curious if this will help improve her stiffness throughout the day.  She will take this off at night.  May use ice if any swelling.  We did discuss the role somewhere between 3-102-month mark if she would like to proceed with an additional PRP injection for further augmentation of her improvement, she will think about this.  We will follow-up with me the end of her land-based physical therapy for reevaluation.  Follow-up: Return for at the end of her PT for L-knee.   Meds & Orders: No orders of the defined types were placed in this encounter.  No orders of the defined types were placed in this encounter.    Procedures: No procedures performed      Clinical History: No specialty comments available.  She reports that she has never smoked. She has never used smokeless tobacco.  Recent Labs    12/12/21 1225 06/18/22 1150 10/02/22 1210  HGBA1C 5.6 5.6 5.7*    Objective:   Vital Signs: LMP 05/25/2012   Physical Exam  Gen: Well-appearing, in no acute distress; non-toxic CV: Well-perfused. Warm.  Resp: Breathing unlabored on room air; no wheezing.  Psych: Fluid speech in conversation; appropriate affect; normal thought process Neuro: Sensation intact throughout. No gross coordination deficits.   Ortho Exam - Left knee: There is a trace effusion noted of the left knee without warmth or redness.  Range of motion 0-130 degrees.  Ligamentously intact.  There are some mild crepitus with knee flexion and extension.  Neurovascular intact distally.  Imaging: N/a   Past Medical/Family/Surgical/Social History: Medications & Allergies reviewed per EMR, new medications updated. Patient Active Problem  List   Diagnosis Date Noted   Type 2 diabetes mellitus ([place]) 11/11/2022   Pain in left foot 12/24/2021   Familial hyperlipidemia 02/06/2021   Chondromalacia, left knee 10/13/2020   Synovitis of knee 10/13/2020   Acute medial meniscal tear, left, initial encounter 09/22/2020   Constipation 06/21/2019   Mixed hyperlipidemia 12/15/2018   Depression 12/15/2018   S/P laparoscopic hysterectomy - 06/15/12 06/23/2012   Past Medical History:  Diagnosis Date   Allergy    Depression    Diabetes mellitus without complication ([place])    Fibroid 2005   H/O menorrhagia 01/16/2011   Herpes 2009   HPV in female    Hx: UTI (urinary tract infection) 05/06/2011   Hyperlipidemia    Hypertension    PONV (postoperative nausea and vomiting)    Vulvitis 02/04/2011   Yeast infection 06/19/2010   Family History  Problem Relation Age of Onset   Hypertension Mother    Heart disease Mother    Diabetes Father    Alcohol abuse Father    Diabetes Brother    Hypertension Brother    Alcohol abuse Brother    Heart attack Maternal Grandfather    Stomach cancer Maternal Grandfather    Hypertension Brother    Crohn's disease Other        mat 2nd cousin   Hyperlipidemia Other    Irritable bowel syndrome Other        mat. nephew   Colon cancer Neg Hx    Breast cancer Neg Hx    Colon polyps Neg Hx    Esophageal cancer Neg Hx    Rectal cancer Neg Hx    Past Surgical History:  Procedure Laterality Date   ABDOMINAL HYSTERECTOMY     CESAREAN SECTION  2008 & 2009   COLONOSCOPY  11/24/2019   CRYOTHERAPY  2009   CYSTOSCOPY  06/15/2012   Procedure: CYSTOSCOPY;  Surgeon: Delice Lesch, MD;  Location: [facility];  Service: Gynecology;  Laterality: N/A;   KNEE ARTHROSCOPY Left 05/16/2020   Procedure: LEFT KNEE ARTHROSCOPY, PARTIAL MENISCECTOMY;  Surgeon: Melrose Nakayama, MD;  Location: [facility];  Service: Orthopedics;  Laterality: Left;   KNEE ARTHROSCOPY WITH MEDIAL MENISECTOMY Left 10/13/2020   Procedure: LEFT  KNEE ARTHROSCOPY WITH PARTIAL MEDIAL MENISCECTOMY;  Surgeon: Leandrew Koyanagi, MD;  Location: [facility];  Service: Orthopedics;  Laterality: Left;   LAPAROSCOPIC HYSTERECTOMY  06/15/2012   Procedure: HYSTERECTOMY TOTAL LAPAROSCOPIC;  Surgeon: Delice Lesch, MD;  Location: [facility];  Service: Gynecology;  Laterality: N/A;   MYOMECTOMY  2005   TUBAL LIGATION     bilateral   Social History   Occupational History   Occupation: homemaker    Employer: [employer]  Tobacco Use   Smoking status: Never   Smokeless tobacco: Never  Vaping Use   Vaping Use: Never used  Substance and Sexual Activity   Alcohol use: Yes    Alcohol/week: 6.0 standard drinks of alcohol    Types: 6 Glasses of  wine per week    Comment: wine daily   Drug use: No   Sexual activity: Yes    Birth control/protection: Surgical    Comment: BTL   Patient was instructed in 10 minutes of therapeutic exercises for the left knee to improve strength, ROM and function according to my instructions and plan of care by myself during the office visit. A customized handout was provided and demonstration of proper technique shown and discussed. Patient did perform exercises and demonstrate understanding through teachback.  All questions discussed and answered.  Elba Barman, DO Primary Care Sports Medicine Physician  [place]  This note was dictated using Dragon naturally speaking software and may contain errors in syntax, spelling, or content which have not been identified prior to signing this note.

## 2022-11-18 ENCOUNTER — Other Ambulatory Visit: Payer: Self-pay | Admitting: Nurse Practitioner

## 2022-11-18 DIAGNOSIS — E1169 Type 2 diabetes mellitus with other specified complication: Secondary | ICD-10-CM

## 2022-11-18 MED ORDER — OZEMPIC (2 MG/DOSE) 8 MG/3ML ~~LOC~~ SOPN: 2.0000 mg | PEN_INJECTOR | SUBCUTANEOUS | 1 refills | Status: DC

## 2022-11-19 ENCOUNTER — Ambulatory Visit (HOSPITAL_BASED_OUTPATIENT_CLINIC_OR_DEPARTMENT_OTHER): Payer: BC Managed Care – PPO

## 2022-11-19 ENCOUNTER — Encounter (HOSPITAL_BASED_OUTPATIENT_CLINIC_OR_DEPARTMENT_OTHER): Payer: Self-pay

## 2022-11-19 DIAGNOSIS — M25562 Pain in left knee: Secondary | ICD-10-CM | POA: Diagnosis not present

## 2022-11-19 DIAGNOSIS — M6281 Muscle weakness (generalized): Secondary | ICD-10-CM

## 2022-11-19 DIAGNOSIS — R262 Difficulty in walking, not elsewhere classified: Secondary | ICD-10-CM | POA: Diagnosis not present

## 2022-11-19 DIAGNOSIS — G8929 Other chronic pain: Secondary | ICD-10-CM | POA: Diagnosis not present

## 2022-11-19 NOTE — Therapy (Signed)
OUTPATIENT PHYSICAL THERAPY LOWER EXTREMITY TREATEMENT   Patient Name: Yvonne [name] MRN: [id number] DOB:1971-02-09, 52 y.o., female Today's Date: 11/19/2022  END OF SESSION:  PT End of Session - 11/19/22 1153     Visit Number 5    Number of Visits 12    Date for PT Re-Evaluation 11/27/22    Authorization Type BCBS    PT Start Time 1152    PT Stop Time 1230    PT Time Calculation (min) 38 min    Activity Tolerance Patient tolerated treatment well    Behavior During Therapy [facility] for tasks assessed/performed               Past Medical History:  Diagnosis Date   Allergy    Depression    Diabetes mellitus without complication ([place])    Fibroid 2005   H/O menorrhagia 01/16/2011   Herpes 2009   HPV in female    Hx: UTI (urinary tract infection) 05/06/2011   Hyperlipidemia    Hypertension    PONV (postoperative nausea and vomiting)    Vulvitis 02/04/2011   Yeast infection 06/19/2010   Past Surgical History:  Procedure Laterality Date   ABDOMINAL HYSTERECTOMY     CESAREAN SECTION  2008 & 2009   COLONOSCOPY  11/24/2019   CRYOTHERAPY  2009   CYSTOSCOPY  06/15/2012   Procedure: CYSTOSCOPY;  Surgeon: Delice Lesch, MD;  Location: [facility];  Service: Gynecology;  Laterality: N/A;   KNEE ARTHROSCOPY Left 05/16/2020   Procedure: LEFT KNEE ARTHROSCOPY, PARTIAL MENISCECTOMY;  Surgeon: Melrose Nakayama, MD;  Location: [facility];  Service: Orthopedics;  Laterality: Left;   KNEE ARTHROSCOPY WITH MEDIAL MENISECTOMY Left 10/13/2020   Procedure: LEFT KNEE ARTHROSCOPY WITH PARTIAL MEDIAL MENISCECTOMY;  Surgeon: Leandrew Koyanagi, MD;  Location: [facility];  Service: Orthopedics;  Laterality: Left;   LAPAROSCOPIC HYSTERECTOMY  06/15/2012   Procedure: HYSTERECTOMY TOTAL LAPAROSCOPIC;  Surgeon: Delice Lesch, MD;  Location: [facility];  Service: Gynecology;  Laterality: N/A;   MYOMECTOMY  2005   TUBAL LIGATION     bilateral   Patient Active Problem List   Diagnosis Date Noted    Type 2 diabetes mellitus ([place]) 11/11/2022   Pain in left foot 12/24/2021   Familial hyperlipidemia 02/06/2021   Chondromalacia, left knee 10/13/2020   Synovitis of knee 10/13/2020   Acute medial meniscal tear, left, initial encounter 09/22/2020   Constipation 06/21/2019   Mixed hyperlipidemia 12/15/2018   Depression 12/15/2018   S/P laparoscopic hysterectomy - 06/15/12 06/23/2012    PCP: Minette Brine, FNP   REFERRING PROVIDER: Vanetta Mulders, MD   REFERRING DIAG: [id number] (ICD-10-CM) - Unilateral primary osteoarthritis, left knee   THERAPY DIAG:  Chronic pain of left knee  Muscle weakness (generalized)  Difficulty in walking, not elsewhere classified  Rationale for Evaluation and Treatment: Rehabilitation  ONSET DATE: 2-3 years  SUBJECTIVE:   SUBJECTIVE STATEMENT: Pt reports new onset of L knee pain at night.   PERTINENT HISTORY: Dr Sammuel Hines referred to Dr Elba Barman for PRP PAIN:  Are you having pain? Yes: NPRS scale: 1-2/10 when WB, 0/10 at rest  Pain location: left knee Pain description: sharp / stabbing with WB Aggravating factors: walking; standing Relieving factors: sitting; tramadol  PRECAUTIONS: Knee  WEIGHT BEARING RESTRICTIONS: No  FALLS:  Has patient fallen in last 6 months? No  LIVING ENVIRONMENT: Lives with: lives with their family Lives in: House/apartment Stairs: Yes: Internal: 16 steps; on right going up Has following equipment at home:  None  OCCUPATION: administration  PLOF: Independent  PATIENT GOALS: I want muscles back and walk and get my weight down  NEXT MD VISIT: 4 weeks  OBJECTIVE:   DIAGNOSTIC FINDINGS: none in chart  PATIENT SURVEYS:  FOTO risk adjusted 49% with goal of 62% visit 15  COGNITION: Overall cognitive status: Within functional limits for tasks assessed     SENSATION: WFL  EDEMA:  Minimal suprapatellar  MUSCLE LENGTH: Hamstrings:wfl   POSTURE: No Significant postural  limitations  PALPATION: TTP medial aspect of knee  LOWER EXTREMITY ROM:  Active ROM Right eval Left eval  Hip flexion    Hip extension    Hip abduction    Hip adduction    Hip internal rotation    Hip external rotation    Knee flexion 140 110  Knee extension 0 0  Ankle dorsiflexion    Ankle plantarflexion    Ankle inversion    Ankle eversion     (Blank rows = not tested)  LOWER EXTREMITY MMT:  MMT Right eval Left eval  Hip flexion  49.8  Hip extension    Hip abduction  46.5  Hip adduction    Hip internal rotation    Hip external rotation    Knee flexion  47.4  Knee extension    Ankle dorsiflexion    Ankle plantarflexion    Ankle inversion    Ankle eversion     (Blank rows = not tested)   FUNCTIONAL TESTS:  5 times sit to stand: 17.63 Timed up and go (TUG): 10.04  GAIT: Distance walked: 400 ft Assistive device utilized: None Level of assistance: Complete Independence Comments: Normal pattern during assessment   TODAY'S TREATMENT:                                                                                                                               11/19/22:  -L knee PROM -Patella mobilizations -L HSS 30sec x3 -SAQ 2# 5" hold 2x10 -Supine SLR -LAQ 5# 5" hld 2x10L -Sidleying hip abduction 2x10 -Bridges 2x10 -standing calf stretch 30secx3 L -fwd step up 4" x15 -Eccentric fwd reach from floor level 2x10 -walking marches hall x2 -Sidestepping hall x2 -Heel/toe raises 3x10    Previous: Pt seen for aquatic therapy today.  Treatment took place in water 3.25-4.5 ft in depth at the [place] [facility]. Temp of water was 91.  Pt entered/exited the pool via stair using step-through pattern with heavy UE on hand rail.  *Without support: walking forward, back,  and side stepping  * forward walking kicks * holding yellow hand floats:  leg swings in hip flex/ext and into abdct/ add x 10 x 2 each (2nd set with increased speed) * split squats  at wall * walking split squats forward/backward  * reciprocal pattern up, no rail, with rail going down * runners step up, alternating * forward step ups on 2nd step, slow retro step down, with UE on  rails * step down with RLE and retro step up with LLE x 10, UE on rails, (improved) * return to walking forward / backward with increased speed ->  light jog forward/ backward in 4 ft depth * quad stretch with solid noodle (too much stretch), repeated holding ankle with improved tolerance   Pt requires the buoyancy and hydrostatic pressure of water for support, and to offload joints by unweighting joint load by at least 50 % in navel deep water and by at least 75-80% in chest to neck deep water.  Viscosity of the water is needed for resistance of strengthening. Water current perturbations provides challenge to standing balance requiring increased core activation.     PATIENT EDUCATION:  Education details: aquatic progressions and modifications Person educated: Patient Education method: Explanation Education comprehension: verbalized understanding  HOME EXERCISE PROGRAM: Aquatic HEP TBA  ASSESSMENT:  CLINICAL IMPRESSION:  Pt had excellent tolerance for her first land PT visit. Has most difficulty with step ups and eccentric fwd reaches. Overall good quad activation and strength observed.  Provided pt with HEP to work on hip/knee strengthening at home.   OBJECTIVE IMPAIRMENTS: Abnormal gait, decreased mobility, difficulty walking, decreased strength, and pain.   ACTIVITY LIMITATIONS: lifting, squatting, stairs, and transfers  PARTICIPATION LIMITATIONS: cleaning, shopping, community activity, and occupation  PERSONAL FACTORS: Time since onset of injury/illness/exacerbation are also affecting patient's functional outcome.   REHAB POTENTIAL: Good  CLINICAL DECISION MAKING: Evolving/moderate complexity  EVALUATION COMPLEXITY: Moderate   GOALS: Goals reviewed with patient?  Yes  SHORT TERM GOALS: Target date: 11/06/22 Pt will tolerate full aquatic sessions consistently without increase in pain and with improving function to demonstrate good toleration and effectiveness of intervention.   Baseline: Goal status: MET - 11/06/22  2.  Pt will tolerate stair climbing using alternating pattern ascending and descending 6 steps without use of handrail Baseline:difficult at eval.  Using reciprocal pattern, but needs rail with descending. Goal status:Partially met - 11/06/22  3.  Pt will have improved toleration to amb tolerating >10 minutes of continuous amb Baseline: 5 minutes at eval Goal status: MET -11/06/22    LONG TERM GOALS: Target date: 11/27/22  Pt to meet stated Foto Goal of 62% Baseline: FOTO risk adjusted 49% Goal status: INITIAL  2.  Pt will be indep with final HEP's (land and aquatic as appropriate) for continued management of condition  Baseline: TBA Goal status: INITIAL  3.  Pt will improve on 5 X STS test to <or=  10s to demonstrate improving functional lower extremity strength, transitional movements, and balance.  Baseline: 17.63 Goal status: INITIAL  4.  Pt will improve left knee strength by 5-10lbs Baseline: see chart Goal status: INITIAL     PLAN:  PT FREQUENCY: 1-2x/week  PT DURATION: 6 weeks  PLANNED INTERVENTIONS: Therapeutic exercises, Therapeutic activity, Neuromuscular re-education, Balance training, Gait training, Patient/Family education, Self Care, Joint mobilization, Stair training, DME instructions, Aquatic Therapy, Dry Needling, Electrical stimulation, Cryotherapy, Moist heat, Splintting, Taping, Ultrasound, Ionotophoresis 4mg /ml Dexamethasone, Manual therapy, and Re-evaluation  PLAN FOR NEXT SESSION: strengthening hip/knee R/L,  stairs; create HEP  Sherlynn Carbon, PTA  11/19/22 12:50 PM [facility] [address] [place], [place], [address] Phone: [phone]    Fax:  [phone]

## 2022-11-21 ENCOUNTER — Encounter (HOSPITAL_BASED_OUTPATIENT_CLINIC_OR_DEPARTMENT_OTHER): Payer: Self-pay

## 2022-11-21 ENCOUNTER — Ambulatory Visit (HOSPITAL_BASED_OUTPATIENT_CLINIC_OR_DEPARTMENT_OTHER): Payer: BC Managed Care – PPO

## 2022-11-26 ENCOUNTER — Encounter (HOSPITAL_BASED_OUTPATIENT_CLINIC_OR_DEPARTMENT_OTHER): Payer: Self-pay

## 2022-11-26 ENCOUNTER — Ambulatory Visit (HOSPITAL_BASED_OUTPATIENT_CLINIC_OR_DEPARTMENT_OTHER): Payer: BC Managed Care – PPO | Attending: Orthopaedic Surgery

## 2022-11-26 DIAGNOSIS — G8929 Other chronic pain: Secondary | ICD-10-CM | POA: Diagnosis not present

## 2022-11-26 DIAGNOSIS — M25562 Pain in left knee: Secondary | ICD-10-CM | POA: Diagnosis not present

## 2022-11-26 DIAGNOSIS — M6281 Muscle weakness (generalized): Secondary | ICD-10-CM | POA: Diagnosis not present

## 2022-11-26 DIAGNOSIS — R262 Difficulty in walking, not elsewhere classified: Secondary | ICD-10-CM | POA: Insufficient documentation

## 2022-11-26 NOTE — Therapy (Signed)
OUTPATIENT PHYSICAL THERAPY LOWER EXTREMITY TREATEMENT   Patient Name: Yvonne [name] MRN: [id number] DOB:1970/12/19, 52 y.o., female Today's Date: 11/26/2022  END OF SESSION:  PT End of Session - 11/26/22 1318     Visit Number 6    Number of Visits 12    Date for PT Re-Evaluation 11/27/22    Authorization Type BCBS    PT Start Time 1303    PT Stop Time 1345    PT Time Calculation (min) 42 min    Activity Tolerance Patient tolerated treatment well    Behavior During Therapy [facility] for tasks assessed/performed                Past Medical History:  Diagnosis Date   Allergy    Depression    Diabetes mellitus without complication    Fibroid [id number]   H/O menorrhagia 01/16/2011   Herpes 2009   HPV in female    Hx: UTI (urinary tract infection) 05/06/2011   Hyperlipidemia    Hypertension    PONV (postoperative nausea and vomiting)    Vulvitis 02/04/2011   Yeast infection 06/19/2010   Past Surgical History:  Procedure Laterality Date   ABDOMINAL HYSTERECTOMY     CESAREAN SECTION  2008 & 2009   COLONOSCOPY  11/24/2019   CRYOTHERAPY  2009   CYSTOSCOPY  06/15/2012   Procedure: CYSTOSCOPY;  Surgeon: Delice Lesch, MD;  Location: [facility];  Service: Gynecology;  Laterality: N/A;   KNEE ARTHROSCOPY Left 05/16/2020   Procedure: LEFT KNEE ARTHROSCOPY, PARTIAL MENISCECTOMY;  Surgeon: Melrose Nakayama, MD;  Location: [facility];  Service: Orthopedics;  Laterality: Left;   KNEE ARTHROSCOPY WITH MEDIAL MENISECTOMY Left 10/13/2020   Procedure: LEFT KNEE ARTHROSCOPY WITH PARTIAL MEDIAL MENISCECTOMY;  Surgeon: Leandrew Koyanagi, MD;  Location: [facility];  Service: Orthopedics;  Laterality: Left;   LAPAROSCOPIC HYSTERECTOMY  06/15/2012   Procedure: HYSTERECTOMY TOTAL LAPAROSCOPIC;  Surgeon: Delice Lesch, MD;  Location: [facility];  Service: Gynecology;  Laterality: N/A;   MYOMECTOMY  2005   TUBAL LIGATION     bilateral   Patient Active Problem List   Diagnosis Date Noted    Type 2 diabetes mellitus 11/11/2022   Pain in left foot 12/24/2021   Familial hyperlipidemia 02/06/2021   Chondromalacia, left knee 10/13/2020   Synovitis of knee 10/13/2020   Acute medial meniscal tear, left, initial encounter 09/22/2020   Constipation 06/21/2019   Mixed hyperlipidemia 12/15/2018   Depression 12/15/2018   S/P laparoscopic hysterectomy - 06/15/12 06/23/2012    PCP: Minette Brine, FNP   REFERRING PROVIDER: Vanetta Mulders, MD   REFERRING DIAG: [phone] (ICD-10-CM) - Unilateral primary osteoarthritis, left knee   THERAPY DIAG:  Chronic pain of left knee  Muscle weakness (generalized)  Difficulty in walking, not elsewhere classified  Rationale for Evaluation and Treatment: Rehabilitation  ONSET DATE: 2-3 years  SUBJECTIVE:   SUBJECTIVE STATEMENT: Pt reports increased pain in posterior knee in HS area. "I think it's from the exercises last visit."  PERTINENT HISTORY: Dr Sammuel Hines referred to Dr Elba Barman for PRP PAIN:  Are you having pain? Yes: NPRS scale: 1-2/10 when WB, 0/10 at rest  Pain location: left knee Pain description: sharp / stabbing with WB Aggravating factors: walking; standing Relieving factors: sitting; tramadol  PRECAUTIONS: Knee  WEIGHT BEARING RESTRICTIONS: No  FALLS:  Has patient fallen in last 6 months? No  LIVING ENVIRONMENT: Lives with: lives with their family Lives in: House/apartment Stairs: Yes: Internal: 16 steps; on right going  up Has following equipment at home: None  OCCUPATION: administration  PLOF: Independent  PATIENT GOALS: I want muscles back and walk and get my weight down  NEXT MD VISIT: 4 weeks  OBJECTIVE:   DIAGNOSTIC FINDINGS: none in chart  PATIENT SURVEYS:  FOTO risk adjusted 49% with goal of 62% visit 15  COGNITION: Overall cognitive status: Within functional limits for tasks assessed     SENSATION: WFL  EDEMA:  Minimal suprapatellar  MUSCLE LENGTH: Hamstrings:wfl   POSTURE: No  Significant postural limitations  PALPATION: TTP medial aspect of knee  LOWER EXTREMITY ROM:  Active ROM Right eval Left eval  Hip flexion    Hip extension    Hip abduction    Hip adduction    Hip internal rotation    Hip external rotation    Knee flexion 140 110  Knee extension 0 0  Ankle dorsiflexion    Ankle plantarflexion    Ankle inversion    Ankle eversion     (Blank rows = not tested)  LOWER EXTREMITY MMT:  MMT Right eval Left eval  Hip flexion  49.8  Hip extension    Hip abduction  46.5  Hip adduction    Hip internal rotation    Hip external rotation    Knee flexion  47.4  Knee extension    Ankle dorsiflexion    Ankle plantarflexion    Ankle inversion    Ankle eversion     (Blank rows = not tested)   FUNCTIONAL TESTS:  5 times sit to stand: 17.63 Timed up and go (TUG): 10.04  GAIT: Distance walked: 400 ft Assistive device utilized: None Level of assistance: Complete Independence Comments: Normal pattern during assessment   TODAY'S TREATMENT:                                                                                                                               11/26/22:  -L knee PROM -STM medial distal HS -SAQ 4# 5" hold 2x10 -Supine SLR 2# 2x10 -LAQ 7.5# 5" hld 2x10L -Sidleying hip abduction 2# 2x10 -Bridges 2x15 -incline calf stretch 30secx3 bil -squats at rail 2x10 -fwd step up 4" 2x10 with opposite march  -Sidestepping with squat GTB at ankle x2 laps at rail   11/19/22:  -L knee PROM -Patella mobilizations -L HSS 30sec x3 -SAQ 2# 5" hold 2x10 -Supine SLR -LAQ 5# 5" hld 2x10L -Sidleying hip abduction 2x10 -Bridges 2x10 -standing calf stretch 30secx3 L -fwd step up 4" x15 -Eccentric fwd reach from floor level 2x10 -walking marches hall x2 -Sidestepping hall x2 -Heel/toe raises 3x10    Previous: Pt seen for aquatic therapy today.  Treatment took place in water 3.25-4.5 ft in depth at the [facility].  Temp of water was 91.  Pt entered/exited the pool via stair using step-through pattern with heavy UE on hand rail.  *Without support: walking forward, back,  and side stepping  * forward walking  kicks * holding yellow hand floats:  leg swings in hip flex/ext and into abdct/ add x 10 x 2 each (2nd set with increased speed) * split squats at wall * walking split squats forward/backward  * reciprocal pattern up, no rail, with rail going down * runners step up, alternating * forward step ups on 2nd step, slow retro step down, with UE on rails * step down with RLE and retro step up with LLE x 10, UE on rails, (improved) * return to walking forward / backward with increased speed ->  light jog forward/ backward in 4 ft depth * quad stretch with solid noodle (too much stretch), repeated holding ankle with improved tolerance   Pt requires the buoyancy and hydrostatic pressure of water for support, and to offload joints by unweighting joint load by at least 50 % in navel deep water and by at least 75-80% in chest to neck deep water.  Viscosity of the water is needed for resistance of strengthening. Water current perturbations provides challenge to standing balance requiring increased core activation.     PATIENT EDUCATION:  Education details: aquatic progressions and modifications Person educated: Patient Education method: Explanation Education comprehension: verbalized understanding  HOME EXERCISE PROGRAM: Aquatic HEP TBA  ASSESSMENT:  CLINICAL IMPRESSION:  Monitored posterior knee pain throughout session today. Pt is tender in localized over distal medial HS tendon. Gentle STM was performed to this area. She denied pain with progressions of other exercises today. Held [facility] due to her recent increase in pain. Pt to be re-evaluated next visit. She will benefit from continued quad, hip, and HS strengthening.   OBJECTIVE IMPAIRMENTS: Abnormal gait, decreased mobility, difficulty walking,  decreased strength, and pain.   ACTIVITY LIMITATIONS: lifting, squatting, stairs, and transfers  PARTICIPATION LIMITATIONS: cleaning, shopping, community activity, and occupation  PERSONAL FACTORS: Time since onset of injury/illness/exacerbation are also affecting patient's functional outcome.   REHAB POTENTIAL: Good  CLINICAL DECISION MAKING: Evolving/moderate complexity  EVALUATION COMPLEXITY: Moderate   GOALS: Goals reviewed with patient? Yes  SHORT TERM GOALS: Target date: 11/06/22 Pt will tolerate full aquatic sessions consistently without increase in pain and with improving function to demonstrate good toleration and effectiveness of intervention.   Baseline: Goal status: MET - 11/06/22  2.  Pt will tolerate stair climbing using alternating pattern ascending and descending 6 steps without use of handrail Baseline:difficult at eval.  Using reciprocal pattern, but needs rail with descending. Goal status:Partially met - 11/06/22  3.  Pt will have improved toleration to amb tolerating >10 minutes of continuous amb Baseline: 5 minutes at eval Goal status: MET -11/06/22    LONG TERM GOALS: Target date: 11/27/22  Pt to meet stated Foto Goal of 62% Baseline: FOTO risk adjusted 49% Goal status: INITIAL  2.  Pt will be indep with final HEP's (land and aquatic as appropriate) for continued management of condition  Baseline: TBA Goal status: INITIAL  3.  Pt will improve on 5 X STS test to <or=  10s to demonstrate improving functional lower extremity strength, transitional movements, and balance.  Baseline: 17.63 Goal status: INITIAL  4.  Pt will improve left knee strength by 5-10lbs Baseline: see chart Goal status: INITIAL     PLAN:  PT FREQUENCY: 1-2x/week  PT DURATION: 6 weeks  PLANNED INTERVENTIONS: Therapeutic exercises, Therapeutic activity, Neuromuscular re-education, Balance training, Gait training, Patient/Family education, Self Care, Joint mobilization, Stair  training, DME instructions, Aquatic Therapy, Dry Needling, Electrical stimulation, Cryotherapy, Moist heat, Splintting, Taping, Ultrasound, Ionotophoresis 4mg /ml  Dexamethasone, Manual therapy, and Re-evaluation  PLAN FOR NEXT SESSION: strengthening hip/knee R/L,  stairs; create HEP  Sherlynn Carbon, PTA  11/26/22 3:08 PM [facility] [address] [place], [place], [address] Phone: [phone]   Fax:  [phone]

## 2022-12-05 ENCOUNTER — Encounter (HOSPITAL_BASED_OUTPATIENT_CLINIC_OR_DEPARTMENT_OTHER): Payer: Self-pay | Admitting: Physical Therapy

## 2022-12-05 ENCOUNTER — Ambulatory Visit (HOSPITAL_BASED_OUTPATIENT_CLINIC_OR_DEPARTMENT_OTHER): Payer: BC Managed Care – PPO | Admitting: Physical Therapy

## 2022-12-05 DIAGNOSIS — R262 Difficulty in walking, not elsewhere classified: Secondary | ICD-10-CM

## 2022-12-05 DIAGNOSIS — M25562 Pain in left knee: Secondary | ICD-10-CM | POA: Diagnosis not present

## 2022-12-05 DIAGNOSIS — M6281 Muscle weakness (generalized): Secondary | ICD-10-CM

## 2022-12-05 DIAGNOSIS — G8929 Other chronic pain: Secondary | ICD-10-CM | POA: Diagnosis not present

## 2022-12-05 NOTE — Therapy (Signed)
OUTPATIENT PHYSICAL THERAPY LOWER EXTREMITY TREATEMENT   Patient Name: Yvonne [name] MRN: [id number] DOB:[birth year], 52 y.o., female Today's Date: 12/05/2022  END OF SESSION:  PT End of Session - 12/05/22 1432     Visit Number 7    Number of Visits 12    Date for PT Re-Evaluation 12/27/22    Authorization Type BC[employer]    PT Start Time 1432    PT Stop Time 1510    PT Time Calculation (min) 38 min    Activity Tolerance Patient tolerated treatment well    Behavior During Therapy [facility] for tasks assessed/performed                 Past Medical History:  Diagnosis Date   Allergy    Depression    Diabetes mellitus without complication    Fibroid 2005   H/O menorrhagia 01/16/2011   Herpes 2009   HPV in female    Hx: UTI (urinary tract infection) 05/06/2011   Hyperlipidemia    Hypertension    PONV (postoperative nausea and vomiting)    Vulvitis 02/04/2011   Yeast infection 06/19/2010   Past Surgical History:  Procedure Laterality Date   ABDOMINAL HYSTERECTOMY     CESAREAN SECTION  2008 & 2009   COLONOSCOPY  11/24/2019   CRYOTHERAPY  2009   CYSTOSCOPY  06/15/2012   Procedure: CYSTOSCOPY;  Surgeon: Purcell Nails, MD;  Location: [facility];  Service: Gynecology;  Laterality: N/A;   KNEE ARTHROSCOPY Left 05/16/2020   Procedure: LEFT KNEE ARTHROSCOPY, PARTIAL MENISCECTOMY;  Surgeon: Marcene Corning, MD;  Location: [facility];  Service: Orthopedics;  Laterality: Left;   KNEE ARTHROSCOPY WITH MEDIAL MENISECTOMY Left 10/13/2020   Procedure: LEFT KNEE ARTHROSCOPY WITH PARTIAL MEDIAL MENISCECTOMY;  Surgeon: Tarry Kos, MD;  Location: [facility];  Service: Orthopedics;  Laterality: Left;   LAPAROSCOPIC HYSTERECTOMY  06/15/2012   Procedure: HYSTERECTOMY TOTAL LAPAROSCOPIC;  Surgeon: Purcell Nails, MD;  Location: [facility];  Service: Gynecology;  Laterality: N/A;   MYOMECTOMY  2005   TUBAL LIGATION     bilateral   Patient Active Problem List   Diagnosis Date Noted    Type 2 diabetes mellitus 11/11/2022   Pain in left foot 12/24/2021   Familial hyperlipidemia 02/06/2021   Chondromalacia, left knee 10/13/2020   Synovitis of knee 10/13/2020   Acute medial meniscal tear, left, initial encounter 09/22/2020   Constipation 06/21/2019   Mixed hyperlipidemia 12/15/2018   Depression 12/15/2018   S/P laparoscopic hysterectomy - 06/15/12 06/23/2012    PCP: Arnette Felts, FNP   REFERRING PROVIDER: Huel Cote, MD   REFERRING DIAG: [id number] (ICD-10-CM) - Unilateral primary osteoarthritis, left knee   THERAPY DIAG:  Chronic pain of left knee  Muscle weakness (generalized)  Difficulty in walking, not elsewhere classified  Rationale for Evaluation and Treatment: Rehabilitation  ONSET DATE: 2-3 years  SUBJECTIVE:   SUBJECTIVE STATEMENT: No pain on medial aspect where he placed the injection. The back of it is killing me and I can hardly bend it. It gets so tight. I have a really hard time standing up after driving home (about 20 min)  PERTINENT HISTORY: Dr Steward Drone referred to Dr Madelyn Brunner for PRP PAIN:  Are you having pain? Yes: 0/10 sitting and standing still, walking is when it gets so tight 3-4/10 right now Pain location: left knee Pain description: tight, throbbing Aggravating factors: walking; standing Relieving factors: sitting; tramadol  PRECAUTIONS: Knee  WEIGHT BEARING RESTRICTIONS: No  FALLS:  Has  patient fallen in last 6 months? No  LIVING ENVIRONMENT: Lives with: lives with their family Lives in: House/apartment Stairs: Yes: Internal: 16 steps; on right going up Has following equipment at home: None  OCCUPATION: administration  PLOF: Independent  PATIENT GOALS: I want muscles back and walk and get my weight down  NEXT MD VISIT: 4 weeks  OBJECTIVE:   DIAGNOSTIC FINDINGS: none in chart  PATIENT SURVEYS:  FOTO risk adjusted 49% with goal of 62% visit 15  COGNITION: Overall cognitive status: Within functional  limits for tasks assessed     SENSATION: WFL  EDEMA:  Minimal suprapatellar  MUSCLE LENGTH: Hamstrings:wfl   POSTURE: No Significant postural limitations  PALPATION: 4/11: apparent Baker's cyst in post Lt knee, tightness in gastroc and HS   LOWER EXTREMITY ROM:  Active ROM Right eval Left eval Lt  4/11  Hip flexion     Hip extension     Hip abduction     Hip adduction     Hip internal rotation     Hip external rotation     Knee flexion 140 110 120  Knee extension 0 0 0  Ankle dorsiflexion     Ankle plantarflexion     Ankle inversion     Ankle eversion      (Blank rows = not tested)  LOWER EXTREMITY MMT:  MMT Right eval Left eval  Hip flexion  49.8  Hip extension    Hip abduction  46.5  Hip adduction    Hip internal rotation    Hip external rotation    Knee flexion  47.4  Knee extension    Ankle dorsiflexion    Ankle plantarflexion    Ankle inversion    Ankle eversion     (Blank rows = not tested)   FUNCTIONAL TESTS:  5 times sit to stand: 17.63 Timed up and go (TUG): 10.04  GAIT: Distance walked: 400 ft Assistive device utilized: None Level of assistance: Complete Independence Comments: Normal pattern during assessment   TODAY'S TREATMENT:                                                                                                                               Treatment                            4/11:  IASTM to HS and gastroc, STM to Lt piriformis Seated & supine HSS Supine figure 4 Heel raies edge of step Variations of stretches in standing Gait: allowing DF and heel strike   11/26/22:  -L knee PROM -STM medial distal HS -SAQ 4# 5" hold 2x10 -Supine SLR 2# 2x10 -LAQ 7.5# 5" hld 2x10L -Sidleying hip abduction 2# 2x10 -Bridges 2x15 -incline calf stretch 30secx3 bil -squats at rail 2x10 -fwd step up 4" 2x10 with opposite march  -Sidestepping with squat GTB at ankle x2 laps at rail  PATIENT EDUCATION:  Education  details: Teacher, musicAnatomy of condition, POC, HEP, exercise form/rationale Person educated: Patient Education method: Explanation Education comprehension: verbalized understanding  HOME EXERCISE PROGRAM: [id number]  ASSESSMENT:  CLINICAL IMPRESSION:  Pt has made excellent progress in gross strength and ROM but limited by post pain. Apparent bakers cyst present with associated spasm of hamstrings and gastroc. Focus today on IASTM to decrease spasm and stretches which pt reported made her feel much better. Will f/u in 2-3 weeks after she works on the lengthening regularly, also plans to make an appt for a deep tissue massage.    OBJECTIVE IMPAIRMENTS: Abnormal gait, decreased mobility, difficulty walking, decreased strength, and pain.   ACTIVITY LIMITATIONS: lifting, squatting, stairs, and transfers  PARTICIPATION LIMITATIONS: cleaning, shopping, community activity, and occupation  PERSONAL FACTORS: Time since onset of injury/illness/exacerbation are also affecting patient's functional outcome.   REHAB POTENTIAL: Good  CLINICAL DECISION MAKING: Evolving/moderate complexity  EVALUATION COMPLEXITY: Moderate   GOALS: Goals reviewed with patient? Yes  SHORT TERM GOALS: Target date: 11/06/22 Pt will tolerate full aquatic sessions consistently without increase in pain and with improving function to demonstrate good toleration and effectiveness of intervention.   Baseline: Goal status: MET - 11/06/22  2.  Pt will tolerate stair climbing using alternating pattern ascending and descending 6 steps without use of handrail Baseline:difficult at eval.  Using reciprocal pattern, but needs rail with descending. Goal status:Partially met - 11/06/22  3.  Pt will have improved toleration to amb tolerating >10 minutes of continuous amb Baseline: 5 minutes at eval Goal status: MET -11/06/22    LONG TERM GOALS: Target date: 11/27/22  Pt to meet stated Foto Goal of 62% Baseline: 47 Goal status:  ongoing  2.  Pt will be indep with final HEP's (land and aquatic as appropriate) for continued management of condition  Baseline: TBA Goal status: ongoing- land created today  3.  Pt will improve on 5 X STS test to <or=  10s to demonstrate improving functional lower extremity strength, transitional movements, and balance.  Baseline: 9 s Goal status: achieved  4.  Pt will improve left knee strength by 5-10lbs Baseline: see chart Goal status: INITIAL     PLAN:  PT FREQUENCY: 1-2x/week  PT DURATION: 6 weeks  PLANNED INTERVENTIONS: Therapeutic exercises, Therapeutic activity, Neuromuscular re-education, Balance training, Gait training, Patient/Family education, Self Care, Joint mobilization, Stair training, DME instructions, Aquatic Therapy, Dry Needling, Electrical stimulation, Cryotherapy, Moist heat, Splintting, Taping, Ultrasound, Ionotophoresis 4mg /ml Dexamethasone, Manual therapy, and Re-evaluation  PLAN FOR NEXT SESSION: strengthening hip/knee R/L,  stairs; create HEP  Tyreonna Czaplicki C. Alissah Redmon PT, DPT 12/05/22 3:12 PM  [facility] [address] [place], [place], [id number] Phone: [phone]   Fax:  [phone]

## 2022-12-06 ENCOUNTER — Encounter (HOSPITAL_BASED_OUTPATIENT_CLINIC_OR_DEPARTMENT_OTHER): Payer: Self-pay | Admitting: Physical Therapy

## 2022-12-09 ENCOUNTER — Encounter: Payer: Self-pay | Admitting: Nurse Practitioner

## 2022-12-09 MED ORDER — VALACYCLOVIR HCL 1 G PO TABS: 1000.0000 mg | ORAL_TABLET | Freq: Two times a day (BID) | ORAL | 1 refills | Status: DC

## 2022-12-17 ENCOUNTER — Ambulatory Visit: Payer: BC Managed Care – PPO | Admitting: Sports Medicine

## 2022-12-19 ENCOUNTER — Ambulatory Visit: Payer: BC Managed Care – PPO | Admitting: Nurse Practitioner

## 2022-12-19 ENCOUNTER — Encounter: Payer: Self-pay | Admitting: Nurse Practitioner

## 2022-12-19 VITALS — BP 108/58 | HR 79 | Temp 98.4°F | Ht 65.0 in | Wt 162.0 lb

## 2022-12-19 DIAGNOSIS — R112 Nausea with vomiting, unspecified: Secondary | ICD-10-CM | POA: Diagnosis not present

## 2022-12-19 DIAGNOSIS — R1084 Generalized abdominal pain: Secondary | ICD-10-CM | POA: Diagnosis not present

## 2022-12-19 MED ORDER — DICYCLOMINE HCL 20 MG PO TABS: 20.0000 mg | ORAL_TABLET | Freq: Four times a day (QID) | ORAL | 0 refills | Status: DC

## 2022-12-19 MED ORDER — PROMETHAZINE HCL 25 MG/ML IJ SOLN
25.0000 mg | Freq: Once | INTRAMUSCULAR | Status: AC
  Administered 2022-12-19: 25 mg via INTRAMUSCULAR

## 2022-12-19 MED ORDER — PROMETHAZINE HCL 50 MG/ML IJ SOLN: 25.0000 mg | Freq: Four times a day (QID) | INTRAMUSCULAR | Status: DC | PRN

## 2022-12-19 NOTE — Progress Notes (Signed)
I,Sheena H Holbrook,acting as a Neurosurgeon for Arnette Felts, FNP.,have documented all relevant documentation on the behalf of Arnette Felts, FNP,as directed by  Arnette Felts, FNP while in the presence of Arnette Felts, FNP.    Subjective:     Patient ID: Yvonne [name] , female    DOB: 01/04/1971 , 52 y.o.   MRN: [id number]   Chief Complaint  Patient presents with   Nausea    HPI  Patient presents today for nausea and emesis that started Monday evening. She states the emesis stopped Tuesday but the nausea continues. She denies diarrhea, does report abd pain, cramping and excess burping. She has a decreased appetite. She has been taking Zofran with some relief, she has not taken anything for reflux. She denies fevers but has felt some chills.    She is drinking clear liquids.      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    Hx: UTI (urinary tract infection) 05/06/2011   Hyperlipidemia    Hypertension    PONV (postoperative nausea and vomiting)    Vulvitis 02/04/2011   Yeast infection 06/19/2010     Family History  Problem Relation Age of Onset   Hypertension Mother    Heart disease Mother    Diabetes Father    Alcohol abuse Father    Diabetes Brother    Hypertension Brother    Alcohol abuse Brother    Heart attack Maternal Grandfather    Stomach cancer Maternal Grandfather    Hypertension Brother    Crohn's disease Other        mat 2nd cousin   Hyperlipidemia Other    Irritable bowel syndrome Other        mat. nephew   Colon cancer Neg Hx    Breast cancer Neg Hx    Colon polyps Neg Hx    Esophageal cancer Neg Hx    Rectal cancer Neg Hx      Current Outpatient Medications:    Adapalene 0.3 % gel, Apply 1 application topically as needed (zits). , Disp: , Rfl:    atorvastatin (LIPITOR) 40 MG tablet, TAKE 1 TABLET (40 MG  TOTAL) BY MOUTH DAILY. PLEASE KEEP SCHEDULED APPOINTMENT, Disp: 90 tablet, Rfl: 3   cyclobenzaprine (FLEXERIL) 10 MG tablet, Take 10 mg by mouth 3 (three) times daily as needed., Disp: , Rfl:    dicyclomine (BENTYL) 20 MG tablet, Take 1 tablet (20 mg total) by mouth every 6 (six) hours., Disp: 30 tablet, Rfl: 0   Evolocumab (REPATHA SURECLICK) 140 MG/ML SOAJ, Inject 1 Dose into the skin every 14 (fourteen) days., Disp: 6 mL, Rfl: 3   ipratropium (ATROVENT) 0.03 % nasal spray, PLACE 1 SPRAY INTO BOTH NOSTRILS DAILY, Disp: 30 mL, Rfl: 1   Melatonin 5 MG CHEW, Chew by mouth as needed., Disp: , Rfl:    Multiple Vitamins-Minerals (CENTRUM ADULTS PO), Take 1 tablet by mouth daily., Disp: , Rfl:    Omega-3 Fatty Acids (FISH OIL PO), Take 1 capsule by mouth daily., Disp: , Rfl:    PARoxetine (PAXIL) 10 MG tablet, Take 1 tablet (10 mg total) by mouth daily., Disp: 90 tablet, Rfl: 1   Semaglutide, 2 MG/DOSE, (OZEMPIC, 2 MG/DOSE,) 8 MG/3ML SOPN, Inject 2 mg into the skin once a week., Disp: 9 mL, Rfl: 1  Sennosides (SENNA) 8.6 MG CAPS, Take 1 capsule by mouth daily as needed., Disp: , Rfl:    traMADol (ULTRAM) 50 MG tablet, Take 1-2 tablets (50-100 mg total) by mouth every 6 (six) hours as needed for severe pain., Disp: 20 tablet, Rfl: 0   valACYclovir (VALTREX) 1000 MG tablet, Take 1 tablet (1,000 mg total) by mouth every 12 (twelve) hours., Disp: 180 tablet, Rfl: 1   omeprazole (PRILOSEC) 40 MG capsule, Take 1 capsule (40 mg total) by mouth daily. (Patient not taking: Reported on 12/19/2022), Disp: 90 capsule, Rfl: 3  Current Facility-Administered Medications:    promethazine (PHENERGAN) injection 25 mg, 25 mg, Intramuscular, Q6H PRN, Arnette Felts, FNP   Allergies  Allergen Reactions   Statins Other (See Comments)    myalgias   Sulfa Antibiotics Itching     Review of Systems  Constitutional: Negative.   Respiratory: Negative.    Cardiovascular: Negative.   Gastrointestinal:  Positive for  abdominal pain and nausea.  Neurological: Negative.   Psychiatric/Behavioral: Negative.    All other systems reviewed and are negative.    Today's Vitals   12/19/22 1034  BP: (!) 108/58  Pulse: 79  Temp: 98.4 F (36.9 C)  TempSrc: Oral  SpO2: 97%  Weight: 162 lb (73.5 kg)  Height: 5\' 5"  (1.651 m)   Body mass index is 26.96 kg/m.   Objective:  Physical Exam Constitutional:      General: She is not in acute distress.    Appearance: Normal appearance.  Cardiovascular:     Rate and Rhythm: Normal rate and regular rhythm.     Pulses: Normal pulses.     Heart sounds: Normal heart sounds. No murmur heard. Pulmonary:     Effort: Pulmonary effort is normal. No respiratory distress.     Breath sounds: Normal breath sounds. No wheezing.  Skin:    General: Skin is warm and dry.     Capillary Refill: Capillary refill takes less than 2 seconds.  Neurological:     General: No focal deficit present.     Mental Status: She is alert and oriented to person, place, and time.     Cranial Nerves: No cranial nerve deficit.     Motor: No weakness.  Psychiatric:        Mood and Affect: Mood normal.        Behavior: Behavior normal.        Thought Content: Thought content normal.        Judgment: Judgment normal.      Assessment And Plan:     1. Nausea and vomiting, unspecified vomiting type Comments: Will provide antiemetic and to eat a bland diet. - promethazine (PHENERGAN) injection 25 mg - CBC - CMP14+EGFR - Amylase - Lipase - DG Abd 1 View; Future - promethazine (PHENERGAN) injection 25 mg  2. Generalized abdominal pain Comments: Generalized tenderness likely due to gastroenteritis. Will check KUB and provide with bentyl for abdomen cramping. - dicyclomine (BENTYL) 20 MG tablet; Take 1 tablet (20 mg total) by mouth every 6 (six) hours.  Dispense: 30 tablet; Refill: 0 - DG Abd 1 View; Future    Patient was given opportunity to ask questions. Patient verbalized  understanding of the plan and was able to repeat key elements of the plan. All questions were answered to their satisfaction.  Arnette Felts, FNP   I, Arnette Felts, FNP, have reviewed all documentation for this visit. The documentation on 12/19/22 for the exam, diagnosis, procedures, and orders are all  accurate and complete.   IF YOU HAVE BEEN REFERRED TO A SPECIALIST, IT MAY TAKE 1-2 WEEKS TO SCHEDULE/PROCESS THE REFERRAL. IF YOU HAVE NOT HEARD FROM US/SPECIALIST IN TWO WEEKS, PLEASE GIVE [name] A CALL AT [phone].   THE PATIENT IS ENCOURAGED TO PRACTICE SOCIAL DISTANCING DUE TO THE COVID-19 PANDEMIC.

## 2022-12-20 ENCOUNTER — Ambulatory Visit
Admission: RE | Admit: 2022-12-20 | Discharge: 2022-12-20 | Disposition: A | Discharge status: Home / Self Care | Payer: BC Managed Care – PPO | Source: Ambulatory Visit | Attending: Nurse Practitioner | Admitting: Nurse Practitioner

## 2022-12-20 DIAGNOSIS — R109 Unspecified abdominal pain: Secondary | ICD-10-CM | POA: Diagnosis not present

## 2022-12-20 DIAGNOSIS — R1084 Generalized abdominal pain: Secondary | ICD-10-CM

## 2022-12-20 DIAGNOSIS — R112 Nausea with vomiting, unspecified: Secondary | ICD-10-CM

## 2022-12-20 DIAGNOSIS — R1110 Vomiting, unspecified: Secondary | ICD-10-CM | POA: Diagnosis not present

## 2022-12-20 LAB — CMP14+EGFR
ALT: 22 IU/L (ref 0–32)
AST: 18 IU/L (ref 0–40)
Albumin/Globulin Ratio: 2.3 — ABNORMAL HIGH (ref 1.2–2.2)
Albumin: 4.4 g/dL (ref 3.8–4.9)
Alkaline Phosphatase: 58 IU/L (ref 44–121)
BUN/Creatinine Ratio: 16 (ref 9–23)
BUN: 12 mg/dL (ref 6–24)
Bilirubin Total: 0.4 mg/dL (ref 0.0–1.2)
CO2: 22 mmol/L (ref 20–29)
Calcium: 9.4 mg/dL (ref 8.7–10.2)
Chloride: 100 mmol/L (ref 96–106)
Creatinine, Ser: 0.77 mg/dL (ref 0.57–1.00)
Globulin, Total: 1.9 g/dL (ref 1.5–4.5)
Glucose: 97 mg/dL (ref 70–99)
Potassium: 4.1 mmol/L (ref 3.5–5.2)
Sodium: 140 mmol/L (ref 134–144)
Total Protein: 6.3 g/dL (ref 6.0–8.5)
eGFR: 92 mL/min/{1.73_m2} (ref >59)

## 2022-12-20 LAB — CBC
Hematocrit: 43 % (ref 34.0–46.6)
Hemoglobin: 14.8 g/dL (ref 11.1–15.9)
MCH: 30.6 pg (ref 26.6–33.0)
MCHC: 34.4 g/dL (ref 31.5–35.7)
MCV: 89 fL (ref 79–97)
Platelets: 249 10*3/uL (ref 150–450)
RBC: 4.84 x10E6/uL (ref 3.77–5.28)
RDW: 12.2 % (ref 11.7–15.4)
WBC: 2.6 10*3/uL — ABNORMAL LOW (ref 3.4–10.8)

## 2022-12-20 LAB — LIPASE: Lipase: 61 U/L (ref 14–72)

## 2022-12-20 LAB — AMYLASE: Amylase: 51 U/L (ref 31–110)

## 2022-12-30 DIAGNOSIS — H16223 Keratoconjunctivitis sicca, not specified as Sjogren's, bilateral: Secondary | ICD-10-CM | POA: Diagnosis not present

## 2022-12-30 LAB — HM DIABETES EYE EXAM

## 2023-01-02 ENCOUNTER — Encounter (HOSPITAL_BASED_OUTPATIENT_CLINIC_OR_DEPARTMENT_OTHER): Payer: Self-pay | Admitting: Physical Therapy

## 2023-01-02 ENCOUNTER — Ambulatory Visit (HOSPITAL_BASED_OUTPATIENT_CLINIC_OR_DEPARTMENT_OTHER): Payer: BC Managed Care – PPO | Attending: Orthopaedic Surgery | Admitting: Physical Therapy

## 2023-01-02 DIAGNOSIS — R262 Difficulty in walking, not elsewhere classified: Secondary | ICD-10-CM | POA: Diagnosis not present

## 2023-01-02 DIAGNOSIS — G8929 Other chronic pain: Secondary | ICD-10-CM | POA: Diagnosis not present

## 2023-01-02 DIAGNOSIS — M6281 Muscle weakness (generalized): Secondary | ICD-10-CM | POA: Diagnosis not present

## 2023-01-02 DIAGNOSIS — M25562 Pain in left knee: Secondary | ICD-10-CM | POA: Insufficient documentation

## 2023-01-02 NOTE — Therapy (Signed)
OUTPATIENT PHYSICAL THERAPY LOWER EXTREMITY TREATEMENT   Patient Name: Yvonne [name] MRN: [id number] DOB:Sep 28, 1970, 52 y.o., female Today's Date: 01/02/2023  END OF SESSION:  PT End of Session - 01/02/23 1558     Visit Number 8    Number of Visits 12    Date for PT Re-Evaluation 02/02/23    Authorization Type BCBS    PT Start Time 1558    PT Stop Time 1640    PT Time Calculation (min) 42 min    Activity Tolerance Patient tolerated treatment well    Behavior During Therapy [facility] for tasks assessed/performed                  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    Hx: UTI (urinary tract infection) 05/06/2011   Hyperlipidemia    Hypertension    PONV (postoperative nausea and vomiting)    Vulvitis 02/04/2011   Yeast infection 06/19/2010   Past Surgical History:  Procedure Laterality Date   ABDOMINAL HYSTERECTOMY     CESAREAN SECTION  2008 & 2009   COLONOSCOPY  11/24/2019   CRYOTHERAPY  2009   CYSTOSCOPY  06/15/2012   Procedure: CYSTOSCOPY;  Surgeon: Purcell Nails, MD;  Location: [facility];  Service: Gynecology;  Laterality: N/A;   KNEE ARTHROSCOPY Left 05/16/2020   Procedure: LEFT KNEE ARTHROSCOPY, PARTIAL MENISCECTOMY;  Surgeon: Marcene Corning, MD;  Location: [facility];  Service: Orthopedics;  Laterality: Left;   KNEE ARTHROSCOPY WITH MEDIAL MENISECTOMY Left 10/13/2020   Procedure: LEFT KNEE ARTHROSCOPY WITH PARTIAL MEDIAL MENISCECTOMY;  Surgeon: Tarry Kos, MD;  Location: [facility];  Service: Orthopedics;  Laterality: Left;   LAPAROSCOPIC HYSTERECTOMY  06/15/2012   Procedure: HYSTERECTOMY TOTAL LAPAROSCOPIC;  Surgeon: Purcell Nails, MD;  Location: [facility];  Service: Gynecology;  Laterality: N/A;   MYOMECTOMY  2005   TUBAL LIGATION     bilateral   Patient Active Problem List   Diagnosis Date  Noted   Type 2 diabetes mellitus (HCC) 11/11/2022   Pain in left foot 12/24/2021   Familial hyperlipidemia 02/06/2021   Chondromalacia, left knee 10/13/2020   Synovitis of knee 10/13/2020   Acute medial meniscal tear, left, initial encounter 09/22/2020   Constipation 06/21/2019   Mixed hyperlipidemia 12/15/2018   Depression 12/15/2018   S/P laparoscopic hysterectomy - 06/15/12 06/23/2012    PCP: Arnette Felts, FNP   REFERRING PROVIDER: Huel Cote, MD   REFERRING DIAG: [phone] (ICD-10-CM) - Unilateral primary osteoarthritis, left knee   THERAPY DIAG:  Chronic pain of left knee  Muscle weakness (generalized)  Difficulty in walking, not elsewhere classified  Rationale for Evaluation and Treatment: Rehabilitation  ONSET DATE: 2-3 years  SUBJECTIVE:   SUBJECTIVE STATEMENT: I was so sick for 4 days straight and did not get up. I felt great for a bit after but then gradually came back as I was moving around more. My swelling gets out of control and I can do stuff when I keep it controlled. Curious about gel injections.   PERTINENT HISTORY: Dr Steward Drone referred to Dr Madelyn Brunner for PRP PAIN:  Are you having pain? Yes: 0/10 sitting and standing still, sharp pain in post knee as I get walking Pain location: left knee Pain description: tight, throbbing Aggravating factors: walking; standing after being seated  Relieving factors: rest; tramadol  PRECAUTIONS: Knee  WEIGHT BEARING RESTRICTIONS: No  FALLS:  Has patient fallen in last 6 months? No  LIVING ENVIRONMENT: Lives with: lives with their family Lives in: House/apartment Stairs: Yes: Internal: 16 steps; on right going up Has following equipment at home: None  OCCUPATION: administration  PLOF: Independent  PATIENT GOALS: I want muscles back and walk and get my weight down  OBJECTIVE:   DIAGNOSTIC FINDINGS: none in chart  PATIENT SURVEYS:  FOTO risk adjusted 49% with goal of 62% visit  15  COGNITION: Overall cognitive status: Within functional limits for tasks assessed      POSTURE: No Significant postural limitations  PALPATION: 4/11: apparent Baker's cyst in post Lt knee, tightness in gastroc and HS   LOWER EXTREMITY ROM:  Active ROM Right eval Left eval Lt  4/11  Hip flexion     Hip extension     Hip abduction     Hip adduction     Hip internal rotation     Hip external rotation     Knee flexion 140 110 120  Knee extension 0 0 0  Ankle dorsiflexion     Ankle plantarflexion     Ankle inversion     Ankle eversion      (Blank rows = not tested)  LOWER EXTREMITY MMT:  MMT  Left eval Left  5/9  Hip flexion  49.8   Hip extension     Hip abduction  46.5 4-/5  Hip adduction     Hip internal rotation     Hip external rotation     Knee flexion  47.4   Knee extension     Ankle dorsiflexion     Ankle plantarflexion     Ankle inversion     Ankle eversion      (Blank rows = not tested)   FUNCTIONAL TESTS:  5 times sit to stand: 17.63 Timed up and go (TUG): 10.04  GAIT: Distance walked: 400 ft Assistive device utilized: None Level of assistance: Complete Independence Comments: Normal pattern during assessment   TODAY'S TREATMENT:                                                                                                                               Treatment                            5/9:  Trigger Point Dry Needling, Manual Therapy Treatment:  Initial or subsequent education regarding Trigger Point Dry Needling: Initial Did patient give consent to treatment with Trigger Point Dry Needling: Yes TPDN with skilled palpation and monitoring followed by STM to the following muscles: Lt gastroc IASTM Lt gastroc Talocrural distraction & AP mobs at end range DF  Hip burner series: SL hip circles, arcs, abd Supine double leg lower with sacral support Supine TT with bil shoulder flexion Standing weight shift to heels  for postural  alignment   Treatment                            4/11:  IASTM to HS and gastroc, STM to Lt piriformis Seated & supine HSS Supine figure 4 Heel raies edge of step Variations of stretches in standing Gait: allowing DF and heel strike   11/26/22:  -L knee PROM -STM medial distal HS -SAQ 4# 5" hold 2x10 -Supine SLR 2# 2x10 -LAQ 7.5# 5" hld 2x10L -Sidleying hip abduction 2# 2x10 -Bridges 2x15 -incline calf stretch 30secx3 bil -squats at rail 2x10 -fwd step up 4" 2x10 with opposite march  -Sidestepping with squat GTB at ankle x2 laps at rail     PATIENT EDUCATION:  Education details: Teacher, music of condition, POC, HEP, exercise form/rationale Person educated: Patient Education method: Explanation Education comprehension: verbalized understanding  HOME EXERCISE PROGRAM: [email or web address]  ASSESSMENT:  CLINICAL IMPRESSION:  Pt denies popping/clicking/catching in knee joint and pain has moved from where it was prior. Biggest complaint is post knee pain upon standing after driving. Notable tightness in lateral gastroc addressed with DN. Hip abd strength 4-/5 combined with tendency toward a varus and hyperext knee posture leading to increased medial pressure in standing, fatigue, and spasm in gastroc after shortening in seated. Will progress POC to continue work to strengthen mentioned muscle groups. Apparent bakers cyst is significantly improved and very soft.    OBJECTIVE IMPAIRMENTS: Abnormal gait, decreased mobility, difficulty walking, decreased strength, and pain.   ACTIVITY LIMITATIONS: lifting, squatting, stairs, and transfers  PARTICIPATION LIMITATIONS: cleaning, shopping, community activity, and occupation  PERSONAL FACTORS: Time since onset of injury/illness/exacerbation are also affecting patient's functional outcome.   REHAB POTENTIAL: Good  CLINICAL DECISION MAKING: Evolving/moderate complexity  EVALUATION COMPLEXITY: Moderate   GOALS: Goals reviewed with patient?  Yes  SHORT TERM GOALS: Target date: 11/06/22 Pt will tolerate full aquatic sessions consistently without increase in pain and with improving function to demonstrate good toleration and effectiveness of intervention.   Baseline: Goal status: MET - 11/06/22  2.  Pt will tolerate stair climbing using alternating pattern ascending and descending 6 steps without use of handrail Baseline:difficult at eval.  Using reciprocal pattern, but needs rail with descending. Goal status:Partially met - 11/06/22  3.  Pt will have improved toleration to amb tolerating >10 minutes of continuous amb Baseline: 5 minutes at eval Goal status: MET -11/06/22    LONG TERM GOALS: Target date: 11/27/22  Pt to meet stated Foto Goal of 62% Baseline: 47 Goal status: ongoing  2.  Pt will be indep with final HEP's (land and aquatic as appropriate) for continued management of condition  Baseline: TBA Goal status: ongoing- land created today  3.  Pt will improve on 5 X STS test to <or=  10s to demonstrate improving functional lower extremity strength, transitional movements, and balance.  Baseline: 9 s Goal status: achieved  4.  Pt will improve left knee strength by 5-10lbs Baseline: see chart Goal status: INITIAL     PLAN:  PT FREQUENCY: 1-2x/week  PT DURATION: 6 weeks  PLANNED INTERVENTIONS: Therapeutic exercises, Therapeutic activity, Neuromuscular re-education, Balance training, Gait training, Patient/Family education, Self Care, Joint mobilization, Stair training, DME instructions, Aquatic Therapy, Dry Needling, Electrical stimulation, Cryotherapy, Moist heat, Splintting, Taping, Ultrasound, Ionotophoresis 4mg /ml Dexamethasone, Manual therapy, and Re-evaluation  PLAN FOR NEXT SESSION: strengthening hip/knee R/L, core. Retest functional tests  Shanley Furlough C. Tegan Burnside PT, DPT 01/02/23 4:58 PM  [facility] [address] [place], [place], [address] Phone:  [phone]   Fax:  [phone]

## 2023-01-05 DIAGNOSIS — R112 Nausea with vomiting, unspecified: Secondary | ICD-10-CM | POA: Insufficient documentation

## 2023-01-05 DIAGNOSIS — R1084 Generalized abdominal pain: Secondary | ICD-10-CM | POA: Insufficient documentation

## 2023-01-08 ENCOUNTER — Encounter: Payer: Self-pay | Admitting: Sports Medicine

## 2023-01-21 ENCOUNTER — Encounter (HOSPITAL_BASED_OUTPATIENT_CLINIC_OR_DEPARTMENT_OTHER): Payer: Self-pay | Admitting: Physical Therapy

## 2023-01-28 DIAGNOSIS — N958 Other specified menopausal and perimenopausal disorders: Secondary | ICD-10-CM | POA: Diagnosis not present

## 2023-01-29 ENCOUNTER — Encounter: Payer: Self-pay | Admitting: Nurse Practitioner

## 2023-02-21 DIAGNOSIS — M1712 Unilateral primary osteoarthritis, left knee: Secondary | ICD-10-CM | POA: Diagnosis not present

## 2023-03-03 ENCOUNTER — Encounter: Payer: Self-pay | Admitting: Nurse Practitioner

## 2023-03-18 DIAGNOSIS — N958 Other specified menopausal and perimenopausal disorders: Secondary | ICD-10-CM | POA: Diagnosis not present

## 2023-03-26 ENCOUNTER — Encounter: Payer: Self-pay | Admitting: Nurse Practitioner

## 2023-03-26 ENCOUNTER — Ambulatory Visit: Payer: BC Managed Care – PPO | Admitting: Nurse Practitioner

## 2023-03-26 VITALS — BP 122/80 | HR 78 | Temp 98.5°F | Ht 65.0 in | Wt 167.4 lb

## 2023-03-26 DIAGNOSIS — E1169 Type 2 diabetes mellitus with other specified complication: Secondary | ICD-10-CM

## 2023-03-26 DIAGNOSIS — E785 Hyperlipidemia, unspecified: Secondary | ICD-10-CM

## 2023-03-26 DIAGNOSIS — E782 Mixed hyperlipidemia: Secondary | ICD-10-CM | POA: Diagnosis not present

## 2023-03-26 DIAGNOSIS — Z6827 Body mass index (BMI) 27.0-27.9, adult: Secondary | ICD-10-CM

## 2023-03-26 MED ORDER — MOUNJARO 2.5 MG/0.5ML ~~LOC~~ SOAJ: 2.5000 mg | SUBCUTANEOUS | 0 refills | Status: DC

## 2023-03-26 MED ORDER — PHENTERMINE HCL 15 MG PO CAPS: 15.0000 mg | ORAL_CAPSULE | ORAL | 0 refills | Status: DC

## 2023-03-26 NOTE — Progress Notes (Signed)
Madelaine Bhat, CMA,acting as a Neurosurgeon for Arnette Felts, FNP.,have documented all relevant documentation on the behalf of Arnette Felts, FNP,as directed by  Arnette Felts, FNP while in the presence of Arnette Felts, FNP.  Subjective:  Patient ID: Yvonne [name] , female    DOB: 1971/04/08 , 52 y.o.   MRN: [id number]  Chief Complaint  Patient presents with   Diabetes   Hyperlipidemia    HPI  Patient presents today for a DM and Chol. Patient reports compliance with medication. Patient denies any chest pain, SOB, and headaches. Patient has no concerns today.  BP Readings from Last 3 Encounters: 03/26/23 : 120/84 12/19/22 : (!) 108/58 11/04/22 : (!) 145/86  Wt Readings from Last 3 Encounters: 03/26/23 : 167 lb 6.4 oz (75.9 kg) 12/19/22 : 162 lb (73.5 kg) 11/04/22 : 168 lb 12.8 oz (76.6 kg)  She is not drinking water as much as she used to. She is not refilling her water "jug" as much. She is not exercising as much. Her knee was feeling better and she felt good. She was more active. She was doing body resistance and her bike. She is going to have a total knee replacement, her last injection has worn off. She may be getting some gel to her knee. She is having more issues with resisting certain foods. She will take off one slice of bread.        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    Hx: UTI (urinary tract infection) 05/06/2011   Hyperlipidemia    Hypertension    PONV (postoperative nausea and vomiting)    Vulvitis 02/04/2011   Yeast infection 06/19/2010     Family History  Problem Relation Age of Onset   Hypertension Mother    Heart disease Mother    Diabetes Father    Alcohol abuse Father    Diabetes Brother    Hypertension Brother    Alcohol abuse Brother    Heart attack Maternal Grandfather    Stomach cancer Maternal  Grandfather    Hypertension Brother    Crohn's disease Other        mat 2nd cousin   Hyperlipidemia Other    Irritable bowel syndrome Other        mat. nephew   Colon cancer Neg Hx    Breast cancer Neg Hx    Colon polyps Neg Hx    Esophageal cancer Neg Hx    Rectal cancer Neg Hx      Current Outpatient Medications:    Adapalene 0.3 % gel, Apply 1 application topically as needed (zits). , Disp: , Rfl:    atorvastatin (LIPITOR) 40 MG tablet, TAKE 1 TABLET (40 MG TOTAL) BY MOUTH DAILY. PLEASE KEEP SCHEDULED APPOINTMENT, Disp: 90 tablet, Rfl: 3   cyclobenzaprine (FLEXERIL) 10 MG tablet, Take 10 mg by mouth 3 (three) times daily as needed., Disp: , Rfl:    dicyclomine (BENTYL) 20 MG tablet, Take 1 tablet (20 mg total) by mouth every 6 (six) hours., Disp: 30 tablet, Rfl: 0   Evolocumab (REPATHA SURECLICK) 140 MG/ML SOAJ, Inject 1 Dose into the skin every 14 (fourteen) days., Disp: 6 mL, Rfl: 3   ipratropium (ATROVENT) 0.03 % nasal spray, PLACE 1 SPRAY INTO BOTH NOSTRILS DAILY, Disp: 30 mL, Rfl: 1  Melatonin 5 MG CHEW, Chew by mouth as needed., Disp: , Rfl:    Multiple Vitamins-Minerals (CENTRUM ADULTS PO), Take 1 tablet by mouth daily., Disp: , Rfl:    Omega-3 Fatty Acids (FISH OIL PO), Take 1 capsule by mouth daily., Disp: , Rfl:    omeprazole (PRILOSEC) 40 MG capsule, Take 1 capsule (40 mg total) by mouth daily., Disp: 90 capsule, Rfl: 3   PARoxetine (PAXIL) 10 MG tablet, Take 1 tablet (10 mg total) by mouth daily., Disp: 90 tablet, Rfl: 1   phentermine 15 MG capsule, Take 1 capsule (15 mg total) by mouth every morning., Disp: 30 capsule, Rfl: 0   Sennosides (SENNA) 8.6 MG CAPS, Take 1 capsule by mouth daily as needed., Disp: , Rfl:    tirzepatide (MOUNJARO) 2.5 MG/0.5ML Pen, Inject 2.5 mg into the skin once a week., Disp: 2 mL, Rfl: 0   traMADol (ULTRAM) 50 MG tablet, Take 1-2 tablets (50-100 mg total) by mouth every 6 (six) hours as needed for severe pain., Disp: 20 tablet, Rfl: 0    valACYclovir (VALTREX) 1000 MG tablet, Take 1 tablet (1,000 mg total) by mouth every 12 (twelve) hours., Disp: 180 tablet, Rfl: 1  Current Facility-Administered Medications:    promethazine (PHENERGAN) injection 25 mg, 25 mg, Intramuscular, Q6H PRN, Arnette Felts, FNP   Allergies  Allergen Reactions   Statins Other (See Comments)    myalgias   Sulfa Antibiotics Itching     Review of Systems  Constitutional: Negative.   HENT: Negative.    Eyes: Negative.   Respiratory: Negative.    Cardiovascular: Negative.   Gastrointestinal: Negative.   Musculoskeletal:  Positive for arthralgias.  Psychiatric/Behavioral: Negative.       Today's Vitals   03/26/23 1618 03/26/23 1651  BP: 120/84 122/80  Pulse: 78   Temp: 98.5 F (36.9 C)   TempSrc: Oral   Weight: 167 lb 6.4 oz (75.9 kg)   Height: 5\' 5"  (1.651 m)   PainSc: 0-No pain    Body mass index is 27.86 kg/m.  Wt Readings from Last 3 Encounters:  03/26/23 167 lb 6.4 oz (75.9 kg)  12/19/22 162 lb (73.5 kg)  11/04/22 168 lb 12.8 oz (76.6 kg)     Objective:  Physical Exam Vitals reviewed.  Constitutional:      General: She is not in acute distress.    Appearance: Normal appearance.  Cardiovascular:     Rate and Rhythm: Normal rate and regular rhythm.     Pulses: Normal pulses.     Heart sounds: Normal heart sounds. No murmur heard. Pulmonary:     Effort: Pulmonary effort is normal. No respiratory distress.     Breath sounds: Normal breath sounds. No wheezing.  Skin:    Capillary Refill: Capillary refill takes less than 2 seconds.  Neurological:     General: No focal deficit present.     Mental Status: She is alert and oriented to person, place, and time.     Cranial Nerves: No cranial nerve deficit.     Motor: No weakness.  Psychiatric:        Mood and Affect: Mood normal.        Thought Content: Thought content normal.        Judgment: Judgment normal.         Assessment And Plan:  Type 2 diabetes mellitus with  hyperlipidemia (HCC) Assessment & Plan: Has been on Ozempic however will be switching over to [facility] this visit 2.5 mg.  Orders: -  Hemoglobin A1c  Mixed hyperlipidemia Assessment & Plan: Cholesterol levels are improving, continue Repatha.  Continue follow-up with cardiology.   BMI 27.0-27.9,adult Assessment & Plan: Encouraged to exercise regularly of at least 150 minutes/week.  Orders: -     Phentermine HCl; Take 1 capsule (15 mg total) by mouth every morning.  Dispense: 30 capsule; Refill: 0  Other orders -     Mounjaro; Inject 2.5 mg into the skin once a week.  Dispense: 2 mL; Refill: 0    No follow-ups on file.  Patient was given opportunity to ask questions. Patient verbalized understanding of the plan and was able to repeat key elements of the plan. All questions were answered to their satisfaction.    Jeanell Sparrow, FNP, have reviewed all documentation for this visit. The documentation on 03/26/23 for the exam, diagnosis, procedures, and orders are all accurate and complete.   IF YOU HAVE BEEN REFERRED TO A SPECIALIST, IT MAY TAKE 1-2 WEEKS TO SCHEDULE/PROCESS THE REFERRAL. IF YOU HAVE NOT HEARD FROM US/SPECIALIST IN TWO WEEKS, PLEASE GIVE [name] A CALL AT [phone] X [phone].

## 2023-03-27 ENCOUNTER — Other Ambulatory Visit: Payer: BC Managed Care – PPO

## 2023-03-27 DIAGNOSIS — E1169 Type 2 diabetes mellitus with other specified complication: Secondary | ICD-10-CM | POA: Diagnosis not present

## 2023-03-27 DIAGNOSIS — E785 Hyperlipidemia, unspecified: Secondary | ICD-10-CM | POA: Diagnosis not present

## 2023-04-02 DIAGNOSIS — Z6827 Body mass index (BMI) 27.0-27.9, adult: Secondary | ICD-10-CM | POA: Insufficient documentation

## 2023-04-02 NOTE — Assessment & Plan Note (Signed)
Has been on Ozempic however will be switching over to [facility] this visit 2.5 mg.

## 2023-04-02 NOTE — Assessment & Plan Note (Signed)
Encouraged to exercise regularly of at least 150 minutes/week.

## 2023-04-02 NOTE — Assessment & Plan Note (Addendum)
Cholesterol levels are improving, continue Repatha.  Continue follow-up with cardiology.

## 2023-04-03 ENCOUNTER — Other Ambulatory Visit: Payer: Self-pay | Admitting: Internal Medicine

## 2023-04-03 DIAGNOSIS — E7849 Other hyperlipidemia: Secondary | ICD-10-CM

## 2023-04-10 ENCOUNTER — Encounter: Payer: Self-pay | Admitting: Nurse Practitioner

## 2023-04-14 ENCOUNTER — Encounter: Payer: Self-pay | Admitting: Nurse Practitioner

## 2023-04-14 LAB — NMR, LIPOPROFILE

## 2023-04-15 ENCOUNTER — Other Ambulatory Visit: Payer: Self-pay

## 2023-04-15 ENCOUNTER — Other Ambulatory Visit: Payer: Self-pay | Admitting: Nurse Practitioner

## 2023-04-15 DIAGNOSIS — Z6827 Body mass index (BMI) 27.0-27.9, adult: Secondary | ICD-10-CM

## 2023-04-15 MED ORDER — MOUNJARO 2.5 MG/0.5ML ~~LOC~~ SOAJ: 2.5000 mg | SUBCUTANEOUS | 0 refills | Status: DC

## 2023-04-15 MED ORDER — PHENTERMINE HCL 15 MG PO CAPS
15.0000 mg | ORAL_CAPSULE | ORAL | 0 refills | Status: DC
Start: 2023-04-15 — End: 2023-10-07

## 2023-04-16 ENCOUNTER — Telehealth: Payer: Self-pay | Admitting: Internal Medicine

## 2023-04-16 ENCOUNTER — Other Ambulatory Visit: Payer: Self-pay | Admitting: *Deleted

## 2023-04-16 DIAGNOSIS — E7841 Elevated Lipoprotein(a): Secondary | ICD-10-CM

## 2023-04-16 DIAGNOSIS — E7849 Other hyperlipidemia: Secondary | ICD-10-CM

## 2023-04-16 NOTE — Telephone Encounter (Signed)
Patient had her Lipid drawn for her upcoming appointment. Patient received a notification that there was an issue with the lab work and needed to redo the labs. Patient is requesting for a new lab request to be submitted to [facility]. Please advise.

## 2023-04-16 NOTE — Telephone Encounter (Signed)
NMR lipoprofile re-ordered MyChart message sent to patient w/update

## 2023-04-22 ENCOUNTER — Encounter (HOSPITAL_BASED_OUTPATIENT_CLINIC_OR_DEPARTMENT_OTHER): Payer: BC Managed Care – PPO | Admitting: Internal Medicine

## 2023-04-23 DIAGNOSIS — E7841 Elevated Lipoprotein(a): Secondary | ICD-10-CM | POA: Diagnosis not present

## 2023-04-23 DIAGNOSIS — E7849 Other hyperlipidemia: Secondary | ICD-10-CM | POA: Diagnosis not present

## 2023-04-24 LAB — NMR, LIPOPROFILE
Cholesterol, Total: 145 mg/dL (ref 100–199)
HDL Particle Number: 43.6 umol/L (ref >=30.5)
HDL-C: 81 mg/dL (ref >39)
LDL Particle Number: 626 nmol/L (ref <1000)
LDL Size: 20.2 nm — ABNORMAL LOW (ref >20.5)
LDL-C (NIH Calc): 53 mg/dL (ref 0–99)
LP-IR Score: <25 (ref <=45)
Small LDL Particle Number: 293 nmol/L (ref <=527)
Triglycerides: 50 mg/dL (ref 0–149)

## 2023-05-06 ENCOUNTER — Other Ambulatory Visit: Payer: Self-pay | Admitting: Nurse Practitioner

## 2023-05-06 DIAGNOSIS — E1169 Type 2 diabetes mellitus with other specified complication: Secondary | ICD-10-CM

## 2023-05-06 NOTE — Progress Notes (Deleted)
Cardiology Office Note:  .   Date:  05/06/2023  ID:  Janeese Kinkade, DOB [birth year], MRN [id number] PCP: Arnette Felts, FNP  [facility] Providers Cardiologist:  None { Click to update primary MD,subspecialty MD or APP then REFRESH:1}   Patient Profile: .      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 Rennis Golden for management of dyslipidemia. She reported history of elevated cholesterol, however recently cholesterol trended upwards.  She also felt this was associated with weight gain and being less active due to left knee pain.  She had also been following ketogenic diet.  She reported family history of heart disease including maternal grandfather had MI in his 43s and her mother had high cholesterol and is a patient of heart care.  Her LDL at time of first visit was 202.  She was started on Repatha but unfortunately her insurance stopped covering it.  She was then placed on atorvastatin 40 mg daily.  She initially had improvement on atorvastatin but unfortunately was also found to have elevated lipoprotein a.  In September 2023 LDL was 112.  We were able to get Repatha authorized and at follow-up visit 09/30/22, she had improvement with LDL 73, HDL 69, triglycerides 61, LDL particle number 673.  Unfortunately, she was also noted to have an increase in LP(a) to 434 nmol/L.  Dr. Rennis Golden discussed the possibility of adding low-dose aspirin as there is some evidence this may help mitigate risk of elevated LP(a) and potential thrombosis.        History of Present Illness: .   Emmelie Chmelik is a *** 52 y.o. female who is here today for follow-up of dyslipidemia.   ROS: ***       Studies Reviewed: .        *** Risk Assessment/Calculations:   {Does this patient have ATRIAL FIBRILLATION?:[phone]} No BP recorded.  {Refresh Note OR Click here to enter BP  :1}***       Physical Exam:   VS:  LMP 05/25/2012    Wt  Readings from Last 3 Encounters:  03/26/23 167 lb 6.4 oz (75.9 kg)  12/19/22 162 lb (73.5 kg)  11/04/22 168 lb 12.8 oz (76.6 kg)    GEN: Well nourished, well developed in no acute distress NECK: No JVD; No carotid bruits CARDIAC: ***RRR, no murmurs, rubs, gallops RESPIRATORY:  Clear to auscultation without rales, wheezing or rhonchi  ABDOMEN: Soft, non-tender, non-distended EXTREMITIES:  No edema; No deformity     ASSESSMENT AND PLAN: .    Familial dyslipidemia: LDL-C 53, HDL 81, TG 50, LDL particle 626 on NMR 04/23/23.  Elevated Lp(a):    {Are you ordering a CV Procedure (e.g. stress test, cath, DCCV, TEE, etc)?   Press F2        :[id number]}  Dispo: ***  Signed, Eligha Bridegroom, NP-C

## 2023-05-07 ENCOUNTER — Telehealth (INDEPENDENT_AMBULATORY_CARE_PROVIDER_SITE_OTHER): Payer: BC Managed Care – PPO | Admitting: Nurse Practitioner

## 2023-05-07 ENCOUNTER — Encounter: Payer: Self-pay | Admitting: Nurse Practitioner

## 2023-05-07 ENCOUNTER — Encounter (HOSPITAL_BASED_OUTPATIENT_CLINIC_OR_DEPARTMENT_OTHER): Payer: BC Managed Care – PPO | Admitting: Nurse Practitioner

## 2023-05-07 DIAGNOSIS — U071 COVID-19: Secondary | ICD-10-CM

## 2023-05-07 MED ORDER — PAXLOVID (300/100) 20 X 150 MG & 10 X 100MG PO TBPK
3.0000 | ORAL_TABLET | Freq: Two times a day (BID) | ORAL | 0 refills | Status: AC
Start: 2023-05-07 — End: 2023-05-12

## 2023-05-07 NOTE — Progress Notes (Signed)
Virtual Visit via MyChart   This visit type was conducted due to national recommendations for restrictions regarding the COVID-19 Pandemic (e.g. social distancing) in an effort to limit this patient's exposure and mitigate transmission in our community.  Due to her co-morbid illnesses, this patient is at least at moderate risk for complications without adequate follow up.  This format is felt to be most appropriate for this patient at this time.  All issues noted in this document were discussed and addressed.  A limited physical exam was performed with this format.    This visit type was conducted due to national recommendations for restrictions regarding the COVID-19 Pandemic (e.g. social distancing) in an effort to limit this patient's exposure and mitigate transmission in our community.  Patients identity confirmed using two different identifiers.  This format is felt to be most appropriate for this patient at this time.  All issues noted in this document were discussed and addressed.  No physical exam was performed (except for noted visual exam findings with Video Visits).    Date:  05/07/2023   ID:  Yvonne [name], DOB 04-Oct-1970, MRN [id number]  Patient Location:  Home - spoke with Yvonne [name]  Provider location:   Office    Chief Complaint:  positive for covid  History of Present Illness:    Yvonne [name] is a 52 y.o. female who presents via video conferencing for a telehealth visit today.    The patient does not have symptoms concerning for COVID-19 infection (fever, chills, cough, or new shortness of breath).   Patient presents virtually today for testing positive for covid today. Patients symptoms first started yesterday with a sore throat. Patient reports she currently has sore throat, body aches, chill, sweats, nasal congestion, fatigue and cough. She has taken tylenol which has helped. She is fatigued. She is eating and drinking okay. She has been taking tylenol around the  clock.      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    Hx: UTI (urinary tract infection) 05/06/2011   Hyperlipidemia    Hypertension    PONV (postoperative nausea and vomiting)    Vulvitis 02/04/2011   Yeast infection 06/19/2010   Past Surgical History:  Procedure Laterality Date   ABDOMINAL HYSTERECTOMY     CESAREAN SECTION  2008 & 2009   COLONOSCOPY  11/24/2019   CRYOTHERAPY  2009   CYSTOSCOPY  06/15/2012   Procedure: CYSTOSCOPY;  Surgeon: Purcell Nails, MD;  Location: [facility];  Service: Gynecology;  Laterality: N/A;   KNEE ARTHROSCOPY Left 05/16/2020   Procedure: LEFT KNEE ARTHROSCOPY, PARTIAL MENISCECTOMY;  Surgeon: Marcene Corning, MD;  Location: [facility];  Service: Orthopedics;  Laterality: Left;   KNEE ARTHROSCOPY WITH MEDIAL MENISECTOMY Left 10/13/2020   Procedure: LEFT KNEE ARTHROSCOPY WITH PARTIAL MEDIAL MENISCECTOMY;  Surgeon: Tarry Kos, MD;  Location: [facility];  Service: Orthopedics;  Laterality: Left;   LAPAROSCOPIC HYSTERECTOMY  06/15/2012   Procedure: HYSTERECTOMY TOTAL LAPAROSCOPIC;  Surgeon: Purcell Nails, MD;  Location: [facility];  Service: Gynecology;  Laterality: N/A;   MYOMECTOMY  2005   TUBAL LIGATION     bilateral     Current Meds  Medication Sig   Adapalene 0.3 % gel Apply 1 application topically as needed (zits).    atorvastatin (LIPITOR)  40 MG tablet TAKE 1 TABLET (40 MG TOTAL) BY MOUTH DAILY. PLEASE KEEP SCHEDULED APPOINTMENT   cyclobenzaprine (FLEXERIL) 10 MG tablet Take 10 mg by mouth 3 (three) times daily as needed.   dicyclomine (BENTYL) 20 MG tablet Take 1 tablet (20 mg total) by mouth every 6 (six) hours.   Evolocumab (REPATHA SURECLICK) 140 MG/ML SOAJ Inject 1 Dose into the skin every 14 (fourteen) days.   ipratropium (ATROVENT) 0.03 % nasal spray PLACE 1 SPRAY INTO BOTH  NOSTRILS DAILY   Melatonin 5 MG CHEW Chew by mouth as needed.   Multiple Vitamins-Minerals (CENTRUM ADULTS PO) Take 1 tablet by mouth daily.   nirmatrelvir & ritonavir (PAXLOVID, 300/100,) 20 x 150 MG & 10 x 100MG  TBPK Take 3 tablets by mouth 2 (two) times daily for 5 days.   Omega-3 Fatty Acids (FISH OIL PO) Take 1 capsule by mouth daily.   omeprazole (PRILOSEC) 40 MG capsule Take 1 capsule (40 mg total) by mouth daily.   PARoxetine (PAXIL) 10 MG tablet Take 1 tablet (10 mg total) by mouth daily.   phentermine 15 MG capsule Take 1 capsule (15 mg total) by mouth every morning.   Sennosides (SENNA) 8.6 MG CAPS Take 1 capsule by mouth daily as needed.   tirzepatide [facility]) 2.5 MG/0.5ML Pen Inject 2.5 mg into the skin once a week.   traMADol (ULTRAM) 50 MG tablet Take 1-2 tablets (50-100 mg total) by mouth every 6 (six) hours as needed for severe pain.   valACYclovir (VALTREX) 1000 MG tablet Take 1 tablet (1,000 mg total) by mouth every 12 (twelve) hours.   Current Facility-Administered Medications for the 05/07/23 encounter (Video Visit) with Arnette Felts, FNP  Medication   promethazine (PHENERGAN) injection 25 mg     Allergies:   Statins and Sulfa antibiotics   Social History   Tobacco Use   Smoking status: Never   Smokeless tobacco: Never  Vaping Use   Vaping status: Never Used  Substance Use Topics   Alcohol use: Yes    Alcohol/week: 6.0 standard drinks of alcohol    Types: 6 Glasses of wine per week    Comment: wine daily   Drug use: No     Family Hx: The patient's family history includes Alcohol abuse in her brother and father; Crohn's disease in an other family member; Diabetes in her brother and father; Heart attack in her maternal grandfather; Heart disease in her mother; Hyperlipidemia in an other family member; Hypertension in her brother, brother, and mother; Irritable bowel syndrome in an other family member; Stomach cancer in her maternal grandfather. There is no  history of Colon cancer, Breast cancer, Colon polyps, Esophageal cancer, or Rectal cancer.  ROS:   Please see the history of present illness.    Review of Systems  Constitutional:  Positive for chills and malaise/fatigue.  HENT:  Negative for sinus pain and sore throat.   Respiratory:  Positive for cough. Negative for shortness of breath and wheezing.   Cardiovascular: Negative.   Musculoskeletal:  Positive for myalgias.  Neurological:  Negative for headaches.  Psychiatric/Behavioral: Negative.         Labs/Other Tests and Data Reviewed:    Recent Labs: 10/02/2022: TSH 1.010 12/19/2022: ALT 22; BUN 12; Creatinine, Ser 0.77; Hemoglobin 14.8; Platelets 249; Potassium 4.1; Sodium 140   Recent Lipid Panel Lab Results  Component Value Date/Time   CHOL 156 06/18/2022 11:50 AM   TRIG 65 06/18/2022 11:50 AM   HDL 83 06/18/2022 11:50 AM  CHOLHDL 1.9 06/18/2022 11:50 AM   LDLCALC 60 06/18/2022 11:50 AM    Wt Readings from Last 3 Encounters:  03/26/23 167 lb 6.4 oz (75.9 kg)  12/19/22 162 lb (73.5 kg)  11/04/22 168 lb 12.8 oz (76.6 kg)     Exam:    Vital Signs:  LMP 05/25/2012     Physical Exam Constitutional:      General: She is not in acute distress.    Appearance: Normal appearance. She is ill-appearing.  Pulmonary:     Effort: Pulmonary effort is normal. No respiratory distress.  Neurological:     General: No focal deficit present.     Mental Status: She is alert and oriented to person, place, and time. Mental status is at baseline.     Cranial Nerves: No cranial nerve deficit.  Psychiatric:        Mood and Affect: Mood and affect normal.        Behavior: Behavior normal.        Thought Content: Thought content normal.        Cognition and Memory: Memory normal.        Judgment: Judgment normal.     ASSESSMENT & PLAN:    Positive self-administered antigen test for COVID-19 Assessment & Plan: Advised patient to take Vitamin C, D, Zinc.  Keep yourself hydrated  with a lot of water and rest. Take Delsym for cough and Mucinex as need. Take Tylenol or pain reliever every 4-6 hours as needed for pain/fever/body ache. If you have elevated blood pressure, you can take OTC Corcidin. You can also take OTC oscillococcinum to help with your symptoms.  Educated patient if symptoms get worse or if she experiences any SOB, chest pain or pain in her legs to seek immediate emergency care. Continue to monitor your oxygen levels. Call us if you have any questions. Quarantine for 5 days then when going out for 5 more days wear a mask.     Orders: -     Paxlovid (300/100); Take 3 tablets by mouth 2 (two) times daily for 5 days.  Dispense: 30 tablet; Refill: 0   COVID-19 Education: The signs and symptoms of COVID-19 were discussed with the patient and how to seek care for testing (follow up with PCP or arrange E-visit).  The importance of social distancing was discussed today.  Patient Risk:   After full review of this patients clinical status, I feel that they are at least moderate risk at this time.  Time:   Today, I have spent 7.45 minutes/ seconds with the patient with telehealth technology discussing above diagnoses.     Medication Adjustments/Labs and Tests Ordered: Current medicines are reviewed at length with the patient today.  Concerns regarding medicines are outlined above.   Tests Ordered: No orders of the defined types were placed in this encounter.   Medication Changes: Meds ordered this encounter  Medications   nirmatrelvir & ritonavir (PAXLOVID, 300/100,) 20 x 150 MG & 10 x 100MG  TBPK    Sig: Take 3 tablets by mouth 2 (two) times daily for 5 days.    Dispense:  30 tablet    Refill:  0    Disposition:  Follow up prn  Signed, Arnette Felts, FNP

## 2023-05-07 NOTE — Assessment & Plan Note (Signed)
Advised patient to take Vitamin C, D, Zinc.  Keep yourself hydrated with a lot of water and rest. Take Delsym for cough and Mucinex as need. Take Tylenol or pain reliever every 4-6 hours as needed for pain/fever/body ache. If you have elevated blood pressure, you can take OTC Corcidin. You can also take OTC oscillococcinum to help with your symptoms.  Educated patient if symptoms get worse or if she experiences any SOB, chest pain or pain in her legs to seek immediate emergency care. Continue to monitor your oxygen levels. Call us if you have any questions. Quarantine for 5 days then when going out for 5 more days wear a mask.

## 2023-05-09 ENCOUNTER — Telehealth: Payer: Self-pay

## 2023-05-09 ENCOUNTER — Other Ambulatory Visit (HOSPITAL_COMMUNITY): Payer: Self-pay

## 2023-05-09 NOTE — Telephone Encounter (Signed)
Pharmacy Patient Advocate Encounter   Received notification from CoverMyMeds that prior authorization for REPATHA is required/requested.   Insurance verification completed.   The patient is insured through [facility] .   Per test claim: PA required; PA submitted to [employer] via CoverMyMeds Key/confirmation #/EOC [id number] Status is pending

## 2023-05-12 ENCOUNTER — Other Ambulatory Visit (HOSPITAL_COMMUNITY): Payer: Self-pay

## 2023-05-12 NOTE — Telephone Encounter (Signed)
Pharmacy Patient Advocate Encounter  Received notification from [facility] that Prior Authorization for REPATHA has been APPROVED from 05/09/23 to 05/08/24. Ran test claim, Copay is $35. This test claim was processed through [facility]- copay amounts may vary at other pharmacies due to pharmacy/plan contracts, or as the patient moves through the different stages of their insurance plan.

## 2023-05-19 ENCOUNTER — Other Ambulatory Visit: Payer: Self-pay | Admitting: Pharmacist

## 2023-05-19 MED ORDER — REPATHA SURECLICK 140 MG/ML ~~LOC~~ SOAJ: 140.0000 mg | SUBCUTANEOUS | 3 refills | Status: DC

## 2023-05-20 ENCOUNTER — Other Ambulatory Visit: Payer: Self-pay | Admitting: Nurse Practitioner

## 2023-05-20 DIAGNOSIS — R232 Flushing: Secondary | ICD-10-CM

## 2023-05-22 DIAGNOSIS — M1712 Unilateral primary osteoarthritis, left knee: Secondary | ICD-10-CM | POA: Diagnosis not present

## 2023-05-28 ENCOUNTER — Ambulatory Visit (HOSPITAL_BASED_OUTPATIENT_CLINIC_OR_DEPARTMENT_OTHER): Payer: BC Managed Care – PPO | Admitting: Nurse Practitioner

## 2023-05-28 ENCOUNTER — Encounter (HOSPITAL_BASED_OUTPATIENT_CLINIC_OR_DEPARTMENT_OTHER): Payer: Self-pay | Admitting: Nurse Practitioner

## 2023-05-28 VITALS — BP 112/76 | HR 88 | Ht 65.0 in | Wt 167.8 lb

## 2023-05-28 DIAGNOSIS — R011 Cardiac murmur, unspecified: Secondary | ICD-10-CM | POA: Diagnosis not present

## 2023-05-28 DIAGNOSIS — R0602 Shortness of breath: Secondary | ICD-10-CM | POA: Diagnosis not present

## 2023-05-28 DIAGNOSIS — E7841 Elevated Lipoprotein(a): Secondary | ICD-10-CM

## 2023-05-28 DIAGNOSIS — E7849 Other hyperlipidemia: Secondary | ICD-10-CM

## 2023-05-28 MED ORDER — ATORVASTATIN CALCIUM 40 MG PO TABS: 40.0000 mg | ORAL_TABLET | Freq: Every day | ORAL | 3 refills | Status: DC

## 2023-05-28 NOTE — Progress Notes (Signed)
Cardiology Office Note:  .   Date:  05/28/2023  ID:  Yvonne [name], DOB [birth year], MRN [id number] PCP: Arnette Felts, FNP  [facility] Providers Cardiologist:  None    Patient Profile: .      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 Rennis Golden for management of dyslipidemia. She reported history of elevated cholesterol, however recently cholesterol trended upwards.  She also felt this was associated with weight gain and being less active due to left knee pain.  She had also been following ketogenic diet.  She reported family history of heart disease including maternal grandfather had MI in his 30s and her mother had high cholesterol and is a patient of heart care.  Her LDL at time of first visit was 202.  She was started on Repatha but unfortunately her insurance stopped covering it.  She was then placed on atorvastatin 40 mg daily.  She initially had improvement on atorvastatin but unfortunately was also found to have elevated lipoprotein a.  In September 2023 LDL was 112.  We were able to get Repatha authorized and at follow-up visit 09/30/22, she had improvement with LDL 73, HDL 69, triglycerides 61, LDL particle number 673.  Unfortunately, she was also noted to have an increase in LP(a) to 434 nmol/L.  Dr. Rennis Golden discussed the possibility of adding low-dose aspirin as there is some evidence this may help mitigate risk of elevated LP(a) and potential thrombosis.        History of Present Illness: .   Yvonne [name] is a very pleasant 52 y.o. female who is here today for follow-up of dyslipidemia. She reports she is feeling well but suffers from chronic left knee pain. Is getting gel shots and will eventually need knee replacement. Was advised to cycle for exercise by orthopedist. Admits she has not been exercising consistently. Has gained 5 lbs since last visit. When she initially started Ozempic, she had  significant weight loss and felt that "she was locked in." Has not been as consistent recently. Cooks most meals at home, avoids processed food but sometimes eats out of boredom and is not moving as much recently. Has noted "feeling winded" with walking up 15 stairs at home. Attributes this to weight gain.   ROS: See HPI       Studies Reviewed: .         Risk Assessment/Calculations:             Physical Exam:   VS:  BP 112/76   Pulse 88   Ht 5\' 5"  (1.651 m)   Wt 167 lb 12.8 oz (76.1 kg)   LMP 05/25/2012   SpO2 99%   BMI 27.92 kg/m    Wt Readings from Last 3 Encounters:  05/28/23 167 lb 12.8 oz (76.1 kg)  03/26/23 167 lb 6.4 oz (75.9 kg)  12/19/22 162 lb (73.5 kg)    GEN: Well nourished, well developed in no acute distress NECK: No JVD; No carotid bruits CARDIAC: RRR, systolic murmur LUSB. No rubs, gallops RESPIRATORY:  Clear to auscultation without rales, wheezing or rhonchi  ABDOMEN: Soft, non-tender, non-distended EXTREMITIES:  No edema; No deformity     ASSESSMENT AND PLAN: .    Familial dyslipidemia: LDL-C 53, HDL 81, TG 50, LDL particle 626 on NMR 04/23/23. She is tolerating atorvastatin and Repatha without concerning side effects. She admits she has not been active recently, is suffering from left knee  pain. Generally, eats a healthy diet that is low in saturated fat and rare processed food but admits busyness recently has prompted more "quick meals." Continue to aim for heart healthy diet and 150 minutes of moderate intensity exercise each week.   Elevated Lp(a): Significantly elevated Lp(a) 362.4 in 05/2021 and 434.4 09/26/2022. CT calcium score of 0 on 10/20/2020. She denies chest pain, dyspnea, or other symptoms concerning for angina. No indication for further ischemic evaluation at this time. Will reach out to Dr. Rennis Golden to see if she is a candidate for upcoming clinical trial. Continue Repatha.   Murmur: She has a systolic murmur on exam. No prior awareness of murmur.  Her mother has a history of murmur. We will get echocardiogram to evaluate for structural heart disease.   Shortness of breath: Has noted recent increase in shortness of breath with going up stairs at home. Attributes this to weight gain. No orthopnea, PND, or edema. Is not exercising consistently. We are getting an echo as noted above. Will evaluate heart function on echo. Encouraged her to notify us if symptoms worsen with exertion.        Dispo: 6 months with Dr. Rennis Golden or me with labs prior  Signed, Eligha Bridegroom, NP-C

## 2023-05-28 NOTE — Patient Instructions (Signed)
Medication Instructions:   Your physician recommends that you continue on your current medications as directed. Please refer to the Current Medication list given to you today.   *If you need a refill on your cardiac medications before your next appointment, please call your pharmacy*   Lab Work:  Your physician recommends that you return for a FASTING NMR the Week of March 24. You can come in on the day of your appointment anytime between 8:00-4:30 fasting from midnight the night before.   If you have labs (blood work) drawn today and your tests are completely normal, you will receive your results only by: MyChart Message (if you have MyChart) OR A paper copy in the mail If you have any lab test that is abnormal or we need to change your treatment, we will call you to review the results.   Testing/Procedures:  Your physician has requested that you have an echocardiogram. Echocardiography is a painless test that uses sound waves to create images of your heart. It provides your doctor with information about the size and shape of your heart and how well your heart's chambers and valves are working. This procedure takes approximately one hour. There are no restrictions for this procedure. Please do NOT wear perfume or lotions (deodorant is allowed). Please arrive 15 minutes prior to your appointment time.   Follow-Up: At [facility], you and your health needs are our priority.  As part of our continuing mission to provide you with exceptional heart care, we have created designated Provider Care Teams.  These Care Teams include your primary Cardiologist (physician) and Advanced Practice Providers (APPs -  Physician Assistants and Nurse Practitioners) who all work together to provide you with the care you need, when you need it.  We recommend signing up for the patient portal called "MyChart".  Sign up information is provided on this After Visit Summary.  MyChart is used to connect  with patients for Virtual Visits (Telemedicine).  Patients are able to view lab/test results, encounter notes, upcoming appointments, etc.  Non-urgent messages can be sent to your provider as well.   To learn more about what you can do with MyChart, go to [email or web address].    Your next appointment:   6 month(s)  Provider:   Eligha Bridegroom, NP

## 2023-05-29 ENCOUNTER — Encounter: Payer: Self-pay | Admitting: Nurse Practitioner

## 2023-05-29 ENCOUNTER — Other Ambulatory Visit: Payer: Self-pay | Admitting: Nurse Practitioner

## 2023-05-29 DIAGNOSIS — E1169 Type 2 diabetes mellitus with other specified complication: Secondary | ICD-10-CM

## 2023-05-29 DIAGNOSIS — M1712 Unilateral primary osteoarthritis, left knee: Secondary | ICD-10-CM | POA: Diagnosis not present

## 2023-06-05 DIAGNOSIS — M1712 Unilateral primary osteoarthritis, left knee: Secondary | ICD-10-CM | POA: Diagnosis not present

## 2023-06-10 DIAGNOSIS — R6882 Decreased libido: Secondary | ICD-10-CM | POA: Diagnosis not present

## 2023-06-10 DIAGNOSIS — N898 Other specified noninflammatory disorders of vagina: Secondary | ICD-10-CM | POA: Diagnosis not present

## 2023-06-11 ENCOUNTER — Other Ambulatory Visit: Payer: Self-pay

## 2023-06-16 ENCOUNTER — Other Ambulatory Visit (HOSPITAL_BASED_OUTPATIENT_CLINIC_OR_DEPARTMENT_OTHER): Payer: BC Managed Care – PPO

## 2023-06-25 ENCOUNTER — Encounter: Payer: Self-pay | Admitting: Nurse Practitioner

## 2023-06-25 ENCOUNTER — Ambulatory Visit: Payer: BC Managed Care – PPO | Admitting: Nurse Practitioner

## 2023-06-25 VITALS — BP 120/80 | HR 98 | Temp 98.5°F | Ht 65.0 in | Wt 169.2 lb

## 2023-06-25 DIAGNOSIS — Z0000 Encounter for general adult medical examination without abnormal findings: Secondary | ICD-10-CM | POA: Insufficient documentation

## 2023-06-25 DIAGNOSIS — E782 Mixed hyperlipidemia: Secondary | ICD-10-CM

## 2023-06-25 DIAGNOSIS — Z79899 Other long term (current) drug therapy: Secondary | ICD-10-CM

## 2023-06-25 DIAGNOSIS — F419 Anxiety disorder, unspecified: Secondary | ICD-10-CM

## 2023-06-25 DIAGNOSIS — E1169 Type 2 diabetes mellitus with other specified complication: Secondary | ICD-10-CM | POA: Diagnosis not present

## 2023-06-25 DIAGNOSIS — F32A Depression, unspecified: Secondary | ICD-10-CM

## 2023-06-25 DIAGNOSIS — E785 Hyperlipidemia, unspecified: Secondary | ICD-10-CM | POA: Diagnosis not present

## 2023-06-25 DIAGNOSIS — E559 Vitamin D deficiency, unspecified: Secondary | ICD-10-CM

## 2023-06-25 LAB — POCT URINALYSIS DIP (CLINITEK)
Bilirubin, UA: NEGATIVE
Glucose, UA: NEGATIVE mg/dL
Ketones, POC UA: NEGATIVE mg/dL
Leukocytes, UA: NEGATIVE
Nitrite, UA: NEGATIVE
POC PROTEIN,UA: NEGATIVE
Spec Grav, UA: 1.02 (ref 1.010–1.025)
Urobilinogen, UA: 0.2 U/dL
pH, UA: 7 (ref 5.0–8.0)

## 2023-06-25 MED ORDER — SEMAGLUTIDE(0.25 OR 0.5MG/DOS) 2 MG/3ML ~~LOC~~ SOPN: 0.5000 mg | PEN_INJECTOR | SUBCUTANEOUS | 1 refills | Status: DC

## 2023-06-25 NOTE — Assessment & Plan Note (Signed)
Depression score of 5, anxiety score 4 - she is not currently on medications.

## 2023-06-25 NOTE — Assessment & Plan Note (Signed)

## 2023-06-25 NOTE — Progress Notes (Signed)
Madelaine Bhat, CMA,acting as a Neurosurgeon for Arnette Felts, FNP.,have documented all relevant documentation on the behalf of Arnette Felts, FNP,as directed by  Arnette Felts, FNP while in the presence of Arnette Felts, FNP.  Subjective:    Patient ID: Yvonne [name] , female    DOB: 1971/05/07 , 52 y.o.   MRN: [id number]  Chief Complaint  Patient presents with   Annual Exam    HPI  Patient presents today for HM, Patient reports compliance with medication. Patient denies any chest pain, SOB, or headaches. Patient has no concerns today. She had a fall getting out of her car a few weeks ago had bruising to her right lower back.   BP Readings from Last 3 Encounters: 06/25/23 : 120/80 05/28/23 : 112/76 03/26/23 : 122/80       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    Hx: UTI (urinary tract infection) 05/06/2011   Hyperlipidemia    Hypertension    PONV (postoperative nausea and vomiting)    Vulvitis 02/04/2011   Yeast infection 06/19/2010     Family History  Problem Relation Age of Onset   Hypertension Mother    Heart disease Mother    Diabetes Father    Alcohol abuse Father    Diabetes Brother    Hypertension Brother    Alcohol abuse Brother    Heart attack Maternal Grandfather    Stomach cancer Maternal Grandfather    Hypertension Brother    Crohn's disease Other        mat 2nd cousin   Hyperlipidemia Other    Irritable bowel syndrome Other        mat. nephew   Colon cancer Neg Hx    Breast cancer Neg Hx    Colon polyps Neg Hx    Esophageal cancer Neg Hx    Rectal cancer Neg Hx      Current Outpatient Medications:    Adapalene 0.3 % gel, Apply 1 application topically as needed (zits). , Disp: , Rfl:    atorvastatin (LIPITOR) 40 MG tablet, Take 1 tablet (40 mg total) by mouth daily. Please keep scheduled appointment,  Disp: 90 tablet, Rfl: 3   cyclobenzaprine (FLEXERIL) 10 MG tablet, Take 10 mg by mouth 3 (three) times daily as needed., Disp: , Rfl:    dicyclomine (BENTYL) 20 MG tablet, Take 1 tablet (20 mg total) by mouth every 6 (six) hours., Disp: 30 tablet, Rfl: 0   Evolocumab (REPATHA SURECLICK) 140 MG/ML SOAJ, Inject 140 mg into the skin every 14 (fourteen) days., Disp: 6 mL, Rfl: 3   ipratropium (ATROVENT) 0.03 % nasal spray, PLACE 1 SPRAY INTO BOTH NOSTRILS DAILY, Disp: 30 mL, Rfl: 1   Melatonin 5 MG CHEW, Chew by mouth as needed., Disp: , Rfl:    Multiple Vitamins-Minerals (CENTRUM ADULTS PO), Take 1 tablet by mouth daily., Disp: , Rfl:    Omega-3 Fatty Acids (FISH OIL PO), Take 1 capsule by mouth daily., Disp: , Rfl:    omeprazole (PRILOSEC) 40 MG capsule, Take 1 capsule (40 mg total) by mouth daily., Disp: 90 capsule, Rfl: 3   PARoxetine (PAXIL) 10 MG tablet, TAKE 1 TABLET BY MOUTH EVERY DAY, Disp: 90 tablet, Rfl: 1   phentermine 15 MG capsule, Take 1 capsule (15 mg total) by mouth every  morning., Disp: 30 capsule, Rfl: 0   Semaglutide,0.25 or 0.5MG /DOS, 2 MG/3ML SOPN, Inject 0.5 mg into the skin once a week., Disp: 9 mL, Rfl: 1   Sennosides (SENNA) 8.6 MG CAPS, Take 1 capsule by mouth daily as needed., Disp: , Rfl:    traMADol (ULTRAM) 50 MG tablet, Take 1-2 tablets (50-100 mg total) by mouth every 6 (six) hours as needed for severe pain., Disp: 20 tablet, Rfl: 0   valACYclovir (VALTREX) 1000 MG tablet, Take 1 tablet (1,000 mg total) by mouth every 12 (twelve) hours., Disp: 180 tablet, Rfl: 1  Current Facility-Administered Medications:    promethazine (PHENERGAN) injection 25 mg, 25 mg, Intramuscular, Q6H PRN, Arnette Felts, FNP   Allergies  Allergen Reactions   Statins Other (See Comments)    myalgias   Sulfa Antibiotics Itching      The patient states she uses status post hysterectomy.  Patient's last menstrual period was 05/25/2012. Negative for: breast discharge, breast lump(s), breast  pain and breast self exam. Associated symptoms include abnormal vaginal bleeding. Pertinent negatives include abnormal bleeding (hematology), anxiety, decreased libido, depression, difficulty falling sleep, dyspareunia, history of infertility, nocturia, sexual dysfunction, sleep disturbances, urinary incontinence, urinary urgency, vaginal discharge and vaginal itching. Diet regular; she is stress eating. The patient states her exercise level is none.   The patient's tobacco use is:  Social History   Tobacco Use  Smoking Status Never  Smokeless Tobacco Never   She has been exposed to passive smoke. The patient's alcohol use is:  Social History   Substance and Sexual Activity  Alcohol Use Yes   Alcohol/week: 6.0 standard drinks of alcohol   Types: 6 Glasses of wine per week   Comment: wine daily      Review of Systems  Constitutional: Negative.   HENT: Negative.    Eyes: Negative.   Respiratory: Negative.    Cardiovascular: Negative.   Gastrointestinal: Negative.   Endocrine: Negative.   Genitourinary: Negative.   Musculoskeletal: Negative.   Skin: Negative.   Allergic/Immunologic: Negative.   Neurological: Negative.   Hematological: Negative.   Psychiatric/Behavioral: Negative.        Today's Vitals   06/25/23 1105  BP: 120/80  Pulse: 98  Temp: 98.5 F (36.9 C)  TempSrc: Oral  Weight: 169 lb 3.2 oz (76.7 kg)  Height: 5\' 5"  (1.651 m)  PainSc: 0-No pain   Body mass index is 28.16 kg/m.  Wt Readings from Last 3 Encounters:  06/25/23 169 lb 3.2 oz (76.7 kg)  05/28/23 167 lb 12.8 oz (76.1 kg)  03/26/23 167 lb 6.4 oz (75.9 kg)       06/25/2023   11:07 AM 12/19/2022   10:34 AM 10/02/2022   11:38 AM 06/18/2022   11:03 AM 04/24/2022    4:14 PM  Depression screen PHQ 2/9  Decreased Interest 1 0 0 0 0  Down, Depressed, Hopeless 0 0 0 0 0  PHQ - 2 Score 1 0 0 0 0  Altered sleeping 1      Tired, decreased energy 0      Change in appetite 0      Feeling bad or  failure about yourself  1      Trouble concentrating 1      Moving slowly or fidgety/restless 1      Suicidal thoughts 0      PHQ-9 Score 5      Difficult doing work/chores Somewhat difficult          06/25/2023  11:08 AM  GAD 7 : Generalized Anxiety Score  Nervous, Anxious, on Edge 0  Control/stop worrying 0  Worry too much - different things 1  Trouble relaxing 1  Restless 0  Easily annoyed or irritable 2  Afraid - awful might happen 0  Total GAD 7 Score 4  Anxiety Difficulty Somewhat difficult    Objective:  Physical Exam Vitals reviewed.  Constitutional:      General: She is not in acute distress.    Appearance: Normal appearance. She is well-developed.  HENT:     Head: Normocephalic and atraumatic.     Right Ear: Hearing, tympanic membrane, ear canal and external ear normal. There is no impacted cerumen.     Left Ear: Hearing, tympanic membrane, ear canal and external ear normal. There is no impacted cerumen.     Nose: Nose normal.     Mouth/Throat:     Mouth: Mucous membranes are moist.  Eyes:     General: Lids are normal.     Extraocular Movements: Extraocular movements intact.     Conjunctiva/sclera: Conjunctivae normal.     Pupils: Pupils are equal, round, and reactive to light.     Funduscopic exam:    Right eye: No papilledema.        Left eye: No papilledema.  Neck:     Thyroid: No thyroid mass.     Vascular: No carotid bruit.  Cardiovascular:     Rate and Rhythm: Normal rate and regular rhythm.     Pulses: Normal pulses.     Heart sounds: Normal heart sounds. No murmur heard. Pulmonary:     Effort: Pulmonary effort is normal. No respiratory distress.     Breath sounds: Normal breath sounds. No wheezing.  Abdominal:     General: Abdomen is flat. Bowel sounds are normal. There is no distension.     Palpations: Abdomen is soft.     Tenderness: There is no abdominal tenderness.  Musculoskeletal:        General: No swelling or tenderness. Normal  range of motion.     Cervical back: Full passive range of motion without pain, normal range of motion and neck supple.     Right lower leg: No edema.     Left lower leg: No edema.  Feet:     Left foot:     Protective Sensation: 4 sites tested.    Skin:    General: Skin is warm and dry.     Capillary Refill: Capillary refill takes less than 2 seconds.  Neurological:     General: No focal deficit present.     Mental Status: She is alert and oriented to person, place, and time.     Cranial Nerves: No cranial nerve deficit.     Sensory: No sensory deficit.     Motor: No weakness.  Psychiatric:        Mood and Affect: Mood normal.        Behavior: Behavior normal.        Thought Content: Thought content normal.        Judgment: Judgment normal.         Assessment And Plan:     Encounter for annual health examination Assessment & Plan: Behavior modifications discussed and diet history reviewed.   Pt will continue to exercise regularly and modify diet with low GI, plant based foods and decrease intake of processed foods.  Recommend intake of daily multivitamin, Vitamin D, and calcium.  Recommend mammogram and  colonoscopy for preventive screenings, as well as recommend immunizations that include influenza, TDAP, and Shingles    Mixed hyperlipidemia Assessment & Plan: Cholesterol levels are improving, continue Repatha.  Continue follow-up with cardiology.  Orders: -     CMP14+EGFR -     Lipid panel  Type 2 diabetes mellitus with hyperlipidemia [facility]) Assessment & Plan: Greggory Keen was denied will send prescription for Ozempic. EKG done NSR   Orders: -     EKG 12-Lead -     POCT URINALYSIS DIP (CLINITEK) -     Microalbumin / creatinine urine ratio -     CMP14+EGFR -     Hemoglobin A1c -     Lipid panel -     Semaglutide(0.25 or 0.5MG /DOS); Inject 0.5 mg into the skin once a week.  Dispense: 9 mL; Refill: 1  Vitamin D deficiency Assessment & Plan: Will check vitamin D  level and supplement as needed.    Also encouraged to spend 15 minutes in the sun daily.    Orders: -     VITAMIN D 25 Hydroxy (Vit-D Deficiency, Fractures)  Other long term (current) drug therapy -     CBC with Differential/Platelet  Anxiety and depression Assessment & Plan: Depression score of 5, anxiety score 4 - she is not currently on medications.       Return for controlled DM check 4 months, 1 year physical. Patient was given opportunity to ask questions. Patient verbalized understanding of the plan and was able to repeat key elements of the plan. All questions were answered to their satisfaction.   Arnette Felts, FNP  I, Arnette Felts, FNP, have reviewed all documentation for this visit. The documentation on 06/25/23 for the exam, diagnosis, procedures, and orders are all accurate and complete.

## 2023-06-25 NOTE — Assessment & Plan Note (Signed)
Will check vitamin D level and supplement as needed.    Also encouraged to spend 15 minutes in the sun daily.   

## 2023-06-25 NOTE — Assessment & Plan Note (Signed)
Greggory Keen was denied will send prescription for Ozempic. EKG done NSR

## 2023-06-25 NOTE — Assessment & Plan Note (Signed)
Cholesterol levels are improving, continue Repatha.  Continue follow-up with cardiology.

## 2023-06-26 LAB — MICROALBUMIN / CREATININE URINE RATIO
Creatinine, Urine: 19.2 mg/dL
Microalb/Creat Ratio: <16 mg/g{creat} (ref 0–29)
Microalbumin, Urine: <3 ug/mL

## 2023-06-30 DIAGNOSIS — E559 Vitamin D deficiency, unspecified: Secondary | ICD-10-CM | POA: Diagnosis not present

## 2023-06-30 DIAGNOSIS — Z79899 Other long term (current) drug therapy: Secondary | ICD-10-CM | POA: Diagnosis not present

## 2023-06-30 DIAGNOSIS — E782 Mixed hyperlipidemia: Secondary | ICD-10-CM | POA: Diagnosis not present

## 2023-06-30 DIAGNOSIS — E785 Hyperlipidemia, unspecified: Secondary | ICD-10-CM | POA: Diagnosis not present

## 2023-06-30 DIAGNOSIS — E1169 Type 2 diabetes mellitus with other specified complication: Secondary | ICD-10-CM | POA: Diagnosis not present

## 2023-07-01 LAB — CBC WITH DIFFERENTIAL/PLATELET
Basophils Absolute: 0 10*3/uL (ref 0.0–0.2)
Basos: 1 %
EOS (ABSOLUTE): 0.1 10*3/uL (ref 0.0–0.4)
Eos: 2 %
Hematocrit: 44.5 % (ref 34.0–46.6)
Hemoglobin: 14.8 g/dL (ref 11.1–15.9)
Immature Grans (Abs): 0 10*3/uL (ref 0.0–0.1)
Immature Granulocytes: 0 %
Lymphocytes Absolute: 1.2 10*3/uL (ref 0.7–3.1)
Lymphs: 38 %
MCH: 30.2 pg (ref 26.6–33.0)
MCHC: 33.3 g/dL (ref 31.5–35.7)
MCV: 91 fL (ref 79–97)
Monocytes Absolute: 0.3 10*3/uL (ref 0.1–0.9)
Monocytes: 10 %
Neutrophils Absolute: 1.5 10*3/uL (ref 1.4–7.0)
Neutrophils: 49 %
Platelets: 283 10*3/uL (ref 150–450)
RBC: 4.9 x10E6/uL (ref 3.77–5.28)
RDW: 12.3 % (ref 11.7–15.4)
WBC: 3 10*3/uL — ABNORMAL LOW (ref 3.4–10.8)

## 2023-07-01 LAB — CMP14+EGFR
ALT: 28 [IU]/L (ref 0–32)
AST: 22 [IU]/L (ref 0–40)
Albumin: 4.7 g/dL (ref 3.8–4.9)
Alkaline Phosphatase: 87 [IU]/L (ref 44–121)
BUN/Creatinine Ratio: 13 (ref 9–23)
BUN: 9 mg/dL (ref 6–24)
Bilirubin Total: 0.5 mg/dL (ref 0.0–1.2)
CO2: 24 mmol/L (ref 20–29)
Calcium: 9.9 mg/dL (ref 8.7–10.2)
Chloride: 104 mmol/L (ref 96–106)
Creatinine, Ser: 0.69 mg/dL (ref 0.57–1.00)
Globulin, Total: 2.3 g/dL (ref 1.5–4.5)
Glucose: 100 mg/dL — ABNORMAL HIGH (ref 70–99)
Potassium: 5.3 mmol/L — ABNORMAL HIGH (ref 3.5–5.2)
Sodium: 141 mmol/L (ref 134–144)
Total Protein: 7 g/dL (ref 6.0–8.5)
eGFR: 103 mL/min/{1.73_m2} (ref >59)

## 2023-07-01 LAB — LIPID PANEL
Chol/HDL Ratio: 1.9 ratio (ref 0.0–4.4)
Cholesterol, Total: 163 mg/dL (ref 100–199)
HDL: 85 mg/dL (ref >39)
LDL Chol Calc (NIH): 67 mg/dL (ref 0–99)
Triglycerides: 52 mg/dL (ref 0–149)
VLDL Cholesterol Cal: 11 mg/dL (ref 5–40)

## 2023-07-01 LAB — VITAMIN D 25 HYDROXY (VIT D DEFICIENCY, FRACTURES): Vit D, 25-Hydroxy: 49.7 ng/mL (ref 30.0–100.0)

## 2023-07-01 LAB — HEMOGLOBIN A1C
Est. average glucose Bld gHb Est-mCnc: 120 mg/dL
Hgb A1c MFr Bld: 5.8 % — ABNORMAL HIGH (ref 4.8–5.6)

## 2023-07-02 ENCOUNTER — Ambulatory Visit (INDEPENDENT_AMBULATORY_CARE_PROVIDER_SITE_OTHER): Payer: BC Managed Care – PPO

## 2023-07-02 DIAGNOSIS — R011 Cardiac murmur, unspecified: Secondary | ICD-10-CM

## 2023-07-02 DIAGNOSIS — E7841 Elevated Lipoprotein(a): Secondary | ICD-10-CM

## 2023-07-02 DIAGNOSIS — E7849 Other hyperlipidemia: Secondary | ICD-10-CM

## 2023-07-02 LAB — ECHOCARDIOGRAM COMPLETE
AR max vel: 2.51 cm2
AV Area VTI: 2.35 cm2
AV Area mean vel: 2.39 cm2
AV Mean grad: 3 mm[Hg]
AV Peak grad: 5.2 mm[Hg]
Ao pk vel: 1.14 m/s
Area-P 1/2: 4.8 cm2
S' Lateral: 3.02 cm

## 2023-08-11 ENCOUNTER — Other Ambulatory Visit (HOSPITAL_BASED_OUTPATIENT_CLINIC_OR_DEPARTMENT_OTHER): Payer: Self-pay | Admitting: Nurse Practitioner

## 2023-08-11 DIAGNOSIS — Z1231 Encounter for screening mammogram for malignant neoplasm of breast: Secondary | ICD-10-CM

## 2023-08-13 DIAGNOSIS — M1712 Unilateral primary osteoarthritis, left knee: Secondary | ICD-10-CM | POA: Diagnosis not present

## 2023-09-05 DIAGNOSIS — M1712 Unilateral primary osteoarthritis, left knee: Secondary | ICD-10-CM | POA: Diagnosis not present

## 2023-09-10 ENCOUNTER — Ambulatory Visit (HOSPITAL_BASED_OUTPATIENT_CLINIC_OR_DEPARTMENT_OTHER)
Admission: RE | Admit: 2023-09-10 | Discharge: 2023-09-10 | Disposition: A | Discharge status: Home / Self Care | Payer: BC Managed Care – PPO | Source: Ambulatory Visit | Attending: Nurse Practitioner | Admitting: Nurse Practitioner

## 2023-09-10 ENCOUNTER — Encounter (HOSPITAL_BASED_OUTPATIENT_CLINIC_OR_DEPARTMENT_OTHER): Payer: Self-pay | Admitting: Radiology

## 2023-09-10 DIAGNOSIS — Z1231 Encounter for screening mammogram for malignant neoplasm of breast: Secondary | ICD-10-CM | POA: Insufficient documentation

## 2023-09-19 DIAGNOSIS — M25511 Pain in right shoulder: Secondary | ICD-10-CM | POA: Diagnosis not present

## 2023-10-07 ENCOUNTER — Ambulatory Visit: Payer: BC Managed Care – PPO | Admitting: Nurse Practitioner

## 2023-10-07 ENCOUNTER — Encounter: Payer: Self-pay | Admitting: Nurse Practitioner

## 2023-10-07 VITALS — BP 140/80 | HR 85 | Temp 98.3°F | Ht 65.0 in | Wt 173.8 lb

## 2023-10-07 DIAGNOSIS — K130 Diseases of lips: Secondary | ICD-10-CM | POA: Diagnosis not present

## 2023-10-07 DIAGNOSIS — R682 Dry mouth, unspecified: Secondary | ICD-10-CM | POA: Diagnosis not present

## 2023-10-07 DIAGNOSIS — E1169 Type 2 diabetes mellitus with other specified complication: Secondary | ICD-10-CM | POA: Diagnosis not present

## 2023-10-07 DIAGNOSIS — E785 Hyperlipidemia, unspecified: Secondary | ICD-10-CM

## 2023-10-07 MED ORDER — TESTOSTERONE 12.5 MG/ACT (1%) TD GEL: 4.0000 | Freq: Every day | TRANSDERMAL | Status: DC

## 2023-10-07 NOTE — Progress Notes (Signed)
 Madelaine Bhat, CMA,acting as a Neurosurgeon for Arnette Felts, FNP.,have documented all relevant documentation on the behalf of Arnette Felts, FNP,as directed by  Arnette Felts, FNP while in the presence of Arnette Felts, FNP.  Subjective:  Patient ID: Yvonne [name] , female    DOB: [birth year] , 53 y.o.   MRN: [id number]  Chief Complaint  Patient presents with   dry mouth    HPI  Patient presents today for dryness in her mouth. Patient reports her lips are very dry and her tongue is very dry. Patient reports she has tried OTC products. She has pain when opening her mouth in the creases. She is taking zinc, B complex supplement. She has been using vaseline. She has tried EOS dry lip repair. Neosporin and antifungal cream. Patient reports this started the first of the year. Patient is also doing DM follow up.      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    Hx: UTI (urinary tract infection) 05/06/2011   Hyperlipidemia    Hypertension    PONV (postoperative nausea and vomiting)    Vulvitis 02/04/2011   Yeast infection 06/19/2010     Family History  Problem Relation Age of Onset   Hypertension Mother    Heart disease Mother    Diabetes Father    Alcohol abuse Father    Diabetes Brother    Hypertension Brother    Alcohol abuse Brother    Heart attack Maternal Grandfather    Stomach cancer Maternal Grandfather    Hypertension Brother    Crohn's disease Other        mat 2nd cousin   Hyperlipidemia Other    Irritable bowel syndrome Other        mat. nephew   Colon cancer Neg Hx    Breast cancer Neg Hx    Colon polyps Neg Hx    Esophageal cancer Neg Hx    Rectal cancer Neg Hx      Current Outpatient Medications:    Adapalene 0.3 % gel, Apply 1 application topically as needed (zits). , Disp: , Rfl:    atorvastatin (LIPITOR) 40 MG  tablet, Take 1 tablet (40 mg total) by mouth daily. Please keep scheduled appointment, Disp: 90 tablet, Rfl: 3   cyclobenzaprine (FLEXERIL) 10 MG tablet, Take 10 mg by mouth 3 (three) times daily as needed., Disp: , Rfl:    dicyclomine (BENTYL) 20 MG tablet, Take 1 tablet (20 mg total) by mouth every 6 (six) hours., Disp: 30 tablet, Rfl: 0   Evolocumab (REPATHA SURECLICK) 140 MG/ML SOAJ, Inject 140 mg into the skin every 14 (fourteen) days., Disp: 6 mL, Rfl: 3   ipratropium (ATROVENT) 0.03 % nasal spray, PLACE 1 SPRAY INTO BOTH NOSTRILS DAILY, Disp: 30 mL, Rfl: 1   Melatonin 5 MG CHEW, Chew by mouth as needed., Disp: , Rfl:    Multiple Vitamins-Minerals (CENTRUM ADULTS PO), Take 1 tablet by mouth daily., Disp: , Rfl:    Omega-3 Fatty Acids (FISH OIL PO), Take 1 capsule by mouth daily., Disp: , Rfl:    omeprazole (PRILOSEC) 40 MG capsule, Take 1 capsule (40 mg total) by mouth daily., Disp: 90 capsule, Rfl: 3   PARoxetine (PAXIL) 10 MG tablet, TAKE 1 TABLET BY MOUTH EVERY DAY, Disp: 90 tablet, Rfl: 1  Semaglutide,0.25 or 0.5MG /DOS, 2 MG/3ML SOPN, Inject 0.5 mg into the skin once a week., Disp: 9 mL, Rfl: 1   Sennosides (SENNA) 8.6 MG CAPS, Take 1 capsule by mouth daily as needed., Disp: , Rfl:    Testosterone 12.5 MG/ACT (1%) GEL, Place 4 Pump onto the skin daily., Disp: , Rfl:    traMADol (ULTRAM) 50 MG tablet, Take 1-2 tablets (50-100 mg total) by mouth every 6 (six) hours as needed for severe pain., Disp: 20 tablet, Rfl: 0   valACYclovir (VALTREX) 1000 MG tablet, Take 1 tablet (1,000 mg total) by mouth every 12 (twelve) hours., Disp: 180 tablet, Rfl: 1   Allergies  Allergen Reactions   Statins Other (See Comments)    myalgias   Sulfa Antibiotics Itching     Review of Systems  Constitutional: Negative.   HENT: Negative.    Eyes: Negative.   Respiratory: Negative.    Cardiovascular: Negative.   Gastrointestinal: Negative.   Musculoskeletal:  Positive for arthralgias.  Neurological:  Negative.   Psychiatric/Behavioral: Negative.       Today's Vitals   10/07/23 1418  BP: (!) 140/80  Pulse: 85  Temp: 98.3 F (36.8 C)  TempSrc: Oral  Weight: 173 lb 12.8 oz (78.8 kg)  Height: 5\' 5"  (1.651 m)  PainSc: 0-No pain   Body mass index is 28.92 kg/m.  Wt Readings from Last 3 Encounters:  10/07/23 173 lb 12.8 oz (78.8 kg)  06/25/23 169 lb 3.2 oz (76.7 kg)  05/28/23 167 lb 12.8 oz (76.1 kg)      Objective:  Physical Exam Vitals reviewed.  Constitutional:      General: She is not in acute distress.    Appearance: Normal appearance.  Cardiovascular:     Rate and Rhythm: Normal rate and regular rhythm.     Pulses: Normal pulses.     Heart sounds: Normal heart sounds. No murmur heard. Pulmonary:     Effort: Pulmonary effort is normal. No respiratory distress.     Breath sounds: Normal breath sounds. No wheezing.  Skin:    General: Skin is warm.     Capillary Refill: Capillary refill takes less than 2 seconds.     Comments: Dry lips  Neurological:     General: No focal deficit present.     Mental Status: She is alert and oriented to person, place, and time.     Cranial Nerves: No cranial nerve deficit.     Motor: No weakness.  Psychiatric:        Mood and Affect: Mood normal.        Thought Content: Thought content normal.        Judgment: Judgment normal.      Assessment And Plan:  Dry mouth Assessment & Plan: Will check for metabolic cause. Encouraged to use moisturizing locking ointment to lips.   Orders: -     CBC with Differential/Platelet -     Iron, TIBC and Ferritin Panel -     TSH -     ANA, IFA (with reflex)  Dry lips -     TSH -     ANA, IFA (with reflex)  Type 2 diabetes mellitus with hyperlipidemia (HCC) Assessment & Plan: Stable, continue Ozempic.   Orders: -     Microalbumin / creatinine urine ratio -     Hemoglobin A1c -     BMP8+eGFR  Other orders -     Testosterone; Place 4 Pump onto the skin daily.   Return  for  controlled DM check 4 months.   Patient was given opportunity to ask questions. Patient verbalized understanding of the plan and was able to repeat key elements of the plan. All questions were answered to their satisfaction.   Jeanell Sparrow, FNP, have reviewed all documentation for this visit. The documentation on 10/07/23 for the exam, diagnosis, procedures, and orders are all accurate and complete.   IF YOU HAVE BEEN REFERRED TO A SPECIALIST, IT MAY TAKE 1-2 WEEKS TO SCHEDULE/PROCESS THE REFERRAL. IF YOU HAVE NOT HEARD FROM US/SPECIALIST IN TWO WEEKS, PLEASE GIVE [name] A CALL AT [phone].

## 2023-10-11 LAB — CBC WITH DIFFERENTIAL/PLATELET
Basophils Absolute: 0 10*3/uL (ref 0.0–0.2)
Basos: 1 %
EOS (ABSOLUTE): 0 10*3/uL (ref 0.0–0.4)
Eos: 0 %
Hematocrit: 46.1 % (ref 34.0–46.6)
Hemoglobin: 14.9 g/dL (ref 11.1–15.9)
Immature Grans (Abs): 0 10*3/uL (ref 0.0–0.1)
Immature Granulocytes: 0 %
Lymphocytes Absolute: 1 10*3/uL (ref 0.7–3.1)
Lymphs: 16 %
MCH: 29.6 pg (ref 26.6–33.0)
MCHC: 32.3 g/dL (ref 31.5–35.7)
MCV: 92 fL (ref 79–97)
Monocytes Absolute: 0.5 10*3/uL (ref 0.1–0.9)
Monocytes: 8 %
Neutrophils Absolute: 4.4 10*3/uL (ref 1.4–7.0)
Neutrophils: 75 %
Platelets: 294 10*3/uL (ref 150–450)
RBC: 5.04 x10E6/uL (ref 3.77–5.28)
RDW: 12.9 % (ref 11.7–15.4)
WBC: 5.9 10*3/uL (ref 3.4–10.8)

## 2023-10-11 LAB — BMP8+EGFR
BUN/Creatinine Ratio: 16 (ref 9–23)
BUN: 12 mg/dL (ref 6–24)
CO2: 24 mmol/L (ref 20–29)
Calcium: 10 mg/dL (ref 8.7–10.2)
Chloride: 104 mmol/L (ref 96–106)
Creatinine, Ser: 0.75 mg/dL (ref 0.57–1.00)
Glucose: 107 mg/dL — ABNORMAL HIGH (ref 70–99)
Potassium: 5.5 mmol/L — ABNORMAL HIGH (ref 3.5–5.2)
Sodium: 141 mmol/L (ref 134–144)
eGFR: 95 mL/min/{1.73_m2} (ref >59)

## 2023-10-11 LAB — ANTINUCLEAR ANTIBODIES, IFA: ANA Titer 1: NEGATIVE

## 2023-10-11 LAB — MICROALBUMIN / CREATININE URINE RATIO
Creatinine, Urine: 49.5 mg/dL
Microalb/Creat Ratio: 8 mg/g{creat} (ref 0–29)
Microalbumin, Urine: 4.2 ug/mL

## 2023-10-11 LAB — HEMOGLOBIN A1C
Est. average glucose Bld gHb Est-mCnc: 123 mg/dL
Hgb A1c MFr Bld: 5.9 % — ABNORMAL HIGH (ref 4.8–5.6)

## 2023-10-11 LAB — TSH: TSH: 1.44 u[IU]/mL (ref 0.450–4.500)

## 2023-10-11 LAB — IRON,TIBC AND FERRITIN PANEL
Ferritin: 162 ng/mL — ABNORMAL HIGH (ref 15–150)
Iron Saturation: 24 % (ref 15–55)
Iron: 77 ug/dL (ref 27–159)
Total Iron Binding Capacity: 320 ug/dL (ref 250–450)
UIBC: 243 ug/dL (ref 131–425)

## 2023-10-13 ENCOUNTER — Encounter: Payer: Self-pay | Admitting: Nurse Practitioner

## 2023-10-19 DIAGNOSIS — K130 Diseases of lips: Secondary | ICD-10-CM | POA: Insufficient documentation

## 2023-10-19 DIAGNOSIS — R682 Dry mouth, unspecified: Secondary | ICD-10-CM | POA: Insufficient documentation

## 2023-10-19 NOTE — Assessment & Plan Note (Signed)
 Will check for metabolic cause. Encouraged to use moisturizing locking ointment to lips.

## 2023-10-19 NOTE — Assessment & Plan Note (Signed)
 Stable, continue Ozempic.

## 2023-10-20 DIAGNOSIS — L309 Dermatitis, unspecified: Secondary | ICD-10-CM | POA: Diagnosis not present

## 2023-10-28 ENCOUNTER — Ambulatory Visit: Payer: BC Managed Care – PPO | Admitting: Nurse Practitioner

## 2023-10-30 DIAGNOSIS — R6882 Decreased libido: Secondary | ICD-10-CM | POA: Diagnosis not present

## 2023-10-30 DIAGNOSIS — R7989 Other specified abnormal findings of blood chemistry: Secondary | ICD-10-CM | POA: Diagnosis not present

## 2023-10-30 DIAGNOSIS — N958 Other specified menopausal and perimenopausal disorders: Secondary | ICD-10-CM | POA: Diagnosis not present

## 2023-10-30 DIAGNOSIS — R871 Abnormal level of hormones in specimens from female genital organs: Secondary | ICD-10-CM | POA: Diagnosis not present

## 2023-11-18 DIAGNOSIS — R011 Cardiac murmur, unspecified: Secondary | ICD-10-CM | POA: Diagnosis not present

## 2023-11-18 DIAGNOSIS — E7841 Elevated Lipoprotein(a): Secondary | ICD-10-CM | POA: Diagnosis not present

## 2023-11-18 DIAGNOSIS — E7849 Other hyperlipidemia: Secondary | ICD-10-CM | POA: Diagnosis not present

## 2023-11-19 ENCOUNTER — Encounter (HOSPITAL_BASED_OUTPATIENT_CLINIC_OR_DEPARTMENT_OTHER): Payer: Self-pay

## 2023-11-19 LAB — NMR, LIPOPROFILE
Cholesterol, Total: 158 mg/dL (ref 100–199)
HDL Particle Number: 44.5 umol/L (ref >=30.5)
HDL-C: 86 mg/dL (ref >39)
LDL Particle Number: 734 nmol/L (ref <1000)
LDL Size: 20.3 nm — ABNORMAL LOW (ref >20.5)
LDL-C (NIH Calc): 61 mg/dL (ref 0–99)
LP-IR Score: 27 (ref <=45)
Small LDL Particle Number: 310 nmol/L (ref <=527)
Triglycerides: 54 mg/dL (ref 0–149)

## 2023-11-19 NOTE — Progress Notes (Signed)
 Cardiology Office Note:  .   Date:  11/26/2023  ID:  Yvonne [name], DOB July 13, 1971, MRN [id number] PCP: Arnette Felts, FNP   HeartCare Providers Cardiologist:  None    Patient Profile: .      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 Rennis Golden for management of dyslipidemia. She reported history of elevated cholesterol, however recently cholesterol trended upwards.  She also felt this was associated with weight gain and being less active due to left knee pain.  She had also been following ketogenic diet.  She reported family history of heart disease including maternal grandfather had MI in his 68s and her mother had high cholesterol and is a patient of heart care.  Her LDL at time of first visit was 202.  She was started on Repatha but unfortunately her insurance stopped covering it.  She was then placed on atorvastatin 40 mg daily.  She initially had improvement on atorvastatin but unfortunately was also found to have elevated lipoprotein a.  In September 2023 LDL was 112.  We were able to get Repatha authorized and at follow-up visit 09/30/22, she had improvement with LDL 73, HDL 69, triglycerides 61, LDL particle number 673.  Unfortunately, she was also noted to have an increase in LP(a) to 434 nmol/L.  Dr. Rennis Golden discussed the possibility of adding low-dose aspirin as there is some evidence this may help mitigate risk of elevated LP(a) and potential thrombosis.  Seen by me on 05/28/23 for follow-up of dyslipidemia. Reported feeling well but suffers from chronic left knee pain. Is getting gel shots and will eventually need knee replacement. Was advised to cycle for exercise by orthopedist. Admits she has not been exercising consistently. Has gained 5 lbs since last visit. When she initially started Ozempic, she had significant weight loss and felt that "she was locked in." Has not been as consistent recently. Cooks  most meals at home, avoids processed food but sometimes eats out of boredom and is not moving as much recently. Has noted "feeling winded" with walking up 15 stairs at home. Attributes this to weight gain. Echo was ordered due to murmur and revealed normal LVEF 55-60%, G1 DD, no valvular disease.         History of Present Illness: .   Yvonne [name] is a very pleasant 53 y.o. female who is here today for follow-up of dyslipidemia. She reports improvement in her knee pain and is now able to move up and down stairs without difficulty. She is going to do PT to work on strengthening her leg muscles. Admits she has not been exercising as much as recommended, but is looking forward to PT. She reports some difficulty with maintaining a regular eating schedule and often forgets to eat since starting GLP-1 agonist. When she does eat, she does reach for healthier options. Is tolerating Repatha and atorvastatin without any problems. She denies chest pain, shortness of breath, palpitations, orthopnea, PND, edema, presyncope or syncope. She has noticed a slight increase in her blood pressure, but attributes this to a lack of exercise and a diet high in salt.   ROS: See HPI       Studies Reviewed: .         Risk Assessment/Calculations:             Physical Exam:   VS:  BP 118/82 (BP Location: Right Arm, Patient Position: Sitting)   Pulse 81  Ht 5\' 5"  (1.651 m)   Wt 165 lb 8 oz (75.1 kg)   LMP 05/25/2012   BMI 27.54 kg/m    Wt Readings from Last 3 Encounters:  11/26/23 165 lb 8 oz (75.1 kg)  10/07/23 173 lb 12.8 oz (78.8 kg)  06/25/23 169 lb 3.2 oz (76.7 kg)    GEN: Well nourished, well developed in no acute distress NECK: No JVD; No carotid bruits CARDIAC: RRR, systolic murmur LUSB. No rubs, gallops RESPIRATORY:  Clear to auscultation without rales, wheezing or rhonchi  ABDOMEN: Soft, non-tender, non-distended EXTREMITIES:  No edema; No deformity     ASSESSMENT AND PLAN: .    Familial  dyslipidemia: NMR 11/18/23 with LDL particle number 734, LDL-C 61, HDL-C 86, triglycerides 54, total cholesterol 158, small LDL-P 310. She is tolerating atorvastatin and Repatha without concerning side effects. Is working on increasing physical activity; scheduled for PT for leg strengthening in the setting of knee pain. Admits to decreased appetite since starting Mounjaro. Continue heart healthy diet and 150 minutes of moderate intensity exercise each week.  Continue Repatha and atorvastatin.  Elevated Lp(a): Significantly elevated Lp(a) 362.4 in 05/2021 and 434.4 09/26/2022. CT calcium score of 0 on 10/20/2020. She denies chest pain, dyspnea, or other symptoms concerning for angina. No indication for further ischemic evaluation at this time. Continue Repatha.   Murmur: Systolic murmur noted on exam 56/2130. TTE completed 07/02/23 reveals no significant valve disease, normal heart function. She is not having any concerning symptoms of valve disease.          Disposition: 1 year with Dr. Rennis Golden or me  Signed, Eligha Bridegroom, NP-C

## 2023-11-20 ENCOUNTER — Other Ambulatory Visit: Payer: Self-pay | Admitting: Nurse Practitioner

## 2023-11-20 DIAGNOSIS — R232 Flushing: Secondary | ICD-10-CM

## 2023-11-26 ENCOUNTER — Ambulatory Visit (HOSPITAL_BASED_OUTPATIENT_CLINIC_OR_DEPARTMENT_OTHER): Payer: BC Managed Care – PPO | Admitting: Nurse Practitioner

## 2023-11-26 ENCOUNTER — Encounter (HOSPITAL_BASED_OUTPATIENT_CLINIC_OR_DEPARTMENT_OTHER): Payer: Self-pay | Admitting: Nurse Practitioner

## 2023-11-26 VITALS — BP 118/82 | HR 81 | Ht 65.0 in | Wt 165.5 lb

## 2023-11-26 DIAGNOSIS — E7841 Elevated Lipoprotein(a): Secondary | ICD-10-CM | POA: Diagnosis not present

## 2023-11-26 DIAGNOSIS — E785 Hyperlipidemia, unspecified: Secondary | ICD-10-CM | POA: Diagnosis not present

## 2023-11-26 DIAGNOSIS — E7849 Other hyperlipidemia: Secondary | ICD-10-CM | POA: Diagnosis not present

## 2023-11-26 DIAGNOSIS — R011 Cardiac murmur, unspecified: Secondary | ICD-10-CM

## 2023-11-26 DIAGNOSIS — M1712 Unilateral primary osteoarthritis, left knee: Secondary | ICD-10-CM | POA: Diagnosis not present

## 2023-11-26 NOTE — Patient Instructions (Signed)
 Medication Instructions:   Your physician recommends that you continue on your current medications as directed. Please refer to the Current Medication list given to you today.   *If you need a refill on your cardiac medications before your next appointment, please call your pharmacy*  Lab Work:  None ordered.  If you have labs (blood work) drawn today and your tests are completely normal, you will receive your results only by: MyChart Message (if you have MyChart) OR A paper copy in the mail If you have any lab test that is abnormal or we need to change your treatment, we will call you to review the results.  Testing/Procedures:  None ordered.   Follow-Up: At [facility], you and your health needs are our priority.  As part of our continuing mission to provide you with exceptional heart care, our providers are all part of one team.  This team includes your primary Cardiologist (physician) and Advanced Practice Providers or APPs (Physician Assistants and Nurse Practitioners) who all work together to provide you with the care you need, when you need it.  Your next appointment:   1 year(s)  Provider:   K. Italy Hilty, MD or Eligha Bridegroom, NP    We recommend signing up for the patient portal called "MyChart".  Sign up information is provided on this After Visit Summary.  MyChart is used to connect with patients for Virtual Visits (Telemedicine).  Patients are able to view lab/test results, encounter notes, upcoming appointments, etc.  Non-urgent messages can be sent to your provider as well.   To learn more about what you can do with MyChart, go to [email or web address].   Other Instructions  Your physician wants you to follow-up in: 1 year.  You will receive a reminder letter in the mail two months in advance. If you don't receive a letter, please call our office to schedule the follow-up appointment.

## 2023-12-06 ENCOUNTER — Other Ambulatory Visit: Payer: Self-pay | Admitting: Nurse Practitioner

## 2023-12-06 DIAGNOSIS — E1169 Type 2 diabetes mellitus with other specified complication: Secondary | ICD-10-CM

## 2023-12-10 DIAGNOSIS — R7989 Other specified abnormal findings of blood chemistry: Secondary | ICD-10-CM | POA: Diagnosis not present

## 2023-12-17 ENCOUNTER — Encounter: Payer: Self-pay | Admitting: Nurse Practitioner

## 2023-12-17 DIAGNOSIS — G8929 Other chronic pain: Secondary | ICD-10-CM | POA: Diagnosis not present

## 2023-12-17 DIAGNOSIS — M25562 Pain in left knee: Secondary | ICD-10-CM | POA: Diagnosis not present

## 2024-01-05 DIAGNOSIS — H2513 Age-related nuclear cataract, bilateral: Secondary | ICD-10-CM | POA: Diagnosis not present

## 2024-01-05 DIAGNOSIS — E119 Type 2 diabetes mellitus without complications: Secondary | ICD-10-CM | POA: Diagnosis not present

## 2024-01-05 LAB — HM DIABETES EYE EXAM

## 2024-01-13 DIAGNOSIS — M25551 Pain in right hip: Secondary | ICD-10-CM | POA: Diagnosis not present

## 2024-02-11 DIAGNOSIS — E349 Endocrine disorder, unspecified: Secondary | ICD-10-CM | POA: Diagnosis not present

## 2024-02-11 DIAGNOSIS — R7989 Other specified abnormal findings of blood chemistry: Secondary | ICD-10-CM | POA: Diagnosis not present

## 2024-02-11 DIAGNOSIS — L739 Follicular disorder, unspecified: Secondary | ICD-10-CM | POA: Diagnosis not present

## 2024-02-16 ENCOUNTER — Ambulatory Visit: Payer: BC Managed Care – PPO | Admitting: Nurse Practitioner

## 2024-02-16 ENCOUNTER — Ambulatory Visit: Admitting: Nurse Practitioner

## 2024-02-16 VITALS — BP 110/70 | HR 69 | Temp 98.6°F | Ht 65.0 in | Wt 157.0 lb

## 2024-02-16 DIAGNOSIS — Z6826 Body mass index (BMI) 26.0-26.9, adult: Secondary | ICD-10-CM

## 2024-02-16 DIAGNOSIS — E663 Overweight: Secondary | ICD-10-CM

## 2024-02-16 DIAGNOSIS — E782 Mixed hyperlipidemia: Secondary | ICD-10-CM

## 2024-02-16 DIAGNOSIS — E1169 Type 2 diabetes mellitus with other specified complication: Secondary | ICD-10-CM

## 2024-02-16 DIAGNOSIS — E785 Hyperlipidemia, unspecified: Secondary | ICD-10-CM | POA: Diagnosis not present

## 2024-02-16 NOTE — Assessment & Plan Note (Addendum)
 Stable, she is no longer taking Ozempic  due to insurance coverage not approving, her A1c is in normal range.

## 2024-02-16 NOTE — Assessment & Plan Note (Signed)
Cholesterol levels are improving, continue Repatha.  Continue follow-up with cardiology.

## 2024-02-16 NOTE — Progress Notes (Signed)
 LILLETTE Kristeen JINNY Gladis, CMA,acting as a Neurosurgeon for Gaines Ada, FNP.,have documented all relevant documentation on the behalf of Gaines Ada, FNP,as directed by  Gaines Ada, FNP while in the presence of Gaines Ada, FNP.  Subjective:  Patient ID: Yvonne [name] , female    DOB: 06/16/1971 , 53 y.o.   MRN: [id number]  Chief Complaint  Patient presents with   Diabetes    Patient presents today for a chol and dm follow up, Patient reports compliance with medication. Patient denies any chest pain, SOB, or headaches. Patient has no concerns today.     Hyperlipidemia    HPI  She is on tirzepatide  from compounding company -  She continues with Repatha   Diabetes She presents for her follow-up diabetic visit. She has type 2 diabetes mellitus. Her disease course has been stable. There are no hypoglycemic associated symptoms. Pertinent negatives for hypoglycemia include no confusion, dizziness or headaches. There are no diabetic associated symptoms. Pertinent negatives for diabetes include no chest pain. There are no hypoglycemic complications. There are no diabetic complications. Risk factors for coronary artery disease include diabetes mellitus. She is following a generally healthy diet. When asked about meal planning, she reported none. She has not had a previous visit with a dietitian. (She is not checking her blood sugar. )  Hyperlipidemia Pertinent negatives include no chest pain.     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    Hx: UTI (urinary tract infection) 05/06/2011   Hyperlipidemia    Hypertension    PONV (postoperative nausea and vomiting)    Vulvitis 02/04/2011   Yeast infection 06/19/2010     Family History  Problem Relation Age of Onset   Hypertension Mother    Heart disease Mother    Diabetes Father    Alcohol abuse Father     Diabetes Brother    Hypertension Brother    Alcohol abuse Brother    Heart attack Maternal Grandfather    Stomach cancer Maternal Grandfather    Hypertension Brother    Crohn's disease Other        mat 2nd cousin   Hyperlipidemia Other    Irritable bowel syndrome Other        mat. nephew   Colon cancer Neg Hx    Breast cancer Neg Hx    Colon polyps Neg Hx    Esophageal cancer Neg Hx    Rectal cancer Neg Hx      Current Outpatient Medications:    Adapalene 0.3 % gel, Apply 1 application topically as needed (zits). , Disp: , Rfl:    atorvastatin  (LIPITOR) 40 MG tablet, Take 1 tablet (40 mg total) by mouth daily. Please keep scheduled appointment, Disp: 90 tablet, Rfl: 3   Evolocumab  (REPATHA  SURECLICK) 140 MG/ML SOAJ, Inject 140 mg into the skin every 14 (fourteen) days., Disp: 6 mL, Rfl: 3   Melatonin 5 MG CHEW, Chew by mouth as needed., Disp: , Rfl:    MELOXICAM PO, Take by mouth every 6 (six) hours as needed., Disp: , Rfl:    Multiple Vitamins-Minerals (CENTRUM ADULTS PO), Take 1 tablet by mouth daily., Disp: , Rfl:    Omega-3 Fatty Acids (FISH OIL PO), Take 1 capsule by mouth daily., Disp: , Rfl:    omeprazole  (PRILOSEC ) 40 MG capsule, Take 1  capsule (40 mg total) by mouth daily., Disp: 90 capsule, Rfl: 3   PARoxetine  (PAXIL ) 10 MG tablet, TAKE 1 TABLET BY MOUTH EVERY DAY, Disp: 90 tablet, Rfl: 1   Sennosides (SENNA) 8.6 MG CAPS, Take 1 capsule by mouth daily as needed., Disp: , Rfl:    tirzepatide  (MOUNJARO ) 15 MG/0.5ML Pen, Inject 30 mg into the skin once a week., Disp: , Rfl:    valACYclovir  (VALTREX ) 1000 MG tablet, Take 1 tablet (1,000 mg total) by mouth every 12 (twelve) hours., Disp: 180 tablet, Rfl: 1   Allergies  Allergen Reactions   Statins Other (See Comments)    myalgias   Sulfa Antibiotics Itching     Review of Systems  Constitutional: Negative.   HENT: Negative.    Eyes: Negative.   Respiratory: Negative.    Cardiovascular: Negative.  Negative for chest  pain.  Gastrointestinal: Negative.   Neurological:  Negative for dizziness and headaches.  Psychiatric/Behavioral:  Negative for confusion.      Today's Vitals   02/16/24 1504  BP: 110/70  Pulse: 69  Temp: 98.6 F (37 C)  TempSrc: Oral  Weight: 157 lb (71.2 kg)  Height: 5' 5 (1.651 m)  PainSc: 0-No pain   Body mass index is 26.13 kg/m.  Wt Readings from Last 3 Encounters:  02/16/24 157 lb (71.2 kg)  11/26/23 165 lb 8 oz (75.1 kg)  10/07/23 173 lb 12.8 oz (78.8 kg)     Objective:  Physical Exam Vitals and nursing note reviewed.  Constitutional:      General: She is not in acute distress.    Appearance: Normal appearance.   Cardiovascular:     Rate and Rhythm: Normal rate and regular rhythm.     Pulses: Normal pulses.     Heart sounds: Normal heart sounds. No murmur heard. Pulmonary:     Effort: Pulmonary effort is normal. No respiratory distress.     Breath sounds: Normal breath sounds. No wheezing.   Skin:    General: Skin is warm and dry.     Capillary Refill: Capillary refill takes less than 2 seconds.   Neurological:     General: No focal deficit present.     Mental Status: She is alert and oriented to person, place, and time.     Cranial Nerves: No cranial nerve deficit.     Motor: No weakness.   Psychiatric:        Mood and Affect: Mood normal.        Thought Content: Thought content normal.        Judgment: Judgment normal.         Assessment And Plan:  Type 2 diabetes mellitus with hyperlipidemia (HCC) Assessment & Plan: Stable, she is no longer taking Ozempic  due to insurance coverage not approving, her A1c is in normal range.   Orders: -     Hemoglobin A1c -     Lipid panel -     BMP8+eGFR  Overweight with body mass index (BMI) of 26 to 26.9 in adult Assessment & Plan: She has lost approximately 8 lbs since her last visit. She is taking tirzepatide  compound this is in Epic under historical    Mixed hyperlipidemia Assessment &  Plan: Cholesterol levels are improving, continue Repatha .  Continue follow-up with cardiology.     Return for keep same next.  Patient was given opportunity to ask questions. Patient verbalized understanding of the plan and was able to repeat key elements of the plan. All questions  were answered to their satisfaction.    LILLETTE Gaines Ada, FNP, have reviewed all documentation for this visit. The documentation on 02/16/24 for the exam, diagnosis, procedures, and orders are all accurate and complete.   IF YOU HAVE BEEN REFERRED TO A SPECIALIST, IT MAY TAKE 1-2 WEEKS TO SCHEDULE/PROCESS THE REFERRAL. IF YOU HAVE NOT HEARD FROM US /SPECIALIST IN TWO WEEKS, PLEASE GIVE US  A CALL AT [phone] X [phone].

## 2024-02-17 ENCOUNTER — Ambulatory Visit: Payer: Self-pay | Admitting: Nurse Practitioner

## 2024-02-17 LAB — BMP8+EGFR
BUN/Creatinine Ratio: 15 (ref 9–23)
BUN: 12 mg/dL (ref 6–24)
CO2: 23 mmol/L (ref 20–29)
Calcium: 10.1 mg/dL (ref 8.7–10.2)
Chloride: 103 mmol/L (ref 96–106)
Creatinine, Ser: 0.78 mg/dL (ref 0.57–1.00)
Glucose: 80 mg/dL (ref 70–99)
Potassium: 4.9 mmol/L (ref 3.5–5.2)
Sodium: 141 mmol/L (ref 134–144)
eGFR: 90 mL/min/{1.73_m2} (ref >59)

## 2024-02-17 LAB — HEMOGLOBIN A1C
Est. average glucose Bld gHb Est-mCnc: 105 mg/dL
Hgb A1c MFr Bld: 5.3 % (ref 4.8–5.6)

## 2024-02-17 LAB — LIPID PANEL
Chol/HDL Ratio: 2.1 ratio (ref 0.0–4.4)
Cholesterol, Total: 158 mg/dL (ref 100–199)
HDL: 75 mg/dL (ref >39)
LDL Chol Calc (NIH): 70 mg/dL (ref 0–99)
Triglycerides: 66 mg/dL (ref 0–149)
VLDL Cholesterol Cal: 13 mg/dL (ref 5–40)

## 2024-02-19 DIAGNOSIS — N951 Menopausal and female climacteric states: Secondary | ICD-10-CM | POA: Diagnosis not present

## 2024-02-19 DIAGNOSIS — R6882 Decreased libido: Secondary | ICD-10-CM | POA: Diagnosis not present

## 2024-02-19 DIAGNOSIS — N958 Other specified menopausal and perimenopausal disorders: Secondary | ICD-10-CM | POA: Diagnosis not present

## 2024-02-19 DIAGNOSIS — R7989 Other specified abnormal findings of blood chemistry: Secondary | ICD-10-CM | POA: Diagnosis not present

## 2024-02-22 DIAGNOSIS — E663 Overweight: Secondary | ICD-10-CM | POA: Insufficient documentation

## 2024-02-22 NOTE — Assessment & Plan Note (Addendum)
 She has lost approximately 8 lbs since her last visit. She is taking tirzepatide  compound this is in Epic under historical

## 2024-03-09 DIAGNOSIS — M25511 Pain in right shoulder: Secondary | ICD-10-CM | POA: Diagnosis not present

## 2024-03-11 DIAGNOSIS — N958 Other specified menopausal and perimenopausal disorders: Secondary | ICD-10-CM | POA: Diagnosis not present

## 2024-03-11 DIAGNOSIS — R6882 Decreased libido: Secondary | ICD-10-CM | POA: Diagnosis not present

## 2024-03-11 DIAGNOSIS — N951 Menopausal and female climacteric states: Secondary | ICD-10-CM | POA: Diagnosis not present

## 2024-03-11 DIAGNOSIS — Z79890 Hormone replacement therapy (postmenopausal): Secondary | ICD-10-CM | POA: Diagnosis not present

## 2024-03-25 ENCOUNTER — Encounter: Payer: Self-pay | Admitting: Nurse Practitioner

## 2024-04-05 ENCOUNTER — Other Ambulatory Visit (HOSPITAL_BASED_OUTPATIENT_CLINIC_OR_DEPARTMENT_OTHER): Payer: Self-pay | Admitting: *Deleted

## 2024-04-05 ENCOUNTER — Encounter (HOSPITAL_BASED_OUTPATIENT_CLINIC_OR_DEPARTMENT_OTHER): Payer: Self-pay | Admitting: Internal Medicine

## 2024-04-05 MED ORDER — REPATHA SURECLICK 140 MG/ML ~~LOC~~ SOAJ: 140.0000 mg | SUBCUTANEOUS | 2 refills | Status: AC

## 2024-04-05 MED ORDER — ATORVASTATIN CALCIUM 40 MG PO TABS: 40.0000 mg | ORAL_TABLET | Freq: Every day | ORAL | 2 refills | Status: AC

## 2024-04-13 DIAGNOSIS — M25511 Pain in right shoulder: Secondary | ICD-10-CM | POA: Diagnosis not present

## 2024-04-19 DIAGNOSIS — M25511 Pain in right shoulder: Secondary | ICD-10-CM | POA: Diagnosis not present

## 2024-04-28 ENCOUNTER — Encounter: Payer: Self-pay | Admitting: Nurse Practitioner

## 2024-04-29 ENCOUNTER — Encounter: Payer: Self-pay | Admitting: Family Medicine

## 2024-04-29 ENCOUNTER — Ambulatory Visit: Payer: Self-pay | Admitting: Family Medicine

## 2024-04-29 VITALS — BP 110/60 | HR 74 | Temp 98.2°F | Ht 65.0 in | Wt 152.0 lb

## 2024-04-29 DIAGNOSIS — Z01818 Encounter for other preprocedural examination: Secondary | ICD-10-CM

## 2024-04-29 DIAGNOSIS — E1169 Type 2 diabetes mellitus with other specified complication: Secondary | ICD-10-CM | POA: Diagnosis not present

## 2024-04-29 DIAGNOSIS — E785 Hyperlipidemia, unspecified: Secondary | ICD-10-CM | POA: Diagnosis not present

## 2024-04-29 DIAGNOSIS — E782 Mixed hyperlipidemia: Secondary | ICD-10-CM | POA: Diagnosis not present

## 2024-04-29 DIAGNOSIS — Z2821 Immunization not carried out because of patient refusal: Secondary | ICD-10-CM

## 2024-04-29 LAB — CBC
Hematocrit: 44.7 % (ref 34.0–46.6)
Hemoglobin: 15 g/dL (ref 11.1–15.9)
MCH: 31.4 pg (ref 26.6–33.0)
MCHC: 33.6 g/dL (ref 31.5–35.7)
MCV: 94 fL (ref 79–97)
Platelets: 284 x10E3/uL (ref 150–450)
RBC: 4.78 x10E6/uL (ref 3.77–5.28)
RDW: 12.5 % (ref 11.7–15.4)
WBC: 3.8 x10E3/uL (ref 3.4–10.8)

## 2024-04-29 LAB — BASIC METABOLIC PANEL WITH GFR
BUN/Creatinine Ratio: 12 (ref 9–23)
BUN: 8 mg/dL (ref 6–24)
CO2: 24 mmol/L (ref 20–29)
Calcium: 10 mg/dL (ref 8.7–10.2)
Chloride: 105 mmol/L (ref 96–106)
Creatinine, Ser: 0.69 mg/dL (ref 0.57–1.00)
Glucose: 99 mg/dL (ref 70–99)
Potassium: 5.3 mmol/L — ABNORMAL HIGH (ref 3.5–5.2)
Sodium: 143 mmol/L (ref 134–144)
eGFR: 102 mL/min/1.73 (ref >59)

## 2024-04-29 NOTE — Patient Instructions (Signed)

## 2024-04-29 NOTE — Progress Notes (Signed)
 I,Jameka J Llittleton, CMA,acting as a neurosurgeon for [employer], NP.,have documented all relevant documentation on the behalf of Bruna Creighton, NP,as directed by  Bruna Creighton, NP while in the presence of Bruna Creighton, NP.  Subjective:  Patient ID: Yvonne [name] , female    DOB: [birth year] , 53 y.o.   MRN: [id number]  Chief Complaint  Patient presents with   Pre-op Exam    Patient presents today for  pre op exam. She is going to have right shoulder surgery.     HPI Discussed the use of AI scribe software for clinical note transcription with the patient, who gave verbal consent to proceed.  History of Present Illness     Yvonne [name] is a 53 year old female who presents for pre-clearance for right shoulder rotator cuff tendon repair surgery.  The patient reports that she has been told she has a tear in her right shoulder rotator cuff, with one tendon being seventy-five percent gone. The pain began approximately a year to a year and a half ago and has progressively worsened. She has received three injections in the shoulder over the past year; the first injection provided significant relief, but the subsequent injections have not been effective.  She has a history of knee surgery performed approximately five years ago, which was successful, and her knee is currently doing well. She mentions the possibility of needing further knee intervention in the future but is currently able to walk without significant issues.  She is currently on Repatha  every two weeks and atorvastatin , which she tolerates well without side effects. She has a genetic predisposition to elevated lipoprotein levels, as her mother has the same condition.  She has received her annual flu shot but has not had a pneumonia shot. No swelling of the ankles and she is able to climb stairs without shortness of breath.  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    Hx: UTI (urinary tract infection) 05/06/2011   Hyperlipidemia    Hypertension    PONV (postoperative nausea and vomiting)    Vulvitis 02/04/2011   Yeast infection 06/19/2010     Family History  Problem Relation Age of Onset   Hypertension Mother    Heart disease Mother    Diabetes Father    Alcohol abuse Father    Diabetes Brother    Hypertension Brother    Alcohol abuse Brother    Heart attack Maternal Grandfather    Stomach cancer Maternal Grandfather    Hypertension Brother    Crohn's disease Other        mat 2nd cousin   Hyperlipidemia Other    Irritable bowel syndrome Other        mat. nephew   Colon cancer Neg Hx    Breast cancer Neg Hx    Colon polyps Neg Hx    Esophageal cancer Neg Hx    Rectal cancer Neg Hx      Current Outpatient Medications:    Adapalene 0.3 % gel, Apply 1 application topically as needed (zits). , Disp: , Rfl:    atorvastatin  (LIPITOR) 40 MG tablet, Take 1 tablet (40 mg total) by mouth daily., Disp: 90 tablet, Rfl: 2  Evolocumab  (REPATHA  SURECLICK) 140 MG/ML SOAJ, Inject 140 mg into the skin every 14 (fourteen) days., Disp: 6 mL, Rfl: 2   MELOXICAM PO, Take 15 mg by mouth every 6 (six) hours as needed., Disp: , Rfl:    Multiple Vitamins-Minerals (CENTRUM ADULTS PO), Take 1 tablet by mouth daily., Disp: , Rfl:    omeprazole  (PRILOSEC ) 40 MG capsule, Take 1 capsule (40 mg total) by mouth daily., Disp: 90 capsule, Rfl: 3   PARoxetine  (PAXIL ) 10 MG tablet, TAKE 1 TABLET BY MOUTH EVERY DAY, Disp: 90 tablet, Rfl: 1   Sennosides (SENNA) 8.6 MG CAPS, Take 1 capsule by mouth daily as needed., Disp: , Rfl:    tirzepatide  (MOUNJARO ) 15 MG/0.5ML Pen, Inject 30 mg into the skin once a week., Disp: , Rfl:    valACYclovir  (VALTREX ) 1000 MG tablet, Take 1 tablet (1,000 mg total) by mouth  every 12 (twelve) hours., Disp: 180 tablet, Rfl: 1   Melatonin 5 MG CHEW, Chew by mouth as needed. (Patient not taking: Reported on 04/29/2024), Disp: , Rfl:    Omega-3 Fatty Acids (FISH OIL PO), Take 1 capsule by mouth daily., Disp: , Rfl:    Allergies  Allergen Reactions   Statins Other (See Comments)    myalgias   Sulfa Antibiotics Itching     Review of Systems  Constitutional: Negative.   HENT: Negative.    Respiratory: Negative.    Cardiovascular: Negative.   Gastrointestinal: Negative.   Genitourinary: Negative.   Musculoskeletal:  Positive for arthralgias.  Skin: Negative.   Neurological: Negative.   Psychiatric/Behavioral: Negative.       Today's Vitals   04/29/24 1140  BP: 110/60  Pulse: 74  Temp: 98.2 F (36.8 C)  TempSrc: Oral  Weight: 152 lb (68.9 kg)  Height: 5' 5 (1.651 m)  PainSc: 0-No pain   Body mass index is 25.29 kg/m.  Wt Readings from Last 3 Encounters:  04/29/24 152 lb (68.9 kg)  02/16/24 157 lb (71.2 kg)  11/26/23 165 lb 8 oz (75.1 kg)    The 10-year ASCVD risk score (Arnett DK, et al., 2019) is: 2.7%   Values used to calculate the score:     Age: 8 years     Clincally relevant sex: Female     Is Non-Hispanic African American: Yes     Diabetic: Yes     Tobacco smoker: No     Systolic Blood Pressure: 110 mmHg     Is BP treated: No     HDL Cholesterol: 75 mg/dL     Total Cholesterol: 158 mg/dL  Objective:  Physical Exam HENT:     Head: Normocephalic.  Cardiovascular:     Rate and Rhythm: Normal rate and regular rhythm.  Pulmonary:     Effort: Pulmonary effort is normal.     Breath sounds: Normal breath sounds.  Musculoskeletal:        General: Tenderness present.  Neurological:     General: No focal deficit present.     Mental Status: She is alert and oriented to person, place, and time.         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 Previous corticosteroid injections ineffective. Preoperative clearance needed for surgery. - Proceed with preoperative clearance for rotator cuff repair surgery. - Order CBC and BMP.  Type 2 diabetes mellitus Type 2 diabetes mellitus is being monitored.  Mixed hyperlipidemia Managed with atorvastatin  and Repatha . No side effects reported. Cholesterol checked in June 2025.   Return for move physical  to dec .  Patient was given opportunity to ask questions. Patient verbalized understanding of the plan and was able to repeat key elements of the plan. All questions were answered to their satisfaction.    I, Bruna Creighton, NP, have reviewed all documentation for this visit. The documentation on 04/30/2024 for the exam, diagnosis, procedures, and orders are all accurate and complete.    IF YOU HAVE BEEN REFERRED TO A SPECIALIST, IT MAY TAKE 1-2 WEEKS TO SCHEDULE/PROCESS THE REFERRAL. IF YOU HAVE NOT HEARD FROM US /SPECIALIST IN TWO WEEKS, PLEASE GIVE US  A CALL AT [phone].

## 2024-04-30 ENCOUNTER — Ambulatory Visit: Payer: Self-pay | Admitting: Family Medicine

## 2024-04-30 NOTE — Assessment & Plan Note (Signed)
 Type 2 diabetes mellitus is being monitored, on mounjaro  15 mg SQ weekly   -last A1c on 02/16/2024, 5.3

## 2024-04-30 NOTE — Progress Notes (Signed)
 Your labs are fine but your potassium is borderline elevated. Drink water  for proper hydration because dehydration can lead to increased potassium. Your labs are fine but your potassium is borderline elevated. Drink water  for proper hydration because dehydration can lead to increased potassium.  Thank you!

## 2024-05-03 ENCOUNTER — Ambulatory Visit: Payer: Self-pay | Admitting: Nurse Practitioner

## 2024-05-19 DIAGNOSIS — M1712 Unilateral primary osteoarthritis, left knee: Secondary | ICD-10-CM | POA: Diagnosis not present

## 2024-06-10 DIAGNOSIS — M25562 Pain in left knee: Secondary | ICD-10-CM | POA: Diagnosis not present

## 2024-06-18 ENCOUNTER — Other Ambulatory Visit (HOSPITAL_BASED_OUTPATIENT_CLINIC_OR_DEPARTMENT_OTHER): Payer: Self-pay | Admitting: Nurse Practitioner

## 2024-06-19 ENCOUNTER — Encounter (HOSPITAL_BASED_OUTPATIENT_CLINIC_OR_DEPARTMENT_OTHER): Payer: Self-pay

## 2024-06-29 ENCOUNTER — Encounter: Payer: Self-pay | Admitting: Nurse Practitioner

## 2024-07-02 DIAGNOSIS — M25511 Pain in right shoulder: Secondary | ICD-10-CM | POA: Diagnosis not present

## 2024-07-23 ENCOUNTER — Telehealth: Admitting: Family Medicine

## 2024-07-23 DIAGNOSIS — B009 Herpesviral infection, unspecified: Secondary | ICD-10-CM | POA: Diagnosis not present

## 2024-07-23 MED ORDER — VALACYCLOVIR HCL 1 G PO TABS: 1000.0000 mg | ORAL_TABLET | Freq: Two times a day (BID) | ORAL | 0 refills | Status: DC

## 2024-07-23 NOTE — Progress Notes (Signed)
 Virtual Visit Consent   Yvonne [name], you are scheduled for a virtual visit with a [facility] provider today. Just as with appointments in the office, your consent must be obtained to participate. Your consent will be active for this visit and any virtual visit you may have with one of our providers in the next 365 days. If you have a MyChart account, a copy of this consent can be sent to you electronically.  As this is a virtual visit, video technology does not allow for your provider to perform a traditional examination. This may limit your provider's ability to fully assess your condition. If your provider identifies any concerns that need to be evaluated in person or the need to arrange testing (such as labs, EKG, etc.), we will make arrangements to do so. Although advances in technology are sophisticated, we cannot ensure that it will always work on either your end or our end. If the connection with a video visit is poor, the visit may have to be switched to a telephone visit. With either a video or telephone visit, we are not always able to ensure that we have a secure connection.  By engaging in this virtual visit, you consent to the provision of healthcare and authorize for your insurance to be billed (if applicable) for the services provided during this visit. Depending on your insurance coverage, you may receive a charge related to this service.  I need to obtain your verbal consent now. Are you willing to proceed with your visit today? Yvonne [name] has provided verbal consent on 07/23/2024 for a virtual visit (video or telephone). [name], [place]  Date: 07/23/2024 11:04 AM   Virtual Visit via Video Note   I, [name], connected with  Yvonne [name]  ([id number], February 04, 1971) on 07/23/24 at 11:00 AM EST by a video-enabled telemedicine application and verified that I am speaking with the correct person using two identifiers.  Location: Patient: Virtual Visit Location Patient:  Home Provider: Virtual Visit Location Provider: Home Office   I discussed the limitations of evaluation and management by telemedicine and the availability of in person appointments. The patient expressed understanding and agreed to proceed.    History of Present Illness: Yvonne [name] is a 53 y.o. who identifies as a female who was assigned female at birth, and is being seen today for c/o is requesting medication refills for Valtrex .  Pt states she seldom uses it and states her PCP office is closed and is wanting to make sure she gets her medication. Pt states she has ran out of the medication but does not want to have an outbreak.   HPI: HPI  Problems:  Patient Active Problem List   Diagnosis Date Noted   Overweight with body mass index (BMI) of 26 to 26.9 in adult 02/22/2024   Dry mouth 10/19/2023   Dry lips 10/19/2023   Encounter for annual health examination 06/25/2023   Vitamin D  deficiency 06/25/2023   Anxiety and depression 06/25/2023   Positive self-administered antigen test for COVID-19 05/07/2023   BMI 27.0-27.9,adult 04/02/2023   Type 2 diabetes mellitus with hyperlipidemia (HCC) 03/26/2023   Nausea and vomiting 01/05/2023   Generalized abdominal pain 01/05/2023   Type 2 diabetes mellitus (HCC) 11/11/2022   Pain in left foot 12/24/2021   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 , Disp: , Rfl:    atorvastatin  (LIPITOR) 40 MG tablet, Take 1 tablet (40 mg total) by  mouth daily., Disp: 90 tablet, Rfl: 2   Evolocumab  (REPATHA  SURECLICK) 140 MG/ML SOAJ, Inject 140 mg into the skin every 14 (fourteen) days., Disp: 6 mL, Rfl: 2   Melatonin 5 MG CHEW, Chew by mouth as needed. (Patient not taking: Reported on 04/29/2024), Disp: , Rfl:    MELOXICAM PO, Take 15 mg by mouth every 6 (six) hours as needed., Disp: , Rfl:    Multiple Vitamins-Minerals (CENTRUM ADULTS PO), Take 1 tablet by mouth daily., Disp: , Rfl:    Omega-3 Fatty Acids (FISH OIL PO), Take 1 capsule by mouth daily., Disp: , Rfl:    omeprazole  (PRILOSEC ) 40 MG capsule, Take 1 capsule (40 mg total) by mouth daily., Disp: 90 capsule, Rfl: 3   PARoxetine  (PAXIL ) 10 MG tablet, TAKE 1 TABLET BY MOUTH EVERY DAY, Disp: 90 tablet, Rfl: 1   Sennosides (SENNA) 8.6 MG CAPS, Take 1 capsule by mouth daily as needed., Disp: , Rfl:    tirzepatide  (MOUNJARO ) 15 MG/0.5ML Pen, Inject 30 mg into the skin once a week., Disp: , Rfl:    valACYclovir  (VALTREX ) 1000 MG tablet, Take 1 tablet (1,000 mg total) by mouth every 12 (twelve) hours for 15 days., Disp: 30 tablet, Rfl: 0  Observations/Objective: Patient is well-developed, well-nourished in no acute distress.  Resting comfortably at home.  Head is normocephalic, atraumatic.  No labored breathing. Speech is clear and coherent with logical content.  Patient is alert and oriented at baseline.    Assessment and Plan: 1. Herpes (Primary) - valACYclovir  (VALTREX ) 1000 MG tablet; Take 1 tablet (1,000 mg total) by mouth every 12 (twelve) hours for 15 days.  Dispense: 30 tablet; Refill: 0  -Medication refilled.  Pt to follow up with PCP for further refills.   Follow Up Instructions: I discussed the assessment and treatment plan with the patient. The patient was provided an opportunity to ask questions and all were answered. The patient agreed with the plan and demonstrated an understanding of the instructions.  A copy of instructions were sent to the patient via MyChart  unless otherwise noted below.    The patient was advised to call back or seek an in-person evaluation if the symptoms worsen or if the condition fails to improve as anticipated.    [name], PA-C

## 2024-07-23 NOTE — Patient Instructions (Signed)
  Montie Portugal, thank you for joining [name], PA-C for today's virtual visit.  While this provider is not your primary care provider (PCP), if your PCP is located in our provider database this encounter information will be shared with them immediately following your visit.   A [facility] MyChart account gives you access to today's visit and all your visits, tests, and labs performed at [facility]  click here if you don't have a [facility] MyChart account or go to [email or web address]  Consent: (Patient) Yvonne [name] provided verbal consent for this virtual visit at the beginning of the encounter.  Current Medications:  Current Outpatient Medications:    Adapalene 0.3 % gel, Apply 1 application topically as needed (zits). , Disp: , Rfl:    atorvastatin  (LIPITOR) 40 MG tablet, Take 1 tablet (40 mg total) by mouth daily., Disp: 90 tablet, Rfl: 2   Evolocumab  (REPATHA  SURECLICK) 140 MG/ML SOAJ, Inject 140 mg into the skin every 14 (fourteen) days., Disp: 6 mL, Rfl: 2   Melatonin 5 MG CHEW, Chew by mouth as needed. (Patient not taking: Reported on 04/29/2024), Disp: , Rfl:    MELOXICAM PO, Take 15 mg by mouth every 6 (six) hours as needed., Disp: , Rfl:    Multiple Vitamins-Minerals (CENTRUM ADULTS PO), Take 1 tablet by mouth daily., Disp: , Rfl:    Omega-3 Fatty Acids (FISH OIL PO), Take 1 capsule by mouth daily., Disp: , Rfl:    omeprazole  (PRILOSEC ) 40 MG capsule, Take 1 capsule (40 mg total) by mouth daily., Disp: 90 capsule, Rfl: 3   PARoxetine  (PAXIL ) 10 MG tablet, TAKE 1 TABLET BY MOUTH EVERY DAY, Disp: 90 tablet, Rfl: 1   Sennosides (SENNA) 8.6 MG CAPS, Take 1 capsule by mouth daily as needed., Disp: , Rfl:    tirzepatide  (MOUNJARO ) 15 MG/0.5ML Pen, Inject 30 mg into the skin once a week., Disp: , Rfl:    valACYclovir  (VALTREX ) 1000 MG tablet, Take 1 tablet (1,000 mg total) by mouth every 12 (twelve) hours for 15 days., Disp: 30 tablet, Rfl: 0   Medications  ordered in this encounter:  Meds ordered this encounter  Medications   valACYclovir  (VALTREX ) 1000 MG tablet    Sig: Take 1 tablet (1,000 mg total) by mouth every 12 (twelve) hours for 15 days.    Dispense:  30 tablet    Refill:  0     *If you need refills on other medications prior to your next appointment, please contact your pharmacy*  Follow-Up: Call back or seek an in-person evaluation if the symptoms worsen or if the condition fails to improve as anticipated.  [facility] [phone]  Other Instructions   If you have been instructed to have an in-person evaluation today at a local Urgent Care facility, please use the link below. It will take you to a list of all of our available [facility], including address, phone number and hours of operation. Please do not delay care.  [facility]  If you or a family member do not have a primary care provider, use the link below to schedule a visit and establish care. When you choose a [facility] primary care physician or advanced practice provider, you gain a long-term partner in health. Find a Primary Care Provider  Learn more about [facility]'s in-office and virtual care options: [facility] - Get Care Now

## 2024-07-29 ENCOUNTER — Encounter: Payer: Self-pay | Admitting: Nurse Practitioner

## 2024-07-29 ENCOUNTER — Ambulatory Visit: Payer: Self-pay | Admitting: Nurse Practitioner

## 2024-07-29 VITALS — BP 120/80 | HR 73 | Temp 98.4°F | Ht 65.0 in | Wt 143.0 lb

## 2024-07-29 DIAGNOSIS — B009 Herpesviral infection, unspecified: Secondary | ICD-10-CM

## 2024-07-29 DIAGNOSIS — E119 Type 2 diabetes mellitus without complications: Secondary | ICD-10-CM

## 2024-07-29 DIAGNOSIS — Z139 Encounter for screening, unspecified: Secondary | ICD-10-CM

## 2024-07-29 DIAGNOSIS — E782 Mixed hyperlipidemia: Secondary | ICD-10-CM

## 2024-07-29 DIAGNOSIS — Z0000 Encounter for general adult medical examination without abnormal findings: Secondary | ICD-10-CM

## 2024-07-29 DIAGNOSIS — Z23 Encounter for immunization: Secondary | ICD-10-CM

## 2024-07-29 DIAGNOSIS — E559 Vitamin D deficiency, unspecified: Secondary | ICD-10-CM

## 2024-07-29 DIAGNOSIS — Z79899 Other long term (current) drug therapy: Secondary | ICD-10-CM

## 2024-07-29 LAB — POCT URINALYSIS DIP (CLINITEK)
Bilirubin, UA: NEGATIVE
Blood, UA: NEGATIVE
Glucose, UA: NEGATIVE mg/dL
Ketones, POC UA: NEGATIVE mg/dL
Leukocytes, UA: NEGATIVE
Nitrite, UA: NEGATIVE
POC PROTEIN,UA: NEGATIVE
Spec Grav, UA: 1.015 (ref 1.010–1.025)
Urobilinogen, UA: 0.2 U/dL
pH, UA: 6 (ref 5.0–8.0)

## 2024-07-29 MED ORDER — MOUNJARO 5 MG/0.5ML ~~LOC~~ SOAJ: 5.0000 mg | SUBCUTANEOUS | 0 refills | Status: AC

## 2024-07-29 MED ORDER — VALACYCLOVIR HCL 1 G PO TABS: 1000.0000 mg | ORAL_TABLET | Freq: Two times a day (BID) | ORAL | 0 refills | Status: AC

## 2024-07-29 NOTE — Progress Notes (Signed)
 LILLETTE Kristeen JINNY Gladis, CMA,acting as a neurosurgeon for Gaines Ada, FNP.,have documented all relevant documentation on the behalf of Gaines Ada, FNP,as directed by  Gaines Ada, FNP while in the presence of Gaines Ada, FNP.  Subjective:    Patient ID: Yvonne [name] , female    DOB: 1971-02-13 , 53 y.o.   MRN: [id number]  Chief Complaint  Patient presents with   Annual Exam    Patient presents today for HM, Patient reports compliance with medication. Patient denies any chest pain, SOB, or headaches. Patient has no concerns today.   Discussed the use of AI scribe software for clinical note transcription with the patient, who gave verbal consent to proceed.  History of Present Illness Yvonne [name] is a 53 year old female with diabetes who presents for a follow-up visit.  Her blood sugar was last checked over a month ago, with a result of 101 mg/dL. She has not checked it recently. Her appetite is low, and she often has to remind herself to eat. She is on a medication regimen that includes tirzepatide , administered at 95 units. Her weight has decreased by about 14 pounds since June.  She is scheduled for right shoulder surgery on December 15th for a rotator cuff issue. Surgery was delayed due to insurance processing issues. She experiences limitations in physical activity due to her shoulder and knee issues.  She has a history of plantar fasciitis and avoids walking barefoot. No swelling in her feet or ankles is reported, but she mentions knee issues.  She requires a refill of Valtrex  due to more frequent herpes outbreaks, which she attributes to stress. She also has a history of hysterectomy in 2012.  In her social history, she has two teenage boys. Her brother, who is a financial risk analyst, was recently diagnosed with cancer, impacting her ability to cook for her family.   She is on tirzepatide  from compounding company -  She continues with Repatha   Diabetes She presents for her follow-up diabetic visit.  She has type 2 diabetes mellitus. Her disease course has been stable. There are no hypoglycemic associated symptoms. Pertinent negatives for hypoglycemia include no confusion, dizziness or headaches. There are no diabetic associated symptoms. Pertinent negatives for diabetes include no chest pain. There are no hypoglycemic complications. There are no diabetic complications. Risk factors for coronary artery disease include diabetes mellitus. She is following a generally healthy diet. When asked about meal planning, she reported none. She has not had a previous visit with a dietitian. (She is not checking her blood sugar. )  Hyperlipidemia Pertinent negatives include no chest pain.     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 nephew   Colon cancer Neg Hx    Breast cancer Neg Hx    Colon polyps Neg Hx    Esophageal cancer Neg Hx    Rectal cancer Neg Hx      Current Outpatient Medications:    Adapalene 0.3 % gel, Apply 1 application topically as needed (zits). , Disp: , Rfl:    atorvastatin  (LIPITOR) 40 MG tablet, Take 1 tablet (40 mg total) by mouth daily., Disp: 90 tablet, Rfl: 2   celecoxib (CELEBREX) 100 MG capsule, Take 100 mg by mouth  daily., Disp: , Rfl:    Evolocumab  (REPATHA  SURECLICK) 140 MG/ML SOAJ, Inject 140 mg into the skin every 14 (fourteen) days., Disp: 6 mL, Rfl: 2   Multiple Vitamins-Minerals (CENTRUM ADULTS PO), Take 1 tablet by mouth daily., Disp: , Rfl:    Omega-3 Fatty Acids (FISH OIL PO), Take 1 capsule by mouth daily., Disp: , Rfl:    PARoxetine  (PAXIL ) 10 MG tablet, TAKE 1 TABLET BY MOUTH EVERY DAY, Disp: 90 tablet, Rfl: 1   Sennosides (SENNA) 8.6 MG CAPS, Take 1 capsule by mouth daily as needed., Disp: , Rfl:    tirzepatide  (MOUNJARO ) 15 MG/0.5ML Pen, Inject 30 mg into the skin once a week., Disp: , Rfl:    tirzepatide  (MOUNJARO ) 5 MG/0.5ML Pen, Inject 5 mg into the skin once a week., Disp: 2 mL, Rfl: 0   traMADol  (ULTRAM ) 50 MG tablet, Take 50 mg by mouth daily as needed., Disp: , Rfl:    Melatonin 5 MG CHEW, Chew by mouth as needed. (Patient not taking: Reported on 07/29/2024), Disp: , Rfl:    MELOXICAM PO, Take 15 mg by mouth every 6 (six) hours as needed. (Patient not taking: Reported on 07/29/2024), Disp: , Rfl:    omeprazole  (PRILOSEC ) 40 MG capsule, Take 1 capsule (40 mg total) by mouth daily. (Patient not taking: Reported on 07/29/2024), Disp: 90 capsule, Rfl: 3   valACYclovir  (VALTREX ) 1000 MG tablet, Take 1 tablet (1,000 mg total) by mouth every 12 (twelve) hours for 15 days., Disp: 30 tablet, Rfl: 0   Allergies  Allergen Reactions   Statins Other (See Comments)    myalgias   Sulfa Antibiotics Itching      The patient states she uses status post hysterectomy for birth control. Patient's last menstrual period was 05/25/2012. Negative for: breast discharge, breast lump(s), breast pain and breast self exam. Associated symptoms include abnormal vaginal bleeding. Pertinent negatives include abnormal bleeding (hematology), anxiety, decreased libido, depression, difficulty falling sleep, dyspareunia, history of infertility, nocturia, sexual dysfunction, sleep disturbances, urinary incontinence, urinary  urgency, vaginal discharge and vaginal itching. Diet regular; she continues on the tirzeptide and is planning to wean down. The patient states her exercise level is minimal - she has shoulder pain and is to have shoulder surgery on right arm on December 15th.  The patient's tobacco use is:  Social History   Tobacco Use  Smoking Status Never  Smokeless Tobacco Never   She has been exposed to passive smoke. The patient's alcohol use is:  Social History   Substance and Sexual Activity  Alcohol Use Yes   Alcohol/week: 6.0 standard drinks of alcohol   Types: 6 Glasses of wine per week   Comment: wine daily    Review of Systems  Constitutional: Negative.   HENT: Negative.    Eyes: Negative.  Respiratory: Negative.    Cardiovascular: Negative.  Negative for chest pain.  Gastrointestinal: Negative.   Endocrine: Negative.   Genitourinary: Negative.   Musculoskeletal: Negative.   Skin: Negative.   Allergic/Immunologic: Negative.   Neurological: Negative.  Negative for dizziness and headaches.  Hematological: Negative.   Psychiatric/Behavioral: Negative.  Negative for confusion.      Today's Vitals   07/29/24 1145  BP: 120/80  Pulse: 73  Temp: 98.4 F (36.9 C)  TempSrc: Oral  Weight: 143 lb (64.9 kg)  Height: 5' 5 (1.651 m)  PainSc: 3   PainLoc: Knee   Body mass index is 23.8 kg/m.  Wt Readings from Last 3 Encounters:  07/29/24 143 lb (64.9 kg)  04/29/24 152 lb (68.9 kg)  02/16/24 157 lb (71.2 kg)     Objective:  Physical Exam Vitals and nursing note reviewed.  Constitutional:      General: She is not in acute distress.    Appearance: Normal appearance. She is well-developed.  HENT:     Head: Normocephalic and atraumatic.     Right Ear: Hearing, tympanic membrane, ear canal and external ear normal. There is no impacted cerumen.     Left Ear: Hearing, tympanic membrane, ear canal and external ear normal. There is no impacted cerumen.     Nose: Nose normal.      Mouth/Throat:     Mouth: Mucous membranes are moist.  Eyes:     General: Lids are normal.     Extraocular Movements: Extraocular movements intact.     Conjunctiva/sclera: Conjunctivae normal.     Pupils: Pupils are equal, round, and reactive to light.     Funduscopic exam:    Right eye: No papilledema.        Left eye: No papilledema.  Neck:     Thyroid : No thyroid  mass.     Vascular: No carotid bruit.  Cardiovascular:     Rate and Rhythm: Normal rate and regular rhythm.     Pulses: Normal pulses.     Heart sounds: Normal heart sounds. No murmur heard. Pulmonary:     Effort: Pulmonary effort is normal. No respiratory distress.     Breath sounds: Normal breath sounds. No wheezing.  Abdominal:     General: Abdomen is flat. Bowel sounds are normal. There is no distension.     Palpations: Abdomen is soft.     Tenderness: There is no abdominal tenderness.  Musculoskeletal:        General: No swelling or tenderness. Normal range of motion.     Cervical back: Full passive range of motion without pain, normal range of motion and neck supple.     Right lower leg: No edema.     Left lower leg: No edema.  Feet:     Left foot:     Protective Sensation: 4 sites tested.    Skin:    General: Skin is warm and dry.     Capillary Refill: Capillary refill takes less than 2 seconds.  Neurological:     General: No focal deficit present.     Mental Status: She is alert and oriented to person, place, and time.     Cranial Nerves: No cranial nerve deficit.     Sensory: No sensory deficit.     Motor: No weakness.  Psychiatric:        Mood and Affect: Mood normal.        Behavior: Behavior normal.        Thought Content:  Thought content normal.        Judgment: Judgment normal.      Diabetic foot exam was performed with the following findings:   No deformities, ulcerations, or other skin breakdown Normal sensation of 10g monofilament Intact posterior tibialis and dorsalis pedis pulses      Assessment And Plan:     Encounter for annual health examination Assessment & Plan: Behavior modifications discussed and diet history reviewed.   Pt will continue to exercise regularly and modify diet with low GI, plant based foods and decrease intake of processed foods.  Recommend intake of daily multivitamin, Vitamin D , and calcium .  Recommend mammogram and colonoscopy for preventive screenings, as well as recommend immunizations that include influenza, TDAP, and Shingles    Type 2 diabetes mellitus without complication, without long-term current use of insulin (HCC) Assessment & Plan: Blood sugar was 101 mg/dL over a month ago. Discussed potential insurance coverage for Mounjaro  and titration process. Current medication is a compounded version of tirzepatide . - Discussed potential insurance coverage for Mounjaro . - Monitor blood sugar levels.  Orders: -     POCT URINALYSIS DIP (CLINITEK) -     Microalbumin / creatinine urine ratio -     CMP14+EGFR -     Hemoglobin A1c -     Mounjaro ; Inject 5 mg into the skin once a week.  Dispense: 2 mL; Refill: 0  Mixed hyperlipidemia Assessment & Plan: Cholesterol levels are improving, continue Repatha .  Continue follow-up with cardiology.  Orders: -     Lipid panel  Vitamin D  deficiency Assessment & Plan: Will check vitamin D  level and supplement as needed.    Also encouraged to spend 15 minutes in the sun daily.     Herpes Assessment & Plan: Increased frequency of outbreaks, possibly due to stress. - Refilled Valtrex  prescription.  Orders: -     valACYclovir  HCl; Take 1 tablet (1,000 mg total) by mouth every 12 (twelve) hours for 15 days.  Dispense: 30 tablet; Refill: 0  Other long term (current) drug therapy -     CBC with Differential/Platelet  Encounter for screening -     Hepatitis B surface antibody,qualitative  BMI 23.0-23.9, adult Assessment & Plan: Weight loss of 14 pounds since June. Discussed insurance  coverage for weight management medications. Current medication is a compounded version of tirzepatide . - Discussed potential insurance coverage for Mounjaro .   Nontraumatic incomplete tear of right rotator cuff Assessment & Plan: Planned surgery on December 15th. One of the four tendons has 25% remaining. Surgery necessary to prevent further deterioration. - Proceed with planned shoulder surgery on December 15th.   Pain in left foot Assessment & Plan: Chronic plantar fasciitis. Advised against walking barefoot, especially in the morning. - Continue to avoid walking barefoot, especially in the morning.      Return for 1 year physical, controlled DM check 4 months. Patient was given opportunity to ask questions. Patient verbalized understanding of the plan and was able to repeat key elements of the plan. All questions were answered to their satisfaction.   Gaines Ada, FNP I, Gaines Ada, FNP, have reviewed all documentation for this visit. The documentation on 07/29/2024 for the exam, diagnosis, procedures, and orders are all accurate and complete.

## 2024-07-30 LAB — CMP14+EGFR
ALT: 16 IU/L (ref 0–32)
AST: 18 IU/L (ref 0–40)
Albumin: 4.7 g/dL (ref 3.8–4.9)
Alkaline Phosphatase: 68 IU/L (ref 49–135)
BUN/Creatinine Ratio: 14 (ref 9–23)
BUN: 10 mg/dL (ref 6–24)
Bilirubin Total: 0.5 mg/dL (ref 0.0–1.2)
CO2: 24 mmol/L (ref 20–29)
Calcium: 10 mg/dL (ref 8.7–10.2)
Chloride: 101 mmol/L (ref 96–106)
Creatinine, Ser: 0.74 mg/dL (ref 0.57–1.00)
Globulin, Total: 1.9 g/dL (ref 1.5–4.5)
Glucose: 76 mg/dL (ref 70–99)
Potassium: 4.6 mmol/L (ref 3.5–5.2)
Sodium: 139 mmol/L (ref 134–144)
Total Protein: 6.6 g/dL (ref 6.0–8.5)
eGFR: 95 mL/min/1.73 (ref >59)

## 2024-07-30 LAB — CBC WITH DIFFERENTIAL/PLATELET
Basophils Absolute: 0 x10E3/uL (ref 0.0–0.2)
Basos: 1 %
EOS (ABSOLUTE): 0 x10E3/uL (ref 0.0–0.4)
Eos: 1 %
Hematocrit: 42.8 % (ref 34.0–46.6)
Hemoglobin: 14.7 g/dL (ref 11.1–15.9)
Immature Grans (Abs): 0 x10E3/uL (ref 0.0–0.1)
Immature Granulocytes: 0 %
Lymphocytes Absolute: 1.1 x10E3/uL (ref 0.7–3.1)
Lymphs: 33 %
MCH: 32 pg (ref 26.6–33.0)
MCHC: 34.3 g/dL (ref 31.5–35.7)
MCV: 93 fL (ref 79–97)
Monocytes Absolute: 0.3 x10E3/uL (ref 0.1–0.9)
Monocytes: 9 %
Neutrophils Absolute: 1.9 x10E3/uL (ref 1.4–7.0)
Neutrophils: 56 %
Platelets: 308 x10E3/uL (ref 150–450)
RBC: 4.59 x10E6/uL (ref 3.77–5.28)
RDW: 12.9 % (ref 11.7–15.4)
WBC: 3.3 x10E3/uL — ABNORMAL LOW (ref 3.4–10.8)

## 2024-07-30 LAB — MICROALBUMIN / CREATININE URINE RATIO
Creatinine, Urine: 53.5 mg/dL
Microalb/Creat Ratio: 6 mg/g{creat} (ref 0–29)
Microalbumin, Urine: 3.2 ug/mL

## 2024-07-30 LAB — LIPID PANEL
Chol/HDL Ratio: 2.1 ratio (ref 0.0–4.4)
Cholesterol, Total: 163 mg/dL (ref 100–199)
HDL: 79 mg/dL (ref >39)
LDL Chol Calc (NIH): 67 mg/dL (ref 0–99)
Triglycerides: 91 mg/dL (ref 0–149)
VLDL Cholesterol Cal: 17 mg/dL (ref 5–40)

## 2024-07-30 LAB — HEMOGLOBIN A1C
Est. average glucose Bld gHb Est-mCnc: 105 mg/dL
Hgb A1c MFr Bld: 5.3 % (ref 4.8–5.6)

## 2024-07-30 LAB — HEPATITIS B SURFACE ANTIBODY,QUALITATIVE: Hep B Surface Ab, Qual: NONREACTIVE

## 2024-08-05 DIAGNOSIS — G8929 Other chronic pain: Secondary | ICD-10-CM | POA: Diagnosis not present

## 2024-08-05 DIAGNOSIS — M25562 Pain in left knee: Secondary | ICD-10-CM | POA: Diagnosis not present

## 2024-08-08 ENCOUNTER — Ambulatory Visit: Payer: Self-pay | Admitting: Nurse Practitioner

## 2024-08-08 DIAGNOSIS — M75111 Incomplete rotator cuff tear or rupture of right shoulder, not specified as traumatic: Secondary | ICD-10-CM | POA: Insufficient documentation

## 2024-08-08 DIAGNOSIS — Z23 Encounter for immunization: Secondary | ICD-10-CM | POA: Insufficient documentation

## 2024-08-08 DIAGNOSIS — Z6823 Body mass index (BMI) 23.0-23.9, adult: Secondary | ICD-10-CM | POA: Insufficient documentation

## 2024-08-08 DIAGNOSIS — B009 Herpesviral infection, unspecified: Secondary | ICD-10-CM | POA: Insufficient documentation

## 2024-08-08 NOTE — Assessment & Plan Note (Signed)
Cholesterol levels are improving, continue Repatha.  Continue follow-up with cardiology.

## 2024-08-08 NOTE — Assessment & Plan Note (Signed)
 Increased frequency of outbreaks, possibly due to stress. - Refilled Valtrex  prescription.

## 2024-08-08 NOTE — Assessment & Plan Note (Signed)
 Will check vitamin D  level and supplement as needed.    Also encouraged to spend 15 minutes in the sun daily.

## 2024-08-08 NOTE — Assessment & Plan Note (Signed)
 Blood sugar was 101 mg/dL over a month ago. Discussed potential insurance coverage for Mounjaro  and titration process. Current medication is a compounded version of tirzepatide . - Discussed potential insurance coverage for Mounjaro . - Monitor blood sugar levels.

## 2024-08-08 NOTE — Assessment & Plan Note (Signed)
 Planned surgery on December 15th. One of the four tendons has 25% remaining. Surgery necessary to prevent further deterioration. - Proceed with planned shoulder surgery on December 15th.

## 2024-08-08 NOTE — Assessment & Plan Note (Signed)
 Weight loss of 14 pounds since June. Discussed insurance coverage for weight management medications. Current medication is a compounded version of tirzepatide . - Discussed potential insurance coverage for Mounjaro .

## 2024-08-08 NOTE — Assessment & Plan Note (Signed)
 Chronic plantar fasciitis. Advised against walking barefoot, especially in the morning. - Continue to avoid walking barefoot, especially in the morning.

## 2024-08-08 NOTE — Assessment & Plan Note (Signed)

## 2024-08-09 DIAGNOSIS — S46211A Strain of muscle, fascia and tendon of other parts of biceps, right arm, initial encounter: Secondary | ICD-10-CM | POA: Diagnosis not present

## 2024-08-09 DIAGNOSIS — M24111 Other articular cartilage disorders, right shoulder: Secondary | ICD-10-CM | POA: Diagnosis not present

## 2024-08-09 DIAGNOSIS — S46212A Strain of muscle, fascia and tendon of other parts of biceps, left arm, initial encounter: Secondary | ICD-10-CM | POA: Diagnosis not present

## 2024-08-09 DIAGNOSIS — Y999 Unspecified external cause status: Secondary | ICD-10-CM | POA: Diagnosis not present

## 2024-08-09 DIAGNOSIS — M7552 Bursitis of left shoulder: Secondary | ICD-10-CM | POA: Diagnosis not present

## 2024-08-09 DIAGNOSIS — G8918 Other acute postprocedural pain: Secondary | ICD-10-CM | POA: Diagnosis not present

## 2024-08-09 DIAGNOSIS — M778 Other enthesopathies, not elsewhere classified: Secondary | ICD-10-CM | POA: Diagnosis not present

## 2024-08-09 DIAGNOSIS — M65811 Other synovitis and tenosynovitis, right shoulder: Secondary | ICD-10-CM | POA: Diagnosis not present

## 2024-08-09 DIAGNOSIS — S46011A Strain of muscle(s) and tendon(s) of the rotator cuff of right shoulder, initial encounter: Secondary | ICD-10-CM | POA: Diagnosis not present

## 2024-08-09 DIAGNOSIS — S46012A Strain of muscle(s) and tendon(s) of the rotator cuff of left shoulder, initial encounter: Secondary | ICD-10-CM | POA: Diagnosis not present

## 2024-08-09 DIAGNOSIS — S4382XA Sprain of other specified parts of left shoulder girdle, initial encounter: Secondary | ICD-10-CM | POA: Diagnosis not present

## 2024-08-09 DIAGNOSIS — X58XXXA Exposure to other specified factors, initial encounter: Secondary | ICD-10-CM | POA: Diagnosis not present

## 2024-08-09 DIAGNOSIS — M7541 Impingement syndrome of right shoulder: Secondary | ICD-10-CM | POA: Diagnosis not present

## 2024-08-09 DIAGNOSIS — S43491A Other sprain of right shoulder joint, initial encounter: Secondary | ICD-10-CM | POA: Diagnosis not present

## 2024-08-09 DIAGNOSIS — M7501 Adhesive capsulitis of right shoulder: Secondary | ICD-10-CM | POA: Diagnosis not present

## 2024-08-09 DIAGNOSIS — M65812 Other synovitis and tenosynovitis, left shoulder: Secondary | ICD-10-CM | POA: Diagnosis not present

## 2024-08-31 DIAGNOSIS — Z1231 Encounter for screening mammogram for malignant neoplasm of breast: Secondary | ICD-10-CM

## 2024-09-23 ENCOUNTER — Ambulatory Visit
Admission: RE | Admit: 2024-09-23 | Discharge: 2024-09-23 | Disposition: A | Discharge status: Home / Self Care | Source: Ambulatory Visit | Attending: Obstetrics and Gynecology | Admitting: Obstetrics and Gynecology

## 2024-09-23 DIAGNOSIS — Z1231 Encounter for screening mammogram for malignant neoplasm of breast: Secondary | ICD-10-CM

## 2024-09-25 ENCOUNTER — Encounter: Payer: Self-pay | Admitting: Nurse Practitioner

## 2024-09-27 ENCOUNTER — Other Ambulatory Visit: Payer: Self-pay

## 2024-09-27 MED ORDER — VALACYCLOVIR HCL 1 G PO TABS: 1000.0000 mg | ORAL_TABLET | Freq: Two times a day (BID) | ORAL | 1 refills | Status: AC

## 2024-09-28 ENCOUNTER — Other Ambulatory Visit: Payer: Self-pay | Admitting: Obstetrics and Gynecology

## 2024-09-28 DIAGNOSIS — R928 Other abnormal and inconclusive findings on diagnostic imaging of breast: Secondary | ICD-10-CM

## 2024-10-07 ENCOUNTER — Encounter

## 2024-10-07 ENCOUNTER — Other Ambulatory Visit

## 2024-11-29 ENCOUNTER — Ambulatory Visit: Admitting: Nurse Practitioner

## 2025-08-01 ENCOUNTER — Encounter: Payer: Self-pay | Admitting: Nurse Practitioner
